# Patient Record
Sex: Female | Born: 1935
Health system: Southern US, Community
[De-identification: ages and names within clinical notes are randomized; demographics above are authoritative.]

## PROBLEM LIST (undated history)

## (undated) DIAGNOSIS — E079 Disorder of thyroid, unspecified: Secondary | ICD-10-CM

## (undated) DIAGNOSIS — T7840XA Allergy, unspecified, initial encounter: Secondary | ICD-10-CM

## (undated) DIAGNOSIS — I10 Essential (primary) hypertension: Secondary | ICD-10-CM

## (undated) DIAGNOSIS — I160 Hypertensive urgency: Secondary | ICD-10-CM

## (undated) DIAGNOSIS — E119 Type 2 diabetes mellitus without complications: Secondary | ICD-10-CM

## (undated) DIAGNOSIS — E785 Hyperlipidemia, unspecified: Secondary | ICD-10-CM

## (undated) HISTORY — DX: Allergy, unspecified, initial encounter: T78.40XA

## (undated) HISTORY — DX: Disorder of thyroid, unspecified: E07.9

## (undated) HISTORY — DX: Hypertensive urgency: I16.0

## (undated) HISTORY — DX: Type 2 diabetes mellitus without complications: E11.9

## (undated) HISTORY — DX: Hyperlipidemia, unspecified: E78.5

## (undated) HISTORY — DX: Essential (primary) hypertension: I10

---

## 2003-03-31 HISTORY — PX: BUNIONECTOMY: SHX129

## 2016-03-10 LAB — LIPID PANEL
Cholesterol: 144 (ref 0–200)
HDL: 49 (ref 35–70)
LDL Cholesterol: 79
Triglycerides: 82 (ref 40–160)

## 2016-03-10 LAB — BASIC METABOLIC PANEL
Creatinine: 0.7 (ref 0.5–1.1)
Glucose: 116
Potassium: 4.6 (ref 3.4–5.3)
Sodium: 145 (ref 137–147)

## 2016-03-10 LAB — CBC AND DIFFERENTIAL
HCT: 42 (ref 36–46)
Hemoglobin: 12.9 (ref 12.0–16.0)
WBC: 4.7

## 2016-03-10 LAB — HEPATIC FUNCTION PANEL
ALT: 20 (ref 7–35)
AST: 28 (ref 13–35)
Alkaline Phosphatase: 115 (ref 25–125)
Bilirubin, Total: 0.7

## 2016-03-10 LAB — HEMOGLOBIN A1C: Hemoglobin A1C: 7

## 2018-05-02 DIAGNOSIS — E559 Vitamin D deficiency, unspecified: Secondary | ICD-10-CM | POA: Diagnosis not present

## 2018-05-02 DIAGNOSIS — I10 Essential (primary) hypertension: Secondary | ICD-10-CM | POA: Diagnosis not present

## 2018-05-02 DIAGNOSIS — E782 Mixed hyperlipidemia: Secondary | ICD-10-CM | POA: Diagnosis not present

## 2018-05-02 DIAGNOSIS — E119 Type 2 diabetes mellitus without complications: Secondary | ICD-10-CM | POA: Diagnosis not present

## 2018-05-06 DIAGNOSIS — E119 Type 2 diabetes mellitus without complications: Secondary | ICD-10-CM | POA: Diagnosis not present

## 2018-05-06 DIAGNOSIS — I10 Essential (primary) hypertension: Secondary | ICD-10-CM | POA: Diagnosis not present

## 2018-05-07 DIAGNOSIS — R69 Illness, unspecified: Secondary | ICD-10-CM | POA: Diagnosis not present

## 2018-08-02 DIAGNOSIS — R69 Illness, unspecified: Secondary | ICD-10-CM | POA: Diagnosis not present

## 2018-09-29 DIAGNOSIS — R69 Illness, unspecified: Secondary | ICD-10-CM | POA: Diagnosis not present

## 2018-12-10 DIAGNOSIS — R69 Illness, unspecified: Secondary | ICD-10-CM | POA: Diagnosis not present

## 2018-12-14 DIAGNOSIS — H35033 Hypertensive retinopathy, bilateral: Secondary | ICD-10-CM | POA: Diagnosis not present

## 2018-12-14 DIAGNOSIS — H2513 Age-related nuclear cataract, bilateral: Secondary | ICD-10-CM | POA: Diagnosis not present

## 2018-12-14 DIAGNOSIS — E113393 Type 2 diabetes mellitus with moderate nonproliferative diabetic retinopathy without macular edema, bilateral: Secondary | ICD-10-CM | POA: Diagnosis not present

## 2018-12-14 DIAGNOSIS — H3589 Other specified retinal disorders: Secondary | ICD-10-CM | POA: Diagnosis not present

## 2018-12-24 ENCOUNTER — Encounter: Payer: Self-pay | Admitting: Internal Medicine

## 2018-12-28 ENCOUNTER — Other Ambulatory Visit: Payer: Self-pay

## 2018-12-28 ENCOUNTER — Encounter: Payer: Self-pay | Admitting: Internal Medicine

## 2018-12-28 ENCOUNTER — Ambulatory Visit (INDEPENDENT_AMBULATORY_CARE_PROVIDER_SITE_OTHER): Payer: Medicare HMO | Admitting: Internal Medicine

## 2018-12-28 VITALS — BP 132/88 | HR 58 | Ht 64.0 in | Wt 161.0 lb

## 2018-12-28 DIAGNOSIS — I10 Essential (primary) hypertension: Secondary | ICD-10-CM | POA: Insufficient documentation

## 2018-12-28 DIAGNOSIS — E559 Vitamin D deficiency, unspecified: Secondary | ICD-10-CM | POA: Diagnosis not present

## 2018-12-28 DIAGNOSIS — E782 Mixed hyperlipidemia: Secondary | ICD-10-CM | POA: Diagnosis not present

## 2018-12-28 DIAGNOSIS — R7303 Prediabetes: Secondary | ICD-10-CM

## 2018-12-28 DIAGNOSIS — E119 Type 2 diabetes mellitus without complications: Secondary | ICD-10-CM | POA: Insufficient documentation

## 2018-12-28 DIAGNOSIS — E1169 Type 2 diabetes mellitus with other specified complication: Secondary | ICD-10-CM | POA: Insufficient documentation

## 2018-12-28 DIAGNOSIS — Z1231 Encounter for screening mammogram for malignant neoplasm of breast: Secondary | ICD-10-CM

## 2018-12-28 DIAGNOSIS — E785 Hyperlipidemia, unspecified: Secondary | ICD-10-CM | POA: Insufficient documentation

## 2018-12-28 DIAGNOSIS — E118 Type 2 diabetes mellitus with unspecified complications: Secondary | ICD-10-CM | POA: Insufficient documentation

## 2018-12-28 DIAGNOSIS — Z23 Encounter for immunization: Secondary | ICD-10-CM

## 2018-12-28 NOTE — Progress Notes (Signed)
Date:  12/28/2018   Name:  Nicci Vaughan   DOB:  1936/01/20   MRN:  [id number]   Chief Complaint: Establish Care (HIGH DOSE FLU SHOT.) and Alopecia (Wants to know if any of her meds can cause hair loss and facial hair growth. )  Hypertension This is a chronic problem. Episode onset: 15 years. The problem is controlled. Pertinent negatives include no chest pain, headaches, orthopnea, palpitations, peripheral edema or shortness of breath. Risk factors for coronary artery disease include dyslipidemia. Past treatments include central alpha agonists, alpha 1 blockers, angiotensin blockers and beta blockers. The current treatment provides significant improvement.  Hyperlipidemia The problem is controlled. Pertinent negatives include no chest pain or shortness of breath. Current antihyperlipidemic treatment includes statins.  Diabetes She presents for her follow-up diabetic visit. Diabetes type: pre-diabetes. Her disease course has been stable. Pertinent negatives for hypoglycemia include no dizziness, headaches or nervousness/anxiousness. Pertinent negatives for diabetes include no chest pain, no fatigue and no weakness. Current diabetic treatment includes oral agent (monotherapy) (metformin). She is following a generally healthy diet. She monitors blood glucose at home 1-2 x per day. Her breakfast blood glucose is taken between 6-7 am. Her breakfast blood glucose range is generally 90-110 mg/dl. Her dinner blood glucose is taken between 7-8 pm. Her dinner blood glucose range is generally 140-180 mg/dl. An ACE inhibitor/angiotensin II receptor blocker is being taken.  Knee Pain  The pain is present in the left knee and right knee. The quality of the pain is described as aching. The pain is moderate. She has tried acetaminophen (and synovisc injections last year) for the symptoms. The treatment provided moderate relief.  Hair growth - she has unwanted hair growth on her chin that has been present for  years.  She wonders if any of her medication is contributing.  She also has thinning on the top of her head but no patchy hair loss.  Review of Systems  Constitutional: Negative for chills, fatigue, fever and unexpected weight change.  HENT: Negative for trouble swallowing.   Eyes: Negative for visual disturbance.  Respiratory: Negative for chest tightness and shortness of breath.   Cardiovascular: Negative for chest pain, palpitations and orthopnea.  Gastrointestinal: Negative for abdominal pain, constipation and diarrhea.  Musculoskeletal: Positive for arthralgias (right knee at times).  Skin: Negative for color change and rash.  Neurological: Negative for dizziness, weakness, light-headedness and headaches.  Psychiatric/Behavioral: Negative for dysphoric mood and sleep disturbance. The patient is not nervous/anxious.     Patient Active Problem List   Diagnosis Date Noted  . Prediabetes 12/28/2018  . Essential hypertension 12/28/2018  . Hyperlipidemia 12/28/2018    No Known Allergies  Past Surgical History:  Procedure Laterality Date  . BUNIONECTOMY Bilateral 2005    Social History   Tobacco Use  . Smoking status: Never Smoker  . Smokeless tobacco: Never Used  Substance Use Topics  . Alcohol use: Never    Frequency: Never  . Drug use: Never     Medication list has been reviewed and updated.  Current Meds  Medication Sig  . aspirin EC 81 MG tablet Take 81 mg by mouth daily.  Marland Kitchen atorvastatin (LIPITOR) 10 MG tablet Take 10 mg by mouth daily.  . calcium-vitamin D (OSCAL WITH D) 500-200 MG-UNIT tablet Take 1 tablet by mouth.  . cloNIDine (CATAPRES) 0.2 MG tablet Take 0.2 mg by mouth 2 (two) times daily.   Marland Kitchen doxazosin (CARDURA) 4 MG tablet Take 4 mg by mouth daily.  Marland Kitchen  Lancets (ONETOUCH DELICA PLUS [id number]) MISC   . Magnesium 500 MG CAPS Take by mouth.  . metFORMIN (GLUCOPHAGE) 500 MG tablet Take 500 mg by mouth 2 (two) times daily with a meal.   . metoprolol  succinate (TOPROL-XL) 100 MG 24 hr tablet Take 100 mg by mouth daily.  Letta Pate VERIO test strip   . telmisartan (MICARDIS) 80 MG tablet Take 80 mg by mouth daily.    PHQ 2/9 Scores 12/28/2018  PHQ - 2 Score 0    BP Readings from Last 3 Encounters:  12/28/18 132/88    Physical Exam Vitals signs and nursing note reviewed.  Constitutional:      General: She is not in acute distress.    Appearance: Normal appearance. She is well-developed.  HENT:     Head: Normocephalic and atraumatic.  Eyes:     Pupils: Pupils are equal, round, and reactive to light.  Neck:     Musculoskeletal: Normal range of motion.     Vascular: No carotid bruit.  Cardiovascular:     Rate and Rhythm: Normal rate and regular rhythm.     Pulses: Normal pulses.     Heart sounds: No murmur.  Pulmonary:     Effort: Pulmonary effort is normal. No respiratory distress.     Breath sounds: Normal breath sounds. No wheezing.  Abdominal:     General: Abdomen is flat. There is no distension.     Palpations: Abdomen is soft. There is no mass.     Tenderness: There is no abdominal tenderness.  Musculoskeletal:     Right knee: She exhibits normal range of motion, no swelling and no effusion.     Left knee: She exhibits normal range of motion, no swelling and no effusion.     Right lower leg: No edema.     Left lower leg: No edema.  Lymphadenopathy:     Cervical: No cervical adenopathy.  Skin:    General: Skin is warm and dry.     Capillary Refill: Capillary refill takes less than 2 seconds.     Findings: No rash.     Comments: Moderate coarse hair growth on chin  Neurological:     General: No focal deficit present.     Mental Status: She is alert and oriented to person, place, and time.     Sensory: Sensation is intact.     Motor: Motor function is intact.     Gait: Gait is intact. Gait normal.  Psychiatric:        Attention and Perception: Attention normal.        Mood and Affect: Mood normal.         Behavior: Behavior normal.        Thought Content: Thought content normal.        Cognition and Memory: Cognition normal.     Wt Readings from Last 3 Encounters:  12/28/18 161 lb (73 kg)    BP 132/88   Pulse (!) 58   Ht 5\' 4"  (1.626 m)   Wt 161 lb (73 kg)   SpO2 98%   BMI 27.64 kg/m   Assessment and Plan: 1. Prediabetes On metformin without side effects A1C never > 6 so pt hold metformin if glucose is < 100 - Hemoglobin A1c  2. Essential hypertension Clinically stable exam with well controlled BP.   Tolerating medications, metoprolol, telmisartan, cardura and clonidine, without side effects at this time. Pt to continue current regimen and low sodium diet; benefits  of regular exercise as able discussed. - CBC with Differential/Platelet - Comprehensive metabolic panel - TSH  3. Mixed hyperlipidemia On moderate intensity statin for primary prevention of CAD Tolerating medication without myalgia, etc - Lipid panel  4. Encounter for screening mammogram for breast cancer [facility] her - MM 3D SCREEN BREAST BILATERAL; Future  5. Vitamin D deficiency Continue supplementation [place]  6. Need for immunization against influenza - Flu Vaccine QUAD High Dose(Fluad)   Partially dictated using Animal nutritionistDragon software. Any errors are unintentional.  Bari EdwardLaura Nadja Lina, MD [facility]  12/28/2018

## 2018-12-29 ENCOUNTER — Encounter: Payer: Self-pay | Admitting: Internal Medicine

## 2018-12-29 LAB — CBC WITH DIFFERENTIAL/PLATELET
Basophils Absolute: 0.1 10*3/uL (ref 0.0–0.2)
Basos: 1 %
EOS (ABSOLUTE): 0.3 10*3/uL (ref 0.0–0.4)
Eos: 5 %
Hematocrit: 37.3 % (ref 34.0–46.6)
Hemoglobin: 12.5 g/dL (ref 11.1–15.9)
Immature Grans (Abs): 0 10*3/uL (ref 0.0–0.1)
Immature Granulocytes: 0 %
Lymphocytes Absolute: 2 10*3/uL (ref 0.7–3.1)
Lymphs: 33 %
MCH: 29.3 pg (ref 26.6–33.0)
MCHC: 33.5 g/dL (ref 31.5–35.7)
MCV: 87 fL (ref 79–97)
Monocytes Absolute: 0.5 10*3/uL (ref 0.1–0.9)
Monocytes: 8 %
Neutrophils Absolute: 3.2 10*3/uL (ref 1.4–7.0)
Neutrophils: 53 %
Platelets: 169 10*3/uL (ref 150–450)
RBC: 4.27 x10E6/uL (ref 3.77–5.28)
RDW: 13.1 % (ref 11.7–15.4)
WBC: 6.1 10*3/uL (ref 3.4–10.8)

## 2018-12-29 LAB — LIPID PANEL
Chol/HDL Ratio: 2.8 ratio (ref 0.0–4.4)
Cholesterol, Total: 152 mg/dL (ref 100–199)
HDL: 54 mg/dL (ref >39)
LDL Chol Calc (NIH): 84 mg/dL (ref 0–99)
Triglycerides: 74 mg/dL (ref 0–149)
VLDL Cholesterol Cal: 14 mg/dL (ref 5–40)

## 2018-12-29 LAB — COMPREHENSIVE METABOLIC PANEL
ALT: 5 IU/L (ref 0–32)
AST: 18 IU/L (ref 0–40)
Albumin/Globulin Ratio: 1.2 (ref 1.2–2.2)
Albumin: 4.5 g/dL (ref 3.6–4.6)
Alkaline Phosphatase: 87 IU/L (ref 39–117)
BUN/Creatinine Ratio: 14 (ref 12–28)
BUN: 14 mg/dL (ref 8–27)
Bilirubin Total: 0.6 mg/dL (ref 0.0–1.2)
CO2: 27 mmol/L (ref 20–29)
Calcium: 9.8 mg/dL (ref 8.7–10.3)
Chloride: 99 mmol/L (ref 96–106)
Creatinine, Ser: 1.01 mg/dL — ABNORMAL HIGH (ref 0.57–1.00)
GFR calc Af Amer: 60 mL/min/{1.73_m2} (ref >59)
GFR calc non Af Amer: 52 mL/min/{1.73_m2} — ABNORMAL LOW (ref >59)
Globulin, Total: 3.7 g/dL (ref 1.5–4.5)
Glucose: 110 mg/dL — ABNORMAL HIGH (ref 65–99)
Potassium: 4.4 mmol/L (ref 3.5–5.2)
Sodium: 139 mmol/L (ref 134–144)
Total Protein: 8.2 g/dL (ref 6.0–8.5)

## 2018-12-29 LAB — HEMOGLOBIN A1C
Est. average glucose Bld gHb Est-mCnc: 151 mg/dL
Hgb A1c MFr Bld: 6.9 % — ABNORMAL HIGH (ref 4.8–5.6)

## 2018-12-29 LAB — TSH: TSH: 1.9 u[IU]/mL (ref 0.450–4.500)

## 2019-02-01 ENCOUNTER — Inpatient Hospital Stay: Admission: RE | Admit: 2019-02-01 | Payer: Medicare HMO | Source: Ambulatory Visit

## 2019-06-27 ENCOUNTER — Ambulatory Visit (INDEPENDENT_AMBULATORY_CARE_PROVIDER_SITE_OTHER): Payer: Medicare HMO | Admitting: Internal Medicine

## 2019-06-27 ENCOUNTER — Encounter: Payer: Self-pay | Admitting: Internal Medicine

## 2019-06-27 ENCOUNTER — Other Ambulatory Visit: Payer: Self-pay

## 2019-06-27 VITALS — BP 142/98 | HR 51 | Temp 97.1°F | Ht 64.0 in | Wt 156.0 lb

## 2019-06-27 DIAGNOSIS — E782 Mixed hyperlipidemia: Secondary | ICD-10-CM

## 2019-06-27 DIAGNOSIS — Z0000 Encounter for general adult medical examination without abnormal findings: Secondary | ICD-10-CM

## 2019-06-27 DIAGNOSIS — Z1231 Encounter for screening mammogram for malignant neoplasm of breast: Secondary | ICD-10-CM | POA: Diagnosis not present

## 2019-06-27 DIAGNOSIS — E118 Type 2 diabetes mellitus with unspecified complications: Secondary | ICD-10-CM | POA: Diagnosis not present

## 2019-06-27 DIAGNOSIS — Z13820 Encounter for screening for osteoporosis: Secondary | ICD-10-CM

## 2019-06-27 DIAGNOSIS — I10 Essential (primary) hypertension: Secondary | ICD-10-CM

## 2019-06-27 LAB — POCT URINALYSIS DIPSTICK
Bilirubin, UA: NEGATIVE
Blood, UA: NEGATIVE
Glucose, UA: NEGATIVE
Ketones, UA: NEGATIVE
Nitrite, UA: NEGATIVE
Protein, UA: NEGATIVE
Spec Grav, UA: 1.02 (ref 1.010–1.025)
Urobilinogen, UA: 0.2 E.U./dL
pH, UA: 6 (ref 5.0–8.0)

## 2019-06-27 MED ORDER — ATORVASTATIN CALCIUM 10 MG PO TABS: 10.0000 mg | ORAL_TABLET | Freq: Every day | ORAL | 1 refills | Status: DC

## 2019-06-27 MED ORDER — CLONIDINE HCL 0.2 MG PO TABS: 0.2000 mg | ORAL_TABLET | Freq: Three times a day (TID) | ORAL | 1 refills | Status: DC

## 2019-06-27 MED ORDER — TELMISARTAN 80 MG PO TABS: 80.0000 mg | ORAL_TABLET | Freq: Every day | ORAL | 1 refills | Status: DC

## 2019-06-27 MED ORDER — METOPROLOL SUCCINATE ER 100 MG PO TB24: 100.0000 mg | ORAL_TABLET | Freq: Every day | ORAL | 1 refills | Status: DC

## 2019-06-27 NOTE — Progress Notes (Signed)
Date:  06/27/2019   Name:  Amber Padilla   DOB:  [birth year]   MRN:  [id number]   Chief Complaint: Annual Exam (declined breast exam- no pap- foot exam- med refill), Hypertension (last reading 144/81 Needs meds refilled. Says Doxazosine causes dry mouth/tongue, unable to speak clearly. Wants to know if this can be changed?), Diabetes (follow up ), and Hyperlipidemia (Med refill.) Amber Padilla is a 84 y.o. female who presents today for her Complete Annual Exam. She feels fairly well. She reports exercising walking some. She reports she is sleeping fairly well. She denies breast issues and declines breast exam.  Mammogram 2017 - report in chart Does not want any further. DEXA - pt thinks its almost due Colonoscopy - aged out Immunization History  Administered Date(s) Administered  . Fluad Quad(high Dose 65+) 12/28/2018  . PFIZER SARS-COV-2 Vaccination 05/25/2019, 06/15/2019    Hypertension This is a chronic problem. The problem is resistant (at home 144/81.). Pertinent negatives include no chest pain, headaches, palpitations or shortness of breath. Past treatments include angiotensin blockers, beta blockers, central alpha agonists and direct vasodilators. The current treatment provides significant improvement. There are no compliance problems (she thinks that Cardura causes dry mouth).   Diabetes She presents for her follow-up diabetic visit. She has type 2 diabetes mellitus. Her disease course has been stable. Pertinent negatives for hypoglycemia include no dizziness, headaches, nervousness/anxiousness or tremors. Pertinent negatives for diabetes include no chest pain, no fatigue, no polydipsia and no polyuria. Symptoms are stable. Current diabetic treatment includes oral agent (monotherapy). She is compliant with treatment all of the time. Her breakfast blood glucose is taken between 6-7 am. Her breakfast blood glucose range is generally 90-110 mg/dl. An ACE inhibitor/angiotensin II receptor  blocker is being taken.  Hyperlipidemia This is a chronic problem. The problem is controlled. Pertinent negatives include no chest pain or shortness of breath. Current antihyperlipidemic treatment includes statins. The current treatment provides significant improvement of lipids. Risk factors for coronary artery disease include diabetes mellitus and dyslipidemia.    Lab Results  Component Value Date   CREATININE 1.01 (H) 12/28/2018   BUN 14 12/28/2018   NA 139 12/28/2018   K 4.4 12/28/2018   CL 99 12/28/2018   CO2 27 12/28/2018   Lab Results  Component Value Date   CHOL 152 12/28/2018   HDL 54 12/28/2018   LDLCALC 84 12/28/2018   TRIG 74 12/28/2018   CHOLHDL 2.8 12/28/2018   Lab Results  Component Value Date   TSH 1.900 12/28/2018   Lab Results  Component Value Date   HGBA1C 6.9 (H) 12/28/2018   Lab Results  Component Value Date   WBC 6.1 12/28/2018   HGB 12.5 12/28/2018   HCT 37.3 12/28/2018   MCV 87 12/28/2018   PLT 169 12/28/2018   Lab Results  Component Value Date   ALT 5 12/28/2018   AST 18 12/28/2018   ALKPHOS 87 12/28/2018   BILITOT 0.6 12/28/2018     Review of Systems  Constitutional: Negative for chills, fatigue and fever.  HENT: Negative for congestion, hearing loss, tinnitus, trouble swallowing and voice change.   Eyes: Negative for visual disturbance.  Respiratory: Negative for cough, chest tightness, shortness of breath and wheezing.   Cardiovascular: Negative for chest pain, palpitations and leg swelling.  Gastrointestinal: Positive for vomiting. Negative for abdominal pain, constipation and diarrhea.  Endocrine: Negative for polydipsia and polyuria.  Genitourinary: Positive for frequency (at night). Negative for dysuria and genital sores.  Musculoskeletal: Negative for arthralgias, gait problem and joint swelling.  Skin: Negative for color change and rash.  Neurological: Negative for dizziness, tremors, light-headedness and headaches.    Hematological: Negative for adenopathy. Does not bruise/bleed easily.  Psychiatric/Behavioral: Negative for dysphoric mood and sleep disturbance. The patient is not nervous/anxious.     Patient Active Problem List   Diagnosis Date Noted  . Type II diabetes mellitus with complication ([place]) 97/98/9211  . Essential hypertension 12/28/2018  . Hyperlipidemia 12/28/2018    Allergies  Allergen Reactions  . Cardura [Doxazosin] Other (See Comments)    Dry mouth  . Norvasc [Amlodipine] Swelling    Past Surgical History:  Procedure Laterality Date  . BUNIONECTOMY Bilateral 2005    Social History   Tobacco Use  . Smoking status: Never Smoker  . Smokeless tobacco: Never Used  Substance Use Topics  . Alcohol use: Never  . Drug use: Never     Medication list has been reviewed and updated.  Current Meds  Medication Sig  . aspirin EC 81 MG tablet Take 81 mg by mouth daily.  Marland Kitchen atorvastatin (LIPITOR) 10 MG tablet Take 1 tablet (10 mg total) by mouth daily.  . calcium-vitamin D (OSCAL WITH D) 500-200 MG-UNIT tablet Take 1 tablet by mouth.  . cloNIDine (CATAPRES) 0.2 MG tablet Take 1 tablet (0.2 mg total) by mouth 3 (three) times daily.  . Lancets (ONETOUCH DELICA PLUS [id number]) [place]   . Magnesium 500 MG CAPS Take by mouth.  . metFORMIN (GLUCOPHAGE) 500 MG tablet Take 500 mg by mouth 2 (two) times daily with a meal.   . metoprolol succinate (TOPROL-XL) 100 MG 24 hr tablet Take 1 tablet (100 mg total) by mouth daily.  Glory Rosebush VERIO test strip   . telmisartan (MICARDIS) 80 MG tablet Take 1 tablet (80 mg total) by mouth daily.  . [DISCONTINUED] atorvastatin (LIPITOR) 10 MG tablet Take 10 mg by mouth daily.  . [DISCONTINUED] cloNIDine (CATAPRES) 0.2 MG tablet Take 0.2 mg by mouth 2 (two) times daily.   . [DISCONTINUED] doxazosin (CARDURA) 4 MG tablet Take 4 mg by mouth daily.  . [DISCONTINUED] metoprolol succinate (TOPROL-XL) 100 MG 24 hr tablet Take 100 mg by mouth daily.  .  [DISCONTINUED] telmisartan (MICARDIS) 80 MG tablet Take 80 mg by mouth daily.    PHQ 2/9 Scores 06/27/2019 12/28/2018  PHQ - 2 Score 0 0  PHQ- 9 Score 0 -    BP Readings from Last 3 Encounters:  06/27/19 (!) 142/98  12/28/18 132/88    Physical Exam Vitals and nursing note reviewed.  Constitutional:      General: She is not in acute distress.    Appearance: She is well-developed.  HENT:     Head: Normocephalic and atraumatic.     Right Ear: Tympanic membrane and ear canal normal.     Left Ear: Tympanic membrane and ear canal normal.     Nose:     Right Sinus: No maxillary sinus tenderness.     Left Sinus: No maxillary sinus tenderness.  Eyes:     General: No scleral icterus.       Right eye: No discharge.        Left eye: No discharge.     Conjunctiva/sclera: Conjunctivae normal.  Neck:     Thyroid: No thyromegaly.     Vascular: No carotid bruit.  Cardiovascular:     Rate and Rhythm: Normal rate and regular rhythm.     Pulses: Normal pulses.  Heart sounds: Normal heart sounds.  Pulmonary:     Effort: Pulmonary effort is normal. No respiratory distress.     Breath sounds: No wheezing.  Abdominal:     General: Bowel sounds are normal.     Palpations: Abdomen is soft.     Tenderness: There is no abdominal tenderness.  Musculoskeletal:        General: Normal range of motion.     Cervical back: Normal range of motion. No erythema.  Lymphadenopathy:     Cervical: No cervical adenopathy.  Skin:    General: Skin is warm and dry.     Findings: No rash.  Neurological:     Mental Status: She is alert and oriented to person, place, and time.     Cranial Nerves: No cranial nerve deficit.     Sensory: No sensory deficit.     Deep Tendon Reflexes: Reflexes are normal and symmetric.  Psychiatric:        Speech: Speech normal.        Behavior: Behavior normal.        Thought Content: Thought content normal.     Wt Readings from Last 3 Encounters:  06/27/19 156 lb (70.8  kg)  12/28/18 161 lb (73 kg)    BP (!) 142/98   Pulse (!) 51   Temp (!) 97.1 F (36.2 C) (Temporal)   Ht 5\' 4"  (1.626 m)   Wt 156 lb (70.8 kg)   SpO2 98%   BMI 26.78 kg/m   Assessment and Plan: 1. Annual physical exam Normal exam Continue healthy diet, exercise Pt will also look for immunization records so we can update - POCT urinalysis dipstick  2. Encounter for screening mammogram for breast cancer Pt declines breast exam and mammogram She continues to perform self exams and feels comfortable doing that  3. Essential hypertension BP is not controlled,even at home systolic is >140 Discontinue Cardura due to side effect of dry mouth Increase clonidine to tid - cloNIDine (CATAPRES) 0.2 MG tablet; Take 1 tablet (0.2 mg total) by mouth 3 (three) times daily.  Dispense: 270 tablet; Refill: 1 - telmisartan (MICARDIS) 80 MG tablet; Take 1 tablet (80 mg total) by mouth daily.  Dispense: 90 tablet; Refill: 1 - metoprolol succinate (TOPROL-XL) 100 MG 24 hr tablet; Take 1 tablet (100 mg total) by mouth daily.  Dispense: 90 tablet; Refill: 1 - CBC with Differential/Platelet - TSH  4. Type II diabetes mellitus with complication (HCC) Clinically stable by exam and report without s/s of hypoglycemia. DM complicated by HTN. Tolerating medications metformin bid well without side effects or other concerns. - Comprehensive metabolic panel - Hemoglobin A1c  5. Mixed hyperlipidemia Tolerating statin medication without side effects at this time LDL is almost at goal of < 70 on current dose Continue same therapy without change at this time. - atorvastatin (LIPITOR) 10 MG tablet; Take 1 tablet (10 mg total) by mouth daily.  Dispense: 90 tablet; Refill: 1  6. Encounter for screening for osteoporosis Pt states last DEXA about 18 mo ago and showed improvement She will call her last PCP and ask for records and check at home for copies she may have   Partially dictated using . Any errors are unintentional.  Barista, MD [facility]  06/27/2019

## 2019-06-27 NOTE — Patient Instructions (Addendum)
Increase the Clonidine to three times a day - try to space it as evenly as possible. Stop Cardura  Call your old office and ask for:  Last Bone density Last mammogram Vaccine information     Shingles     Flu     Prevnar-13     Pneumococcal 23

## 2019-06-28 LAB — CBC WITH DIFFERENTIAL/PLATELET
Basophils Absolute: 0 10*3/uL (ref 0.0–0.2)
Basos: 1 %
EOS (ABSOLUTE): 0.4 10*3/uL (ref 0.0–0.4)
Eos: 8 %
Hematocrit: 37.8 % (ref 34.0–46.6)
Hemoglobin: 12.3 g/dL (ref 11.1–15.9)
Immature Grans (Abs): 0 10*3/uL (ref 0.0–0.1)
Immature Granulocytes: 0 %
Lymphocytes Absolute: 1.7 10*3/uL (ref 0.7–3.1)
Lymphs: 31 %
MCH: 29.8 pg (ref 26.6–33.0)
MCHC: 32.5 g/dL (ref 31.5–35.7)
MCV: 92 fL (ref 79–97)
Monocytes Absolute: 0.5 10*3/uL (ref 0.1–0.9)
Monocytes: 9 %
Neutrophils Absolute: 2.7 10*3/uL (ref 1.4–7.0)
Neutrophils: 51 %
Platelets: 175 10*3/uL (ref 150–450)
RBC: 4.13 x10E6/uL (ref 3.77–5.28)
RDW: 13.4 % (ref 11.7–15.4)
WBC: 5.3 10*3/uL (ref 3.4–10.8)

## 2019-06-28 LAB — TSH: TSH: 2.33 u[IU]/mL (ref 0.450–4.500)

## 2019-06-28 LAB — COMPREHENSIVE METABOLIC PANEL
ALT: 8 IU/L (ref 0–32)
AST: 18 IU/L (ref 0–40)
Albumin/Globulin Ratio: 1.1 — ABNORMAL LOW (ref 1.2–2.2)
Albumin: 4.1 g/dL (ref 3.6–4.6)
Alkaline Phosphatase: 81 IU/L (ref 39–117)
BUN/Creatinine Ratio: 16 (ref 12–28)
BUN: 14 mg/dL (ref 8–27)
Bilirubin Total: 0.6 mg/dL (ref 0.0–1.2)
CO2: 27 mmol/L (ref 20–29)
Calcium: 9.6 mg/dL (ref 8.7–10.3)
Chloride: 100 mmol/L (ref 96–106)
Creatinine, Ser: 0.85 mg/dL (ref 0.57–1.00)
GFR calc Af Amer: 74 mL/min/{1.73_m2} (ref >59)
GFR calc non Af Amer: 64 mL/min/{1.73_m2} (ref >59)
Globulin, Total: 3.8 g/dL (ref 1.5–4.5)
Glucose: 118 mg/dL — ABNORMAL HIGH (ref 65–99)
Potassium: 4.4 mmol/L (ref 3.5–5.2)
Sodium: 136 mmol/L (ref 134–144)
Total Protein: 7.9 g/dL (ref 6.0–8.5)

## 2019-06-28 LAB — HEMOGLOBIN A1C
Est. average glucose Bld gHb Est-mCnc: 140 mg/dL
Hgb A1c MFr Bld: 6.5 % — ABNORMAL HIGH (ref 4.8–5.6)

## 2019-07-07 ENCOUNTER — Other Ambulatory Visit: Payer: Self-pay | Admitting: Internal Medicine

## 2019-07-25 LAB — HM DIABETES EYE EXAM

## 2019-08-06 ENCOUNTER — Encounter: Payer: Self-pay | Admitting: Emergency Medicine

## 2019-08-06 ENCOUNTER — Ambulatory Visit
Admission: EM | Admit: 2019-08-06 | Discharge: 2019-08-06 | Disposition: A | Discharge status: Home / Self Care | Payer: Medicare HMO | Attending: Family Medicine | Admitting: Family Medicine

## 2019-08-06 ENCOUNTER — Other Ambulatory Visit: Payer: Self-pay

## 2019-08-06 DIAGNOSIS — H65192 Other acute nonsuppurative otitis media, left ear: Secondary | ICD-10-CM

## 2019-08-06 DIAGNOSIS — J029 Acute pharyngitis, unspecified: Secondary | ICD-10-CM

## 2019-08-06 MED ORDER — AMOXICILLIN-POT CLAVULANATE 875-125 MG PO TABS: 1.0000 | ORAL_TABLET | Freq: Two times a day (BID) | ORAL | 0 refills | Status: DC

## 2019-08-06 NOTE — ED Provider Notes (Signed)
[facility]    CSN: [id number] Arrival date & time: 08/06/19  0917      History   Chief Complaint Chief Complaint  Patient presents with  . Otalgia   HPI  84 year old female presents with ear pain, runny nose, and sore throat.  Started on Monday.  She reports bilateral ear pain but left greater than right.  Also reports runny nose and severe sore throat.  Interfering with sleep.  Rates her pain as 10/10 in severity.  Blood pressure markedly elevated.  She states that this is due to pain.  No relieving factors.  No reported sick contacts.  No other complaints at this time.  Past Medical History:  Diagnosis Date  . Allergy   . Diabetes mellitus without complication ([place])   . Hyperlipidemia   . Hypertension   . Thyroid disease     Patient Active Problem List   Diagnosis Date Noted  . Type II diabetes mellitus with complication ([place]) 78/29/5621  . Essential hypertension 12/28/2018  . Hyperlipidemia 12/28/2018    Past Surgical History:  Procedure Laterality Date  . BUNIONECTOMY Bilateral 2005    OB History   No obstetric history on file.      Home Medications    Prior to Admission medications   Medication Sig Start Date End Date Taking? Authorizing Provider  aspirin EC 81 MG tablet Take 81 mg by mouth daily.   Yes [provider]  atorvastatin (LIPITOR) 10 MG tablet Take 1 tablet (10 mg total) by mouth daily. 06/27/19  Yes Glean Hess, MD  calcium-vitamin D (OSCAL WITH D) 500-200 MG-UNIT tablet Take 1 tablet by mouth.   Yes [provider]  cloNIDine (CATAPRES) 0.2 MG tablet Take 1 tablet (0.2 mg total) by mouth 3 (three) times daily. 06/27/19  Yes Glean Hess, MD  Magnesium 500 MG CAPS Take by mouth.   Yes [provider]  metFORMIN (GLUCOPHAGE) 500 MG tablet Take 500 mg by mouth 2 (two) times daily with a meal.  09/29/18  Yes [provider]  metoprolol succinate (TOPROL-XL) 100 MG 24 hr tablet Take 1 tablet  (100 mg total) by mouth daily. 06/27/19  Yes Glean Hess, MD  montelukast (SINGULAIR) 10 MG tablet  02/20/19  Yes [provider]  telmisartan (MICARDIS) 80 MG tablet Take 1 tablet (80 mg total) by mouth daily. 06/27/19  Yes Glean Hess, MD  amoxicillin-clavulanate (AUGMENTIN) 875-125 MG tablet Take 1 tablet by mouth 2 (two) times daily. 08/06/19   Coral Spikes, DO  Lancets (ONETOUCH DELICA PLUS [id number]) [place]  12/10/18   [provider]  Roma Schanz test strip  12/10/18   [provider]    Family History Family History  Problem Relation Age of Onset  . Diabetes Mother     Social History Social History   Tobacco Use  . Smoking status: Never Smoker  . Smokeless tobacco: Never Used  Substance Use Topics  . Alcohol use: Never  . Drug use: Never     Allergies   Cardura [doxazosin] and Norvasc [amlodipine]   Review of Systems Review of Systems  Constitutional: Negative for fever.  HENT: Positive for ear pain, rhinorrhea and sore throat.    Physical Exam Triage Vital Signs ED Triage Vitals  Enc Vitals Group     BP 08/06/19 0927 (S) (!) 219/98     Pulse Rate 08/06/19 0927 60     Resp 08/06/19 0927 14     Temp  08/06/19 0927 98.8 F (37.1 C)     Temp Source 08/06/19 0927 Oral     SpO2 08/06/19 0927 96 %     Weight 08/06/19 0924 156 lb (70.8 kg)     Height 08/06/19 0924 5\' 4"  (1.626 m)     Head Circumference --      Peak Flow --      Pain Score 08/06/19 0923 10     Pain Loc --      Pain Edu? --      Excl. in GC? --    Updated Vital Signs BP (S) (!) 219/98 (BP Location: Left Arm)   Pulse 60   Temp 98.8 F (37.1 C) (Oral)   Resp 14   Ht 5\' 4"  (1.626 m)   Wt 70.8 kg   SpO2 96%   BMI 26.78 kg/m   Visual Acuity Right Eye Distance:   Left Eye Distance:   Bilateral Distance:    Right Eye Near:   Left Eye Near:    Bilateral Near:     Physical Exam Vitals and nursing note reviewed.  Constitutional:      General: She is  not in acute distress.    Appearance: Normal appearance. She is not ill-appearing.  HENT:     Ears:     Comments: Left TM with effusion.    Mouth/Throat:     Pharynx: Posterior oropharyngeal erythema present. No oropharyngeal exudate.  Eyes:     General:        Right eye: No discharge.        Left eye: No discharge.     Conjunctiva/sclera: Conjunctivae normal.  Cardiovascular:     Rate and Rhythm: Normal rate and regular rhythm.  Pulmonary:     Effort: Pulmonary effort is normal.     Breath sounds: Normal breath sounds.  Neurological:     Mental Status: She is alert.  Psychiatric:        Mood and Affect: Mood normal.        Behavior: Behavior normal.    UC Treatments / Results  Labs (all labs ordered are listed, but only abnormal results are displayed) Labs Reviewed - No data to display  EKG   Radiology No results found.  Procedures Procedures (including critical care time)  Medications Ordered in UC Medications - No data to display  Initial Impression / Assessment and Plan / UC Course  I have reviewed the triage vital signs and the nursing notes.  Pertinent labs & imaging results that were available during my care of the patient were reviewed by me and considered in my medical decision making (see chart for details).    84 year old female presents with left ear effusion and pharyngitis.  Treating with Augmentin.  Final Clinical Impressions(s) / UC Diagnoses   Final diagnoses:  Acute effusion of left ear  Acute pharyngitis, unspecified etiology     Discharge Instructions     Antibiotic as prescribed.  Follow up with your PCP.  Take care  Dr.    ED Prescriptions    Medication Sig Dispense Auth. Provider   amoxicillin-clavulanate (AUGMENTIN) 875-125 MG tablet Take 1 tablet by mouth 2 (two) times daily. 20 tablet 94, DO     PDMP not reviewed this encounter.   Adriana Simas, DO 08/06/19 1124

## 2019-08-06 NOTE — ED Triage Notes (Signed)
Patient c/o bilateral ear pain since Monday. Patient also reports runny nose.

## 2019-08-06 NOTE — Discharge Instructions (Addendum)
Antibiotic as prescribed.  Follow up with your PCP.  Take care  Dr. Alisha Burgo  

## 2019-11-13 ENCOUNTER — Ambulatory Visit: Payer: Medicare HMO | Admitting: Internal Medicine

## 2019-12-06 ENCOUNTER — Ambulatory Visit (INDEPENDENT_AMBULATORY_CARE_PROVIDER_SITE_OTHER): Payer: Medicare HMO

## 2019-12-06 DIAGNOSIS — Z0000 Encounter for general adult medical examination without abnormal findings: Secondary | ICD-10-CM

## 2019-12-06 NOTE — Progress Notes (Signed)
Subjective:   Amber Padilla is a 84 y.o. female who presents for Medicare Annual (Subsequent) preventive examination.  Virtual Visit via Telephone Note  I connected with  Amber Padilla on 12/06/19 at 10:40 AM EDT by telephone and verified that I am speaking with the correct person using two identifiers.  Medicare Annual Wellness visit completed telephonically due to Covid-19 pandemic.   Location: Patient: home Provider: [facility]   I discussed the limitations, risks, security and privacy concerns of performing an evaluation and management service by telephone and the availability of in person appointments. The patient expressed understanding and agreed to proceed.  Unable to perform video visit due to video visit attempted and failed and/or patient does not have video capability.   Some vital signs may be absent or patient reported.   Amber Littler, LPN    Review of Systems     Cardiac Risk Factors include: diabetes mellitus;dyslipidemia;hypertension     Objective:    Today's Vitals   12/06/19 1046  PainSc: 4    There is no height or weight on file to calculate BMI.  Advanced Directives 08/06/2019  Does Patient Have a Medical Advance Directive? Yes  Type of Estate agent of [place];Living will    Current Medications (verified) Outpatient Encounter Medications as of 12/06/2019  Medication Sig  . aspirin EC 81 MG tablet Take 81 mg by mouth daily.  . calcium-vitamin D (OSCAL WITH D) 500-200 MG-UNIT tablet Take 1 tablet by mouth.  . cloNIDine (CATAPRES) 0.2 MG tablet Take 1 tablet (0.2 mg total) by mouth 3 (three) times daily.  . Lancets (ONETOUCH DELICA PLUS LANCET30G) MISC   . Magnesium 500 MG CAPS Take by mouth.  . metFORMIN (GLUCOPHAGE) 500 MG tablet Take 500 mg by mouth 2 (two) times daily with a meal.   . metoprolol succinate (TOPROL-XL) 100 MG 24 hr tablet Take 1 tablet (100 mg total) by mouth daily.  Letta Pate VERIO test strip   . telmisartan  (MICARDIS) 80 MG tablet Take 1 tablet (80 mg total) by mouth daily.  . [DISCONTINUED] amoxicillin-clavulanate (AUGMENTIN) 875-125 MG tablet Take 1 tablet by mouth 2 (two) times daily.  . [DISCONTINUED] atorvastatin (LIPITOR) 10 MG tablet Take 1 tablet (10 mg total) by mouth daily.  . [DISCONTINUED] montelukast (SINGULAIR) 10 MG tablet    No facility-administered encounter medications on file as of 12/06/2019.    Allergies (verified) Cardura [doxazosin] and Norvasc [amlodipine]   History: Past Medical History:  Diagnosis Date  . Allergy   . Diabetes mellitus without complication (HCC)   . Hyperlipidemia   . Hypertension   . Thyroid disease    Past Surgical History:  Procedure Laterality Date  . BUNIONECTOMY Bilateral 2005   Family History  Problem Relation Age of Onset  . Diabetes Mother    Social History   Socioeconomic History  . Marital status: Single    Spouse name: Not on file  . Number of children: 1  . Years of education: Not on file  . Highest education level: Not on file  Occupational History  . Not on file  Tobacco Use  . Smoking status: Never Smoker  . Smokeless tobacco: Never Used  Vaping Use  . Vaping Use: Never used  Substance and Sexual Activity  . Alcohol use: Never  . Drug use: Never  . Sexual activity: Not Currently  Other Topics Concern  . Not on file  Social History Narrative  . Not on file   Social Determinants  of Health   Financial Resource Strain: Low Risk   . Difficulty of Paying Living Expenses: Not hard at all  Food Insecurity: No Food Insecurity  . Worried About Programme researcher, broadcasting/film/video in the Last Year: Never true  . Ran Out of Food in the Last Year: Never true  Transportation Needs: No Transportation Needs  . Lack of Transportation (Medical): No  . Lack of Transportation (Non-Medical): No  Physical Activity: Inactive  . Days of Exercise per Week: 0 days  . Minutes of Exercise per Session: 0 min  Stress: No Stress Concern Present    . Feeling of Stress : Not at all  Social Connections: Moderately Isolated  . Frequency of Communication with Friends and Family: More than three times a week  . Frequency of Social Gatherings with Friends and Family: Three times a week  . Attends Religious Services: More than 4 times per year  . Active Member of Clubs or Organizations: No  . Attends Banker Meetings: Never  . Marital Status: Never married    Tobacco Counseling Counseling given: Not Answered   Clinical Intake:  Pre-visit preparation completed: Yes  Pain : 0-10 Pain Score: 4  Pain Type: Acute pain Pain Location: Teeth Pain Descriptors / Indicators: Aching Pain Onset: In the past 7 days Pain Frequency: Intermittent     Nutritional Risks: None Diabetes: Yes CBG done?: No Did pt. bring in CBG monitor from home?: No  How often do you need to have someone help you when you read instructions, pamphlets, or other written materials from your doctor or pharmacy?: 1 - Never  Nutrition Risk Assessment:  Has the patient had any N/V/D within the last 2 months?  No  Does the patient have any non-healing wounds?  No  Has the patient had any unintentional weight loss or weight gain?  No   Diabetes:  Is the patient diabetic?  Yes  If diabetic, was a CBG obtained today?  No  Did the patient bring in their glucometer from home?  No  How often do you monitor your CBG's? Every so often per patient.   Financial Strains and Diabetes Management:  Are you having any financial strains with the device, your supplies or your medication? No .  Does the patient want to be seen by Chronic Care Management for management of their diabetes?  No  Would the patient like to be referred to a Nutritionist or for Diabetic Management?  No   Diabetic Exams:  Diabetic Eye Exam: Completed 07/25/19 negative retinopathy.    Diabetic Foot Exam: Completed 06/27/19.    Interpreter Needed?: No  Information entered by :: Amber Littler LPN   Activities of Daily Living In your present state of health, do you have any difficulty performing the following activities: 12/06/2019 12/28/2018  Hearing? N N  Comment declines hearing aids -  Vision? N N  Difficulty concentrating or making decisions? N N  Walking or climbing stairs? N N  Dressing or bathing? N N  Doing errands, shopping? N N  Preparing Food and eating ? N -  Using the Toilet? N -  In the past six months, have you accidently leaked urine? N -  Do you have problems with loss of bowel control? N -  Managing your Medications? N -  Managing your Finances? N -  Housekeeping or managing your Housekeeping? N -  Some recent data might be hidden    Patient Care Team: Reubin Milan, MD as  PCP - General (Internal Medicine)  Indicate any recent Medical Services you may have received from other than [facility] providers in the past year (date may be approximate).     Assessment:   This is a routine wellness examination for Amber Padilla.  Hearing/Vision screen  Hearing Screening   125Hz  250Hz  500Hz  1000Hz  2000Hz  3000Hz  4000Hz  6000Hz  8000Hz   Right ear:           Left ear:           Comments: Pt denies hearing difficulty  Vision Screening Comments: [facility]  Dietary issues and exercise activities discussed: Current Exercise Habits: The patient does not participate in regular exercise at present, Exercise limited by: None identified  Goals    . Patient Stated     Patient states she would like to maintain A1c and continue to keep weight off that she has lost.       Depression Screen PHQ 2/9 Scores 12/06/2019 06/27/2019 12/28/2018  PHQ - 2 Score 0 0 0  PHQ- 9 Score - 0 -    Fall Risk Fall Risk  12/06/2019 06/27/2019 12/28/2018  Falls in the past year? 0 0 0  Number falls in past yr: 0 - 0  Injury with Fall? 0 0 0  Risk for fall due to : No Fall Risks No Fall Risks -  Follow up Falls prevention discussed Falls evaluation  completed Falls evaluation completed    Any stairs in or around the home? No  If so, are there any without handrails? No  Home free of loose throw rugs in walkways, pet beds, electrical cords, etc? Yes  Adequate lighting in your home to reduce risk of falls? Yes   ASSISTIVE DEVICES UTILIZED TO PREVENT FALLS:  Life alert? No  Use of a cane, walker or w/c? No  Grab bars in the bathroom? No  Shower chair or bench in shower? No  Elevated toilet seat or a handicapped toilet? Yes   TIMED UP AND GO:  Was the test performed? No . Telephonic visit.   Cognitive Function:     6CIT Screen 12/06/2019  What Year? 0 points  What month? 0 points  What time? 0 points  Count back from 20 0 points  Months in reverse 0 points  Repeat phrase 0 points  Total Score 0    Immunizations Immunization History  Administered Date(s) Administered  . Fluad Quad(high Dose 65+) 12/28/2018  . PFIZER SARS-COV-2 Vaccination 05/25/2019, 06/15/2019  . Pneumococcal Conjugate-13 01/23/2014  . Pneumococcal Polysaccharide-23 12/27/2012    TDAP status: Up to date    Flu vaccine status: patient plans to get at next office visit  Pneumococcal vaccine status: Up to date   Covid-19 vaccine status: Completed vaccines  Qualifies for Shingles Vaccine? Yes   Zostavax completed Yes   Shingrix Completed?: Yes pt advised to bring vaccine record to appt  Screening Tests Health Maintenance  Topic Date Due  . INFLUENZA VACCINE  10/29/2019  . HEMOGLOBIN A1C  12/28/2019  . FOOT EXAM  06/26/2020  . TETANUS/TDAP  08/02/2023  . DEXA SCAN  Completed  . COVID-19 Vaccine  Completed  . OPHTHALMOLOGY EXAM  Discontinued  . PNA vac Low Risk Adult  Discontinued    Health Maintenance  Health Maintenance Due  Topic Date Due  . INFLUENZA VACCINE  10/29/2019    Colorectal cancer screening: No longer required.    Mammogram status: Completed 04/06/17. Repeat every year   Bone Density status:  Completed 04/11/15. Results  reflect: Bone density results: OSTEOPENIA. Repeat every 2 years.  Lung Cancer Screening: (Low Dose CT Chest recommended if Age 70-80 years, 30 pack-year currently smoking OR have quit w/in 15years.) does not qualify.   Additional Screening:  Hepatitis C Screening: no longer required  Vision Screening: Recommended annual ophthalmology exams for early detection of glaucoma and other disorders of the eye. Is the patient up to date with their annual eye exam?  Yes  Who is the provider or what is the name of the office in which the patient attends annual eye exams? Dr. Clydene Pugh   Dental Screening: Recommended annual dental exams for proper oral hygiene  Community Resource Referral / Chronic Care Management: CRR required this visit?  No   CCM required this visit?  No      Plan:     I have personally reviewed and noted the following in the patient's chart:   . Medical and social history . Use of alcohol, tobacco or illicit drugs  . Current medications and supplements . Functional ability and status . Nutritional status . Physical activity . Advanced directives . List of other physicians . Hospitalizations, surgeries, and ER visits in previous 12 months . Vitals . Screenings to include cognitive, depression, and falls . Referrals and appointments  In addition, I have reviewed and discussed with patient certain preventive protocols, quality metrics, and best practice recommendations. A written personalized care plan for preventive services as well as general preventive health recommendations were provided to patient.    Due to this being a telephonic visit, the after visit summary with patients personalized plan was offered to patient via mail or my-chart. Patient declined at this time.  Amber Littler, LPN   04/04/1094   Nurse Notes: none

## 2019-12-06 NOTE — Patient Instructions (Signed)
Amber Padilla , Thank you for taking time to come for your Medicare Wellness Visit. I appreciate your ongoing commitment to your health goals. Please review the following plan we discussed and let me know if I can assist you in the future.   Screening recommendations/referrals: Colonoscopy: no longer required Mammogram: done 04/06/17 Bone Density: done 04/11/15 Recommended yearly ophthalmology/optometry visit for glaucoma screening and checkup Recommended yearly dental visit for hygiene and checkup  Vaccinations: Influenza vaccine: due Pneumococcal vaccine: done 01/23/14 Tdap vaccine: please provide completed documentation of this vaccine if available Shingles vaccine: please provide completed documentation of this vaccine if available Covid-19:done 05/25/19 & 06/15/19  Conditions/risks identified: Keep up the great work!  Next appointment: Follow up in one year for your annual wellness visit    Preventive Care 65 Years and Older, Female Preventive care refers to lifestyle choices and visits with your health care provider that can promote health and wellness. What does preventive care include?  A yearly physical exam. This is also called an annual well check.  Dental exams once or twice a year.  Routine eye exams. Ask your health care provider how often you should have your eyes checked.  Personal lifestyle choices, including:  Daily care of your teeth and gums.  Regular physical activity.  Eating a healthy diet.  Avoiding tobacco and drug use.  Limiting alcohol use.  Practicing safe sex.  Taking low-dose aspirin every day.  Taking vitamin and mineral supplements as recommended by your health care provider. What happens during an annual well check? The services and screenings done by your health care provider during your annual well check will depend on your age, overall health, lifestyle risk factors, and family history of disease. Counseling  Your health care provider  may ask you questions about your:  Alcohol use.  Tobacco use.  Drug use.  Emotional well-being.  Home and relationship well-being.  Sexual activity.  Eating habits.  History of falls.  Memory and ability to understand (cognition).  Work and work Astronomer.  Reproductive health. Screening  You may have the following tests or measurements:  Height, weight, and BMI.  Blood pressure.  Lipid and cholesterol levels. These may be checked every 5 years, or more frequently if you are over 35 years old.  Skin check.  Lung cancer screening. You may have this screening every year starting at age 88 if you have a 30-pack-year history of smoking and currently smoke or have quit within the past 15 years.  Fecal occult blood test (FOBT) of the stool. You may have this test every year starting at age 40.  Flexible sigmoidoscopy or colonoscopy. You may have a sigmoidoscopy every 5 years or a colonoscopy every 10 years starting at age 33.  Hepatitis C blood test.  Hepatitis B blood test.  Sexually transmitted disease (STD) testing.  Diabetes screening. This is done by checking your blood sugar (glucose) after you have not eaten for a while (fasting). You may have this done every 1-3 years.  Bone density scan. This is done to screen for osteoporosis. You may have this done starting at age 30.  Mammogram. This may be done every 1-2 years. Talk to your health care provider about how often you should have regular mammograms. Talk with your health care provider about your test results, treatment options, and if necessary, the need for more tests. Vaccines  Your health care provider may recommend certain vaccines, such as:  Influenza vaccine. This is recommended every year.  Tetanus,  diphtheria, and acellular pertussis (Tdap, Td) vaccine. You may need a Td booster every 10 years.  Zoster vaccine. You may need this after age 28.  Pneumococcal 13-valent conjugate (PCV13) vaccine.  One dose is recommended after age 43.  Pneumococcal polysaccharide (PPSV23) vaccine. One dose is recommended after age 21. Talk to your health care provider about which screenings and vaccines you need and how often you need them. This information is not intended to replace advice given to you by your health care provider. Make sure you discuss any questions you have with your health care provider. Document Released: 04/12/2015 Document Revised: 12/04/2015 Document Reviewed: 01/15/2015 Elsevier Interactive Patient Education  2017 [employer] Prevention in the Home Falls can cause injuries. They can happen to people of all ages. There are many things you can do to make your home safe and to help prevent falls. What can I do on the outside of my home?  Regularly fix the edges of walkways and driveways and fix any cracks.  Remove anything that might make you trip as you walk through a door, such as a raised step or threshold.  Trim any bushes or trees on the path to your home.  Use bright outdoor lighting.  Clear any walking paths of anything that might make someone trip, such as rocks or tools.  Regularly check to see if handrails are loose or broken. Make sure that both sides of any steps have handrails.  Any raised decks and porches should have guardrails on the edges.  Have any leaves, snow, or ice cleared regularly.  Use sand or salt on walking paths during winter.  Clean up any spills in your garage right away. This includes oil or grease spills. What can I do in the bathroom?  Use night lights.  Install grab bars by the toilet and in the tub and shower. Do not use towel bars as grab bars.  Use non-skid mats or decals in the tub or shower.  If you need to sit down in the shower, use a plastic, non-slip stool.  Keep the floor dry. Clean up any water that spills on the floor as soon as it happens.  Remove soap buildup in the tub or shower regularly.  Attach bath  mats securely with double-sided non-slip rug tape.  Do not have throw rugs and other things on the floor that can make you trip. What can I do in the bedroom?  Use night lights.  Make sure that you have a light by your bed that is easy to reach.  Do not use any sheets or blankets that are too big for your bed. They should not hang down onto the floor.  Have a firm chair that has side arms. You can use this for support while you get dressed.  Do not have throw rugs and other things on the floor that can make you trip. What can I do in the kitchen?  Clean up any spills right away.  Avoid walking on wet floors.  Keep items that you use a lot in easy-to-reach places.  If you need to reach something above you, use a strong step stool that has a grab bar.  Keep electrical cords out of the way.  Do not use floor polish or wax that makes floors slippery. If you must use wax, use non-skid floor wax.  Do not have throw rugs and other things on the floor that can make you trip. What can I do with  my stairs?  Do not leave any items on the stairs.  Make sure that there are handrails on both sides of the stairs and use them. Fix handrails that are broken or loose. Make sure that handrails are as long as the stairways.  Check any carpeting to make sure that it is firmly attached to the stairs. Fix any carpet that is loose or worn.  Avoid having throw rugs at the top or bottom of the stairs. If you do have throw rugs, attach them to the floor with carpet tape.  Make sure that you have a light switch at the top of the stairs and the bottom of the stairs. If you do not have them, ask someone to add them for you. What else can I do to help prevent falls?  Wear shoes that:  Do not have high heels.  Have rubber bottoms.  Are comfortable and fit you well.  Are closed at the toe. Do not wear sandals.  If you use a stepladder:  Make sure that it is fully opened. Do not climb a closed  stepladder.  Make sure that both sides of the stepladder are locked into place.  Ask someone to hold it for you, if possible.  Clearly mark and make sure that you can see:  Any grab bars or handrails.  First and last steps.  Where the edge of each step is.  Use tools that help you move around (mobility aids) if they are needed. These include:  Canes.  Walkers.  Scooters.  Crutches.  Turn on the lights when you go into a dark area. Replace any light bulbs as soon as they burn out.  Set up your furniture so you have a clear path. Avoid moving your furniture around.  If any of your floors are uneven, fix them.  If there are any pets around you, be aware of where they are.  Review your medicines with your doctor. Some medicines can make you feel dizzy. This can increase your chance of falling. Ask your doctor what other things that you can do to help prevent falls. This information is not intended to replace advice given to you by your health care provider. Make sure you discuss any questions you have with your health care provider. Document Released: 01/10/2009 Document Revised: 08/22/2015 Document Reviewed: 04/20/2014 Elsevier Interactive Patient Education  2017 [employer].

## 2019-12-19 ENCOUNTER — Ambulatory Visit: Payer: Medicare HMO | Admitting: Internal Medicine

## 2019-12-19 NOTE — Progress Notes (Deleted)
Date:  12/19/2019   Name:  Amber Padilla   DOB:  Jan 31, 1936   MRN:  [id number]   Chief Complaint: No chief complaint on file.  Diabetes She presents for her follow-up diabetic visit. She has type 2 diabetes mellitus. Her disease course has been stable. Pertinent negatives for hypoglycemia include no headaches or tremors. Pertinent negatives for diabetes include no chest pain, no fatigue, no polydipsia and no polyuria. Current diabetic treatment includes oral agent (monotherapy). She is compliant with treatment all of the time. An ACE inhibitor/angiotensin II receptor blocker is being taken.  Hypertension This is a chronic problem. The problem is resistant. Pertinent negatives include no chest pain, headaches, palpitations or shortness of breath. Past treatments include angiotensin blockers, beta blockers and central alpha agonists (clonidine dose increased last visit and cardura discontinued).    Lab Results  Component Value Date   CREATININE 0.85 06/27/2019   BUN 14 06/27/2019   NA 136 06/27/2019   K 4.4 06/27/2019   CL 100 06/27/2019   CO2 27 06/27/2019   Lab Results  Component Value Date   CHOL 152 12/28/2018   HDL 54 12/28/2018   LDLCALC 84 12/28/2018   TRIG 74 12/28/2018   CHOLHDL 2.8 12/28/2018   Lab Results  Component Value Date   TSH 2.330 06/27/2019   Lab Results  Component Value Date   HGBA1C 6.5 (H) 06/27/2019   Lab Results  Component Value Date   WBC 5.3 06/27/2019   HGB 12.3 06/27/2019   HCT 37.8 06/27/2019   MCV 92 06/27/2019   PLT 175 06/27/2019   Lab Results  Component Value Date   ALT 8 06/27/2019   AST 18 06/27/2019   ALKPHOS 81 06/27/2019   BILITOT 0.6 06/27/2019     Review of Systems  Constitutional: Negative for appetite change, fatigue, fever and unexpected weight change.  HENT: Negative for tinnitus and trouble swallowing.   Eyes: Negative for visual disturbance.  Respiratory: Negative for cough, chest tightness and shortness of  breath.   Cardiovascular: Negative for chest pain, palpitations and leg swelling.  Gastrointestinal: Negative for abdominal pain.  Endocrine: Negative for polydipsia and polyuria.  Genitourinary: Negative for dysuria and hematuria.  Musculoskeletal: Negative for arthralgias.  Neurological: Negative for tremors, numbness and headaches.  Psychiatric/Behavioral: Negative for dysphoric mood.    Patient Active Problem List   Diagnosis Date Noted  . Type II diabetes mellitus with complication (HCC) 12/28/2018  . Essential hypertension 12/28/2018  . Hyperlipidemia 12/28/2018    Allergies  Allergen Reactions  . Cardura [Doxazosin] Other (See Comments)    Dry mouth  . Norvasc [Amlodipine] Swelling    Past Surgical History:  Procedure Laterality Date  . BUNIONECTOMY Bilateral 2005    Social History   Tobacco Use  . Smoking status: Never Smoker  . Smokeless tobacco: Never Used  Vaping Use  . Vaping Use: Never used  Substance Use Topics  . Alcohol use: Never  . Drug use: Never     Medication list has been reviewed and updated.  No outpatient medications have been marked as taking for the 12/19/19 encounter (Appointment) with Reubin Milan, MD.    [facility] 2/9 Scores 12/06/2019 06/27/2019 12/28/2018  PHQ - 2 Score 0 0 0  PHQ- 9 Score - 0 -    GAD 7 : Generalized Anxiety Score 06/27/2019  Nervous, Anxious, on Edge 0  Control/stop worrying 0  Worry too much - different things 0  Trouble relaxing 0  Restless  0  Easily annoyed or irritable 0  Afraid - awful might happen 0  Total GAD 7 Score 0  Anxiety Difficulty Not difficult at all    BP Readings from Last 3 Encounters:  08/06/19 (S) (!) 219/98  06/27/19 (!) 142/98  12/28/18 132/88    Physical Exam Vitals and nursing note reviewed.  Constitutional:      General: She is not in acute distress.    Appearance: She is well-developed.  HENT:     Head: Normocephalic and atraumatic.  Cardiovascular:     Rate and Rhythm:  Normal rate and regular rhythm.     Pulses: Normal pulses.  Pulmonary:     Effort: Pulmonary effort is normal. No respiratory distress.  Musculoskeletal:        General: Normal range of motion.     Cervical back: Normal range of motion.  Lymphadenopathy:     Cervical: No cervical adenopathy.  Skin:    General: Skin is warm and dry.     Findings: No rash.  Neurological:     Mental Status: She is alert and oriented to person, place, and time.  Psychiatric:        Behavior: Behavior normal.        Thought Content: Thought content normal.     Wt Readings from Last 3 Encounters:  08/06/19 156 lb (70.8 kg)  06/27/19 156 lb (70.8 kg)  12/28/18 161 lb (73 kg)    There were no vitals taken for this visit.  Assessment and Plan:

## 2019-12-25 ENCOUNTER — Other Ambulatory Visit: Payer: Self-pay | Admitting: Internal Medicine

## 2019-12-25 DIAGNOSIS — I10 Essential (primary) hypertension: Secondary | ICD-10-CM

## 2019-12-27 ENCOUNTER — Encounter: Payer: Self-pay | Admitting: Internal Medicine

## 2019-12-27 ENCOUNTER — Other Ambulatory Visit: Payer: Self-pay

## 2019-12-27 ENCOUNTER — Ambulatory Visit (INDEPENDENT_AMBULATORY_CARE_PROVIDER_SITE_OTHER): Payer: Medicare HMO | Admitting: Internal Medicine

## 2019-12-27 VITALS — BP 166/100 | HR 54 | Ht 64.0 in | Wt 156.0 lb

## 2019-12-27 DIAGNOSIS — Z23 Encounter for immunization: Secondary | ICD-10-CM | POA: Diagnosis not present

## 2019-12-27 DIAGNOSIS — E118 Type 2 diabetes mellitus with unspecified complications: Secondary | ICD-10-CM

## 2019-12-27 DIAGNOSIS — I10 Essential (primary) hypertension: Secondary | ICD-10-CM

## 2019-12-27 MED ORDER — METFORMIN HCL 500 MG PO TABS: 500.0000 mg | ORAL_TABLET | Freq: Two times a day (BID) | ORAL | 1 refills | Status: DC

## 2019-12-27 MED ORDER — TELMISARTAN 80 MG PO TABS: 80.0000 mg | ORAL_TABLET | Freq: Every day | ORAL | 1 refills | Status: DC

## 2019-12-27 MED ORDER — CLONIDINE HCL 0.2 MG PO TABS: 0.2000 mg | ORAL_TABLET | Freq: Three times a day (TID) | ORAL | 1 refills | Status: DC

## 2019-12-27 NOTE — Progress Notes (Signed)
Date:  12/27/2019   Name:  Amber Padilla   DOB:  [birth year]   MRN:  [id number]   Chief Complaint: Hypertension (Follow up.), Diabetes (Follow up.), and Flu Vaccine (high dose flu shot.)  Hypertension This is a chronic problem. The problem has been gradually improving (much better at home - but higher now due to recent extensive dental work and ongoing pain) since onset. Pertinent negatives include no chest pain, headaches, palpitations or shortness of breath. Past treatments include beta blockers and angiotensin blockers (cardura d/c'd last visit; clonidine increased to tid).  Diabetes She presents for her follow-up diabetic visit. She has type 2 diabetes mellitus. Her disease course has been stable. Pertinent negatives for hypoglycemia include no headaches or nervousness/anxiousness. Pertinent negatives for diabetes include no chest pain and no fatigue. Current diabetic treatment includes oral agent (monotherapy). She is compliant with treatment most of the time. Her breakfast blood glucose is taken between 6-7 am. Her breakfast blood glucose range is generally 90-110 mg/dl. An ACE inhibitor/angiotensin II receptor blocker is being taken.    Lab Results  Component Value Date   CREATININE 0.85 06/27/2019   BUN 14 06/27/2019   NA 136 06/27/2019   K 4.4 06/27/2019   CL 100 06/27/2019   CO2 27 06/27/2019   Lab Results  Component Value Date   CHOL 152 12/28/2018   HDL 54 12/28/2018   LDLCALC 84 12/28/2018   TRIG 74 12/28/2018   CHOLHDL 2.8 12/28/2018   Lab Results  Component Value Date   TSH 2.330 06/27/2019   Lab Results  Component Value Date   HGBA1C 6.5 (H) 06/27/2019   Lab Results  Component Value Date   WBC 5.3 06/27/2019   HGB 12.3 06/27/2019   HCT 37.8 06/27/2019   MCV 92 06/27/2019   PLT 175 06/27/2019   Lab Results  Component Value Date   ALT 8 06/27/2019   AST 18 06/27/2019   ALKPHOS 81 06/27/2019   BILITOT 0.6 06/27/2019     Review of Systems    Constitutional: Negative for appetite change, fatigue, fever and unexpected weight change.  HENT: Negative for trouble swallowing.   Eyes: Negative for visual disturbance.  Respiratory: Negative for cough, chest tightness and shortness of breath.   Cardiovascular: Negative for chest pain, palpitations and leg swelling.  Gastrointestinal: Negative for abdominal pain, constipation and diarrhea.  Genitourinary: Negative for dysuria and hematuria.  Skin: Negative for color change and wound.  Neurological: Negative for numbness and headaches.  Psychiatric/Behavioral: Negative for dysphoric mood and sleep disturbance. The patient is not nervous/anxious.     Patient Active Problem List   Diagnosis Date Noted  . Type II diabetes mellitus with complication (HCC) 12/28/2018  . Essential hypertension 12/28/2018  . Hyperlipidemia 12/28/2018    Allergies  Allergen Reactions  . Cardura [Doxazosin] Other (See Comments)    Dry mouth  . Norvasc [Amlodipine] Swelling    Past Surgical History:  Procedure Laterality Date  . BUNIONECTOMY Bilateral 2005    Social History   Tobacco Use  . Smoking status: Never Smoker  . Smokeless tobacco: Never Used  Vaping Use  . Vaping Use: Never used  Substance Use Topics  . Alcohol use: Never  . Drug use: Never     Medication list has been reviewed and updated.  Current Meds  Medication Sig  . aspirin EC 81 MG tablet Take 81 mg by mouth daily.  . calcium-vitamin D (OSCAL WITH D) 500-200 MG-UNIT tablet Take 1  tablet by mouth.  . cloNIDine (CATAPRES) 0.2 MG tablet Take 1 tablet (0.2 mg total) by mouth 3 (three) times daily.  . Lancets (ONETOUCH DELICA PLUS LANCET30G) MISC   . Magnesium 500 MG CAPS Take by mouth.  . metFORMIN (GLUCOPHAGE) 500 MG tablet Take 500 mg by mouth 2 (two) times daily with a meal.   . metoprolol succinate (TOPROL-XL) 100 MG 24 hr tablet TAKE 1 TABLET DAILY  . ONETOUCH VERIO test strip   . telmisartan (MICARDIS) 80 MG  tablet Take 1 tablet (80 mg total) by mouth daily.    PHQ 2/9 Scores 12/06/2019 06/27/2019 12/28/2018  PHQ - 2 Score 0 0 0  PHQ- 9 Score - 0 -    GAD 7 : Generalized Anxiety Score 06/27/2019  Nervous, Anxious, on Edge 0  Control/stop worrying 0  Worry too much - different things 0  Trouble relaxing 0  Restless 0  Easily annoyed or irritable 0  Afraid - awful might happen 0  Total GAD 7 Score 0  Anxiety Difficulty Not difficult at all    BP Readings from Last 3 Encounters:  12/27/19 (!) 172/110  08/06/19 (S) (!) 219/98  06/27/19 (!) 142/98    Physical Exam Vitals and nursing note reviewed.  Constitutional:      General: She is not in acute distress.    Appearance: She is well-developed.  HENT:     Head: Normocephalic and atraumatic.  Cardiovascular:     Rate and Rhythm: Regular rhythm. Bradycardia present.  No extrasystoles are present.    Pulses: Normal pulses.     Heart sounds: No murmur heard.   Pulmonary:     Effort: Pulmonary effort is normal. No respiratory distress.     Breath sounds: No wheezing or rhonchi.  Musculoskeletal:     Cervical back: Normal range of motion.     Right lower leg: No edema.     Left lower leg: No edema.  Lymphadenopathy:     Cervical: No cervical adenopathy.  Skin:    General: Skin is warm and dry.     Capillary Refill: Capillary refill takes less than 2 seconds.     Findings: No rash.  Neurological:     General: No focal deficit present.     Mental Status: She is alert and oriented to person, place, and time.  Psychiatric:        Mood and Affect: Mood normal.        Behavior: Behavior normal.     Wt Readings from Last 3 Encounters:  12/27/19 156 lb (70.8 kg)  08/06/19 156 lb (70.8 kg)  06/27/19 156 lb (70.8 kg)    BP (!) 172/110 (BP Location: Right Arm, Patient Position: Sitting, Cuff Size: Normal)   Pulse (!) 54   Ht 5\' 4"  (1.626 m)   Wt 156 lb (70.8 kg)   SpO2 95%   BMI 26.78 kg/m   Assessment and Plan: 1.  Essential hypertension Improved control per patient but higher now due to dental pain Continue current regimen and recheck in 2 months - Basic metabolic panel - telmisartan (MICARDIS) 80 MG tablet; Take 1 tablet (80 mg total) by mouth daily.  Dispense: 90 tablet; Refill: 1 - cloNIDine (CATAPRES) 0.2 MG tablet; Take 1 tablet (0.2 mg total) by mouth 3 (three) times daily.  Dispense: 270 tablet; Refill: 1  2. Type II diabetes mellitus with complication (HCC) Clinically stable by exam and report without s/s of hypoglycemia. DM complicated by HTN. Tolerating medications  well without side effects or other concerns. Eye exam scheduled - Hemoglobin A1c - metFORMIN (GLUCOPHAGE) 500 MG tablet; Take 1 tablet (500 mg total) by mouth 2 (two) times daily with a meal.  Dispense: 180 tablet; Refill: 1  3. Need for immunization against influenza - Flu Vaccine QUAD High Dose(Fluad)   Partially dictated using Animal nutritionist. Any errors are unintentional.  Bari Edward, MD [facility]  12/27/2019

## 2019-12-28 LAB — BASIC METABOLIC PANEL
BUN/Creatinine Ratio: 19 (ref 12–28)
BUN: 19 mg/dL (ref 8–27)
CO2: 27 mmol/L (ref 20–29)
Calcium: 9.9 mg/dL (ref 8.7–10.3)
Chloride: 98 mmol/L (ref 96–106)
Creatinine, Ser: 0.98 mg/dL (ref 0.57–1.00)
GFR calc Af Amer: 62 mL/min/{1.73_m2} (ref >59)
GFR calc non Af Amer: 54 mL/min/{1.73_m2} — ABNORMAL LOW (ref >59)
Glucose: 113 mg/dL — ABNORMAL HIGH (ref 65–99)
Potassium: 4.6 mmol/L (ref 3.5–5.2)
Sodium: 139 mmol/L (ref 134–144)

## 2019-12-28 LAB — HEMOGLOBIN A1C
Est. average glucose Bld gHb Est-mCnc: 140 mg/dL
Hgb A1c MFr Bld: 6.5 % — ABNORMAL HIGH (ref 4.8–5.6)

## 2019-12-29 ENCOUNTER — Telehealth: Payer: Self-pay

## 2019-12-29 NOTE — Telephone Encounter (Unsigned)
Copied from CRM [id number]. Topic: General - Other >> Dec 29, 2019  9:43 AM Dalphine Handing A wrote: Patient is requesting that a new order for the doxazosin 2mg  that was discontinued to be placed to the [facility] mail order [facility]. Patient stated that she forgot to mention this during appt Wednesday

## 2019-12-31 ENCOUNTER — Other Ambulatory Visit: Payer: Self-pay | Admitting: Internal Medicine

## 2019-12-31 DIAGNOSIS — I10 Essential (primary) hypertension: Secondary | ICD-10-CM

## 2019-12-31 MED ORDER — DOXAZOSIN MESYLATE 4 MG PO TABS: 4.0000 mg | ORAL_TABLET | Freq: Every day | ORAL | 0 refills | Status: DC

## 2019-12-31 NOTE — Telephone Encounter (Signed)
If she wants to go back on it but she told me at her visit that the dry mouth was better.  If she does resume the Cardura, she still needs to take the clonidine three times a day and have a recheck of BP as planned.

## 2020-01-01 NOTE — Telephone Encounter (Signed)
Patient informed. She will start back on Cardura and take clonidine TID and follow up as planned after she finishes her dental procedures.   CM

## 2020-01-17 LAB — HM DIABETES EYE EXAM

## 2020-01-18 ENCOUNTER — Encounter: Payer: Self-pay | Admitting: Internal Medicine

## 2020-03-19 ENCOUNTER — Other Ambulatory Visit: Payer: Self-pay | Admitting: Internal Medicine

## 2020-03-19 DIAGNOSIS — I10 Essential (primary) hypertension: Secondary | ICD-10-CM

## 2020-07-01 ENCOUNTER — Encounter: Payer: Self-pay | Admitting: Internal Medicine

## 2020-07-01 ENCOUNTER — Other Ambulatory Visit: Payer: Self-pay

## 2020-07-01 ENCOUNTER — Ambulatory Visit (INDEPENDENT_AMBULATORY_CARE_PROVIDER_SITE_OTHER): Payer: Medicare HMO | Admitting: Internal Medicine

## 2020-07-01 VITALS — BP 146/88 | HR 54 | Temp 97.5°F | Ht 64.0 in | Wt 149.0 lb

## 2020-07-01 DIAGNOSIS — H9312 Tinnitus, left ear: Secondary | ICD-10-CM | POA: Diagnosis not present

## 2020-07-01 DIAGNOSIS — Z0000 Encounter for general adult medical examination without abnormal findings: Secondary | ICD-10-CM

## 2020-07-01 DIAGNOSIS — E118 Type 2 diabetes mellitus with unspecified complications: Secondary | ICD-10-CM

## 2020-07-01 DIAGNOSIS — I10 Essential (primary) hypertension: Secondary | ICD-10-CM | POA: Diagnosis not present

## 2020-07-01 DIAGNOSIS — E782 Mixed hyperlipidemia: Secondary | ICD-10-CM

## 2020-07-01 LAB — POCT URINALYSIS DIPSTICK
Bilirubin, UA: NEGATIVE
Blood, UA: NEGATIVE
Glucose, UA: NEGATIVE
Ketones, UA: NEGATIVE
Leukocytes, UA: NEGATIVE
Nitrite, UA: NEGATIVE
Protein, UA: NEGATIVE
Spec Grav, UA: 1.015 (ref 1.010–1.025)
Urobilinogen, UA: 0.2 E.U./dL
pH, UA: 8 (ref 5.0–8.0)

## 2020-07-01 MED ORDER — METFORMIN HCL 500 MG PO TABS: 500.0000 mg | ORAL_TABLET | Freq: Two times a day (BID) | ORAL | 1 refills | Status: DC

## 2020-07-01 MED ORDER — DOXAZOSIN MESYLATE 4 MG PO TABS: 4.0000 mg | ORAL_TABLET | Freq: Every day | ORAL | 1 refills | Status: DC

## 2020-07-01 MED ORDER — TELMISARTAN 80 MG PO TABS: 80.0000 mg | ORAL_TABLET | Freq: Every day | ORAL | 1 refills | Status: DC

## 2020-07-01 MED ORDER — CLONIDINE HCL 0.2 MG PO TABS: 0.2000 mg | ORAL_TABLET | Freq: Three times a day (TID) | ORAL | 1 refills | Status: DC

## 2020-07-01 MED ORDER — METOPROLOL SUCCINATE ER 100 MG PO TB24: 100.0000 mg | ORAL_TABLET | Freq: Every day | ORAL | 1 refills | Status: DC

## 2020-07-01 NOTE — Progress Notes (Signed)
Date:  07/01/2020   Name:  Amber Padilla   DOB:  [birth year]   MRN:  [id number]   Chief Complaint: Annual Exam (Breast exam no pap, foot exam )  Amber Padilla is a 85 y.o. female who presents today for her Complete Annual Exam. She feels well. She reports exercising walking X3 days a week. She reports she is sleeping well. Breast complaints none.  Mammogram: 03/2017 DEXA: 2017 minimal osteopenia of hip Colonoscopy: aged out  Immunization History  Administered Date(s) Administered  . Fluad Quad(high Dose 65+) 12/28/2018, 12/27/2019  . PFIZER(Purple Top)SARS-COV-2 Vaccination 05/25/2019, 06/15/2019, 01/03/2020  . Pneumococcal Conjugate-13 01/23/2014  . Pneumococcal Polysaccharide-23 12/27/2012    Hypertension This is a chronic problem. The problem is controlled. Pertinent negatives include no chest pain, headaches, palpitations or shortness of breath. Past treatments include angiotensin blockers, beta blockers and direct vasodilators. The current treatment provides significant improvement.  Hyperlipidemia This is a chronic problem. The problem is uncontrolled. Pertinent negatives include no chest pain or shortness of breath.  Diabetes She presents for her follow-up diabetic visit. She has type 2 diabetes mellitus. Her disease course has been stable. Pertinent negatives for hypoglycemia include no dizziness, headaches, nervousness/anxiousness or tremors. Pertinent negatives for diabetes include no chest pain, no fatigue, no polydipsia and no polyuria. Current diabetic treatment includes oral agent (monotherapy). She is compliant with treatment all of the time. She is following a generally healthy diet. An ACE inhibitor/angiotensin II receptor blocker is being taken. Eye exam is current.    Lab Results  Component Value Date   CREATININE 0.98 12/27/2019   BUN 19 12/27/2019   NA 139 12/27/2019   K 4.6 12/27/2019   CL 98 12/27/2019   CO2 27 12/27/2019   Lab Results  Component Value  Date   CHOL 152 12/28/2018   HDL 54 12/28/2018   LDLCALC 84 12/28/2018   TRIG 74 12/28/2018   CHOLHDL 2.8 12/28/2018   Lab Results  Component Value Date   TSH 2.330 06/27/2019   Lab Results  Component Value Date   HGBA1C 6.5 (H) 12/27/2019   Lab Results  Component Value Date   WBC 5.3 06/27/2019   HGB 12.3 06/27/2019   HCT 37.8 06/27/2019   MCV 92 06/27/2019   PLT 175 06/27/2019   Lab Results  Component Value Date   ALT 8 06/27/2019   AST 18 06/27/2019   ALKPHOS 81 06/27/2019   BILITOT 0.6 06/27/2019     Review of Systems  Constitutional: Negative for chills, fatigue and fever.  HENT: Positive for tinnitus (left ear). Negative for congestion, hearing loss, trouble swallowing and voice change.   Eyes: Negative for visual disturbance.  Respiratory: Negative for cough, chest tightness, shortness of breath and wheezing.   Cardiovascular: Negative for chest pain, palpitations and leg swelling.  Gastrointestinal: Negative for abdominal pain, constipation, diarrhea and vomiting.  Endocrine: Negative for polydipsia and polyuria.  Genitourinary: Negative for dysuria, frequency, genital sores, vaginal bleeding and vaginal discharge.  Musculoskeletal: Negative for arthralgias, gait problem and joint swelling.  Skin: Negative for color change and rash.  Neurological: Negative for dizziness, tremors, light-headedness and headaches.  Hematological: Negative for adenopathy. Does not bruise/bleed easily.  Psychiatric/Behavioral: Negative for dysphoric mood and sleep disturbance. The patient is not nervous/anxious.     Patient Active Problem List   Diagnosis Date Noted  . Type II diabetes mellitus with complication (HCC) 12/28/2018  . Essential hypertension 12/28/2018  . Hyperlipidemia 12/28/2018    Allergies  Allergen Reactions  . Cardura [Doxazosin] Other (See Comments)    Dry mouth  . Norvasc [Amlodipine] Swelling    Past Surgical History:  Procedure Laterality Date   . BUNIONECTOMY Bilateral 2005    Social History   Tobacco Use  . Smoking status: Never Smoker  . Smokeless tobacco: Never Used  Vaping Use  . Vaping Use: Never used  Substance Use Topics  . Alcohol use: Never  . Drug use: Never     Medication list has been reviewed and updated.  Current Meds  Medication Sig  . aspirin EC 81 MG tablet Take 81 mg by mouth daily.  . calcium-vitamin D (OSCAL WITH D) 500-200 MG-UNIT tablet Take 1 tablet by mouth.  . Lancets (ONETOUCH DELICA PLUS LANCET30G) MISC   . Magnesium 500 MG CAPS Take by mouth.  Letta Pate VERIO test strip   . [DISCONTINUED] cloNIDine (CATAPRES) 0.2 MG tablet Take 1 tablet (0.2 mg total) by mouth 3 (three) times daily.  . [DISCONTINUED] doxazosin (CARDURA) 4 MG tablet TAKE 1 TABLET DAILY  . [DISCONTINUED] metFORMIN (GLUCOPHAGE) 500 MG tablet Take 1 tablet (500 mg total) by mouth 2 (two) times daily with a meal.  . [DISCONTINUED] metoprolol succinate (TOPROL-XL) 100 MG 24 hr tablet TAKE 1 TABLET DAILY  . [DISCONTINUED] telmisartan (MICARDIS) 80 MG tablet Take 1 tablet (80 mg total) by mouth daily.    PHQ 2/9 Scores 07/01/2020 12/06/2019 06/27/2019 12/28/2018  PHQ - 2 Score 0 0 0 0  PHQ- 9 Score 0 - 0 -    GAD 7 : Generalized Anxiety Score 07/01/2020 06/27/2019  Nervous, Anxious, on Edge 0 0  Control/stop worrying 0 0  Worry too much - different things 0 0  Trouble relaxing 0 0  Restless 0 0  Easily annoyed or irritable 0 0  Afraid - awful might happen 0 0  Total GAD 7 Score 0 0  Anxiety Difficulty - Not difficult at all    BP Readings from Last 3 Encounters:  07/01/20 (!) 146/88  12/27/19 (!) 166/100  08/06/19 (S) (!) 219/98    Physical Exam Vitals and nursing note reviewed.  Constitutional:      General: She is not in acute distress.    Appearance: She is well-developed.  HENT:     Head: Normocephalic and atraumatic.     Right Ear: Tympanic membrane and ear canal normal. No swelling. No middle ear effusion.  There is no impacted cerumen. Tympanic membrane is not retracted.     Left Ear: Tympanic membrane and ear canal normal. No swelling.  No middle ear effusion. There is no impacted cerumen. Tympanic membrane is not retracted.     Nose:     Right Sinus: No maxillary sinus tenderness.     Left Sinus: No maxillary sinus tenderness.  Eyes:     General: No scleral icterus.       Right eye: No discharge.        Left eye: No discharge.     Conjunctiva/sclera: Conjunctivae normal.  Neck:     Thyroid: No thyromegaly.     Vascular: No carotid bruit.  Cardiovascular:     Rate and Rhythm: Normal rate and regular rhythm.     Pulses: Normal pulses.     Heart sounds: Normal heart sounds.  Pulmonary:     Effort: Pulmonary effort is normal. No respiratory distress.     Breath sounds: No wheezing.  Chest:  Breasts:     Right: No mass, nipple discharge,  skin change or tenderness.     Left: No mass, nipple discharge, skin change or tenderness.    Abdominal:     General: Bowel sounds are normal.     Palpations: Abdomen is soft.     Tenderness: There is no abdominal tenderness.  Musculoskeletal:     Cervical back: Normal range of motion. No erythema.     Right lower leg: No edema.     Left lower leg: No edema.  Lymphadenopathy:     Cervical: No cervical adenopathy.  Skin:    General: Skin is warm and dry.     Capillary Refill: Capillary refill takes less than 2 seconds.     Findings: No rash.  Neurological:     General: No focal deficit present.     Mental Status: She is alert and oriented to person, place, and time.     Cranial Nerves: No cranial nerve deficit.     Sensory: No sensory deficit.     Deep Tendon Reflexes: Reflexes are normal and symmetric.  Psychiatric:        Attention and Perception: Attention normal.        Mood and Affect: Mood normal.     Wt Readings from Last 3 Encounters:  07/01/20 149 lb (67.6 kg)  12/27/19 156 lb (70.8 kg)  08/06/19 156 lb (70.8 kg)    BP (!)  146/88   Pulse (!) 54   Temp (!) 97.5 F (36.4 C) (Oral)   Ht 5\' 4"  (1.626 m)   Wt 149 lb (67.6 kg)   SpO2 98%   BMI 25.58 kg/m   Assessment and Plan: 1. Annual physical exam Normal exam for age Continue regular exercise, healthy diet  2. Essential hypertension HTN not optimally controlled on 4 agents - CBC with Differential/Platelet - POCT urinalysis dipstick - cloNIDine (CATAPRES) 0.2 MG tablet; Take 1 tablet (0.2 mg total) by mouth 3 (three) times daily.  Dispense: 270 tablet; Refill: 1 - doxazosin (CARDURA) 4 MG tablet; Take 1 tablet (4 mg total) by mouth daily.  Dispense: 90 tablet; Refill: 1 - metoprolol succinate (TOPROL-XL) 100 MG 24 hr tablet; Take 1 tablet (100 mg total) by mouth daily. Take with or immediately following a meal.  Dispense: 90 tablet; Refill: 1 - telmisartan (MICARDIS) 80 MG tablet; Take 1 tablet (80 mg total) by mouth daily.  Dispense: 90 tablet; Refill: 1  3. Type II diabetes mellitus with complication (HCC) Clinically stable by exam and report without s/s of hypoglycemia. DM complicated by HTN. Tolerating medications - metformin-  well without side effects or other concerns. - Comprehensive metabolic panel - Hemoglobin A1c - TSH - metFORMIN (GLUCOPHAGE) 500 MG tablet; Take 1 tablet (500 mg total) by mouth 2 (two) times daily with a meal.  Dispense: 180 tablet; Refill: 1  4. Mixed hyperlipidemia Not on statin due to age and minimal LDL elevation - Lipid panel  5. Tinnitus of left ear - Ambulatory referral to ENT   Partially dictated using Dragon software. Any errors are unintentional.  , MD [facility]  07/01/2020

## 2020-07-02 LAB — CBC WITH DIFFERENTIAL/PLATELET
Basophils Absolute: 0.1 10*3/uL (ref 0.0–0.2)
Basos: 1 %
EOS (ABSOLUTE): 0.4 10*3/uL (ref 0.0–0.4)
Eos: 8 %
Hematocrit: 32.7 % — ABNORMAL LOW (ref 34.0–46.6)
Hemoglobin: 10.8 g/dL — ABNORMAL LOW (ref 11.1–15.9)
Immature Grans (Abs): 0 10*3/uL (ref 0.0–0.1)
Immature Granulocytes: 0 %
Lymphocytes Absolute: 2 10*3/uL (ref 0.7–3.1)
Lymphs: 37 %
MCH: 29.2 pg (ref 26.6–33.0)
MCHC: 33 g/dL (ref 31.5–35.7)
MCV: 88 fL (ref 79–97)
Monocytes Absolute: 0.5 10*3/uL (ref 0.1–0.9)
Monocytes: 8 %
Neutrophils Absolute: 2.6 10*3/uL (ref 1.4–7.0)
Neutrophils: 46 %
Platelets: 201 10*3/uL (ref 150–450)
RBC: 3.7 x10E6/uL — ABNORMAL LOW (ref 3.77–5.28)
RDW: 14.1 % (ref 11.7–15.4)
WBC: 5.5 10*3/uL (ref 3.4–10.8)

## 2020-07-02 LAB — COMPREHENSIVE METABOLIC PANEL
ALT: 6 IU/L (ref 0–32)
AST: 17 IU/L (ref 0–40)
Albumin/Globulin Ratio: 1.2 (ref 1.2–2.2)
Albumin: 4.2 g/dL (ref 3.6–4.6)
Alkaline Phosphatase: 76 IU/L (ref 44–121)
BUN/Creatinine Ratio: 20 (ref 12–28)
BUN: 18 mg/dL (ref 8–27)
Bilirubin Total: 0.6 mg/dL (ref 0.0–1.2)
CO2: 24 mmol/L (ref 20–29)
Calcium: 9.9 mg/dL (ref 8.7–10.3)
Chloride: 101 mmol/L (ref 96–106)
Creatinine, Ser: 0.9 mg/dL (ref 0.57–1.00)
Globulin, Total: 3.5 g/dL (ref 1.5–4.5)
Glucose: 114 mg/dL — ABNORMAL HIGH (ref 65–99)
Potassium: 4 mmol/L (ref 3.5–5.2)
Sodium: 140 mmol/L (ref 134–144)
Total Protein: 7.7 g/dL (ref 6.0–8.5)
eGFR: 64 mL/min/{1.73_m2} (ref >59)

## 2020-07-02 LAB — LIPID PANEL
Chol/HDL Ratio: 3.6 ratio (ref 0.0–4.4)
Cholesterol, Total: 222 mg/dL — ABNORMAL HIGH (ref 100–199)
HDL: 61 mg/dL (ref >39)
LDL Chol Calc (NIH): 144 mg/dL — ABNORMAL HIGH (ref 0–99)
Triglycerides: 95 mg/dL (ref 0–149)
VLDL Cholesterol Cal: 17 mg/dL (ref 5–40)

## 2020-07-02 LAB — TSH: TSH: 2.03 u[IU]/mL (ref 0.450–4.500)

## 2020-07-02 LAB — HEMOGLOBIN A1C
Est. average glucose Bld gHb Est-mCnc: 143 mg/dL
Hgb A1c MFr Bld: 6.6 % — ABNORMAL HIGH (ref 4.8–5.6)

## 2020-07-04 ENCOUNTER — Telehealth: Payer: Self-pay | Admitting: Internal Medicine

## 2020-07-04 NOTE — Progress Notes (Signed)
Called pt could not leave VM. Phone busy.  Will route this note to [facility] Nurse just in case the patient returns call to clinic. [facility] Nurse may give patient results if pt returns the call.  CRM has also been created for this msg for call center purposes. Please attach any notes regarding this lab to this result message and do not create a new CRM.   KP

## 2020-07-04 NOTE — Telephone Encounter (Signed)
Pharmacy called to notify doctor that they were notified that the patient has an allergy to the medication doxazosin (CARDURA) 4 MG tablet, which was sent in to be filled.  Please advise and call pharmacy to get an alternative medication.  CB# [phone], Ref# [id number]

## 2020-07-05 ENCOUNTER — Other Ambulatory Visit: Payer: Self-pay

## 2020-07-05 DIAGNOSIS — E782 Mixed hyperlipidemia: Secondary | ICD-10-CM

## 2020-07-05 MED ORDER — ATORVASTATIN CALCIUM 10 MG PO TABS: 10.0000 mg | ORAL_TABLET | Freq: Every day | ORAL | 0 refills | Status: DC

## 2020-07-05 NOTE — Telephone Encounter (Signed)
She has been taking this medication and does not have an allergy to it.

## 2020-07-05 NOTE — Telephone Encounter (Signed)
Called and confirmed patient can take this and they need to mail it out.

## 2020-07-05 NOTE — Progress Notes (Unsigned)
Sent in lipitor to mail order pharm

## 2020-07-05 NOTE — Progress Notes (Signed)
Review.

## 2020-07-13 ENCOUNTER — Encounter: Payer: Self-pay | Admitting: Emergency Medicine

## 2020-07-13 ENCOUNTER — Inpatient Hospital Stay
Admission: EM | Admit: 2020-07-13 | Discharge: 2020-07-17 | DRG: 305 | Disposition: A | Discharge status: Home Health | Payer: Medicare HMO | Attending: Internal Medicine | Admitting: Internal Medicine

## 2020-07-13 ENCOUNTER — Other Ambulatory Visit: Payer: Self-pay

## 2020-07-13 DIAGNOSIS — I10 Essential (primary) hypertension: Secondary | ICD-10-CM

## 2020-07-13 DIAGNOSIS — E785 Hyperlipidemia, unspecified: Secondary | ICD-10-CM | POA: Diagnosis present

## 2020-07-13 DIAGNOSIS — D649 Anemia, unspecified: Secondary | ICD-10-CM | POA: Diagnosis present

## 2020-07-13 DIAGNOSIS — Z20822 Contact with and (suspected) exposure to covid-19: Secondary | ICD-10-CM | POA: Diagnosis present

## 2020-07-13 DIAGNOSIS — H9202 Otalgia, left ear: Secondary | ICD-10-CM | POA: Diagnosis present

## 2020-07-13 DIAGNOSIS — I161 Hypertensive emergency: Principal | ICD-10-CM | POA: Diagnosis present

## 2020-07-13 DIAGNOSIS — E079 Disorder of thyroid, unspecified: Secondary | ICD-10-CM | POA: Diagnosis present

## 2020-07-13 DIAGNOSIS — R9431 Abnormal electrocardiogram [ECG] [EKG]: Secondary | ICD-10-CM | POA: Diagnosis present

## 2020-07-13 DIAGNOSIS — E119 Type 2 diabetes mellitus without complications: Secondary | ICD-10-CM | POA: Diagnosis present

## 2020-07-13 DIAGNOSIS — I160 Hypertensive urgency: Secondary | ICD-10-CM

## 2020-07-13 DIAGNOSIS — E876 Hypokalemia: Secondary | ICD-10-CM

## 2020-07-13 DIAGNOSIS — Z833 Family history of diabetes mellitus: Secondary | ICD-10-CM

## 2020-07-13 DIAGNOSIS — Z79899 Other long term (current) drug therapy: Secondary | ICD-10-CM

## 2020-07-13 DIAGNOSIS — Z7984 Long term (current) use of oral hypoglycemic drugs: Secondary | ICD-10-CM

## 2020-07-13 DIAGNOSIS — R778 Other specified abnormalities of plasma proteins: Secondary | ICD-10-CM | POA: Diagnosis present

## 2020-07-13 DIAGNOSIS — Z7982 Long term (current) use of aspirin: Secondary | ICD-10-CM

## 2020-07-13 DIAGNOSIS — R112 Nausea with vomiting, unspecified: Secondary | ICD-10-CM

## 2020-07-13 DIAGNOSIS — E118 Type 2 diabetes mellitus with unspecified complications: Secondary | ICD-10-CM

## 2020-07-13 DIAGNOSIS — N2889 Other specified disorders of kidney and ureter: Secondary | ICD-10-CM | POA: Diagnosis present

## 2020-07-13 DIAGNOSIS — Z888 Allergy status to other drugs, medicaments and biological substances status: Secondary | ICD-10-CM

## 2020-07-13 LAB — COMPREHENSIVE METABOLIC PANEL
ALT: 13 U/L (ref 0–44)
AST: 25 U/L (ref 15–41)
Albumin: 4.2 g/dL (ref 3.5–5.0)
Alkaline Phosphatase: 68 U/L (ref 38–126)
Anion gap: 12 (ref 5–15)
BUN: 23 mg/dL (ref 8–23)
CO2: 27 mmol/L (ref 22–32)
Calcium: 9.4 mg/dL (ref 8.9–10.3)
Chloride: 100 mmol/L (ref 98–111)
Creatinine, Ser: 0.94 mg/dL (ref 0.44–1.00)
GFR, Estimated: >60 mL/min (ref >60)
Glucose, Bld: 203 mg/dL — ABNORMAL HIGH (ref 70–99)
Potassium: 3 mmol/L — ABNORMAL LOW (ref 3.5–5.1)
Sodium: 139 mmol/L (ref 135–145)
Total Bilirubin: 0.9 mg/dL (ref 0.3–1.2)
Total Protein: 8.8 g/dL — ABNORMAL HIGH (ref 6.5–8.1)

## 2020-07-13 LAB — LIPASE, BLOOD: Lipase: 33 U/L (ref 11–51)

## 2020-07-13 LAB — CBC
HCT: 35.2 % — ABNORMAL LOW (ref 36.0–46.0)
Hemoglobin: 11.3 g/dL — ABNORMAL LOW (ref 12.0–15.0)
MCH: 29.5 pg (ref 26.0–34.0)
MCHC: 32.1 g/dL (ref 30.0–36.0)
MCV: 91.9 fL (ref 80.0–100.0)
Platelets: 207 10*3/uL (ref 150–400)
RBC: 3.83 MIL/uL — ABNORMAL LOW (ref 3.87–5.11)
RDW: 15.7 % — ABNORMAL HIGH (ref 11.5–15.5)
WBC: 9.9 10*3/uL (ref 4.0–10.5)
nRBC: 0 % (ref 0.0–0.2)

## 2020-07-13 MED ORDER — ONDANSETRON 4 MG PO TBDP
4.0000 mg | ORAL_TABLET | Freq: Once | ORAL | Status: AC | PRN
  Administered 2020-07-13: 4 mg via ORAL
  Filled 2020-07-13: qty 1

## 2020-07-13 MED ORDER — POTASSIUM CHLORIDE CRYS ER 20 MEQ PO TBCR
40.0000 meq | EXTENDED_RELEASE_TABLET | Freq: Once | ORAL | Status: DC
  Filled 2020-07-13: qty 2

## 2020-07-13 MED ORDER — POTASSIUM CHLORIDE 10 MEQ/100ML IV SOLN
10.0000 meq | INTRAVENOUS | Status: DC
  Filled 2020-07-13: qty 100

## 2020-07-13 MED ORDER — METOPROLOL TARTRATE 5 MG/5ML IV SOLN: 10.0000 mg | Freq: Once | INTRAVENOUS | Status: DC

## 2020-07-13 MED ORDER — SODIUM CHLORIDE 0.9 % IV BOLUS
500.0000 mL | Freq: Once | INTRAVENOUS | Status: AC
  Administered 2020-07-13: 500 mL via INTRAVENOUS

## 2020-07-13 MED ORDER — METOPROLOL TARTRATE 5 MG/5ML IV SOLN
5.0000 mg | Freq: Once | INTRAVENOUS | Status: AC
  Administered 2020-07-13: 5 mg via INTRAVENOUS
  Filled 2020-07-13: qty 5

## 2020-07-13 NOTE — ED Notes (Signed)
Several warm blankets provided at patient request. Daughter at bedside.

## 2020-07-13 NOTE — ED Notes (Signed)
Pt moved to subwait more emesis att

## 2020-07-13 NOTE — ED Provider Notes (Signed)
[facility] Emergency Department Provider Note   ____________________________________________   I have reviewed the triage vital signs and the nursing notes.   HISTORY  Chief Complaint Chills (vomit), Emesis, and Nausea   History limited by: Not Limited   HPI Amber Padilla is a 85 y.o. female who presents to the emergency department today because of concern for fevers, nausea, vomiting. Symptoms started today. Family does state that over the past few days she has been having some issues with congestion and left ear pain. The patient has not been having any shortness of breath. Additionally the patient states she has been out of her blood pressure medications for the past few days.    Records reviewed. Per medical record review patient has a history of DM, HTN, HLD.   Past Medical History:  Diagnosis Date  . Allergy   . Diabetes mellitus without complication (HCC)   . Hyperlipidemia   . Hypertension   . Thyroid disease     Patient Active Problem List   Diagnosis Date Noted  . Type II diabetes mellitus with complication (HCC) 12/28/2018  . Essential hypertension 12/28/2018  . Hyperlipidemia 12/28/2018    Past Surgical History:  Procedure Laterality Date  . BUNIONECTOMY Bilateral 2005    Prior to Admission medications   Medication Sig Start Date End Date Taking? Authorizing Provider  aspirin EC 81 MG tablet Take 81 mg by mouth daily.    [provider]  atorvastatin (LIPITOR) 10 MG tablet Take 1 tablet (10 mg total) by mouth daily. 07/05/20   Duanne Limerick, MD  calcium-vitamin D (OSCAL WITH D) 500-200 MG-UNIT tablet Take 1 tablet by mouth.    [provider]  cloNIDine (CATAPRES) 0.2 MG tablet Take 1 tablet (0.2 mg total) by mouth 3 (three) times daily. 07/01/20   Reubin Milan, MD  doxazosin (CARDURA) 4 MG tablet Take 1 tablet (4 mg total) by mouth daily. 07/01/20   Reubin Milan, MD  Lancets [facility] [place] PLUS  [place]) MISC  12/10/18   [provider]  Magnesium 500 MG CAPS Take by mouth.    [provider]  metFORMIN (GLUCOPHAGE) 500 MG tablet Take 1 tablet (500 mg total) by mouth 2 (two) times daily with a meal. 07/01/20   Reubin Milan, MD  metoprolol succinate (TOPROL-XL) 100 MG 24 hr tablet Take 1 tablet (100 mg total) by mouth daily. Take with or immediately following a meal. 07/01/20   Judithann Graves Nyoka Cowden, MD  [facility] VERIO test strip  12/10/18   [provider]  telmisartan (MICARDIS) 80 MG tablet Take 1 tablet (80 mg total) by mouth daily. 07/01/20   Reubin Milan, MD    Allergies Norvasc [amlodipine]  Family History  Problem Relation Age of Onset  . Diabetes Mother     Social History Social History   Tobacco Use  . Smoking status: Never Smoker  . Smokeless tobacco: Never Used  Vaping Use  . Vaping Use: Never used  Substance Use Topics  . Alcohol use: Never  . Drug use: Never    Review of Systems Constitutional: Positive for fevers and chills.  Eyes: No visual changes. ENT: Positive for congestion and left ear pain.  Cardiovascular: Denies chest pain. Respiratory: Denies shortness of breath. Gastrointestinal: Positive for nausea and vomiting.  Genitourinary: Negative for dysuria. Musculoskeletal: Negative for back pain. Skin: Negative for rash. Neurological: Negative for headaches, focal weakness or numbness.  ____________________________________________   PHYSICAL EXAM:  VITAL SIGNS:  ED Triage Vitals  Enc Vitals Group     BP 07/13/20 2036 (!) 261/123     Pulse Rate 07/13/20 2036 (!) 57     Resp 07/13/20 2036 18     Temp 07/13/20 2036 99.6 F (37.6 C)     Temp Source 07/13/20 2036 Oral     SpO2 07/13/20 2036 97 %     Weight 07/13/20 2037 149 lb (67.6 kg)     Height 07/13/20 2037 5\' 4"  (1.626 m)     Head Circumference --      Peak Flow --      Pain Score 07/13/20 2037 0   Constitutional: Alert and oriented.  Eyes:  Conjunctivae are normal.  ENT      Head: Normocephalic and atraumatic.      Nose: No congestion/rhinnorhea.      Mouth/Throat: Mucous membranes are moist.      Neck: No stridor. Hematological/Lymphatic/Immunilogical: No cervical lymphadenopathy. Cardiovascular: Normal rate, regular rhythm.  No murmurs, rubs, or gallops.  Respiratory: Normal respiratory effort without tachypnea nor retractions. Breath sounds are clear and equal bilaterally. No wheezes/rales/rhonchi. Gastrointestinal: Soft and non tender. No rebound. No guarding.  Genitourinary: Deferred Musculoskeletal: Normal range of motion in all extremities. No lower extremity edema. Neurologic:  Normal speech and language. No gross focal neurologic deficits are appreciated.  Skin:  Skin is warm, dry and intact. No rash noted. Psychiatric: Mood and affect are normal. Speech and behavior are normal. Patient exhibits appropriate insight and judgment.  ____________________________________________    LABS (pertinent positives/negatives)  Lipase 33 CBC wbc 9.9, hgb 11.3, plt 207 CMP na 139, k 3.0, glu 203, cr 0.94  ____________________________________________   EKG  I, 2038, attending physician, personally viewed and interpreted this EKG  EKG Time: 2252 Rate: 64 Rhythm: sinus rhythm with arrythmia Axis: left axis deviation Intervals: qtc 454 QRS: narrow, LVH ST changes: no st elevation Impression: abnormal ekg   ____________________________________________    RADIOLOGY  CXR No acute disease  ____________________________________________   PROCEDURES  Procedures  ____________________________________________   INITIAL IMPRESSION / ASSESSMENT AND PLAN / ED COURSE  Pertinent labs & imaging results that were available during my care of the patient were reviewed by me and considered in my medical decision making (see chart for details).   Patient presented to the emergency department today because of  concerns for nausea vomiting chills.  Patient was also found to be significantly hypertensive here in the emergency department.  She states she has not been on her meds for a couple of days.  Do have concerns for some underlying infection.  COVID and flu were negative.  Urine without concerning signs for infection.  Will get CT of abdomen given nausea and vomiting. Regardless of results do think patient will require admission given hypertension.   ____________________________________________   FINAL CLINICAL IMPRESSION(S) / ED DIAGNOSES  Hypertension Nausea and vomiting  Note: This dictation was prepared with Dragon dictation. Any transcriptional errors that result from this process are unintentional     2253, MD 07/14/20 1520

## 2020-07-13 NOTE — ED Notes (Signed)
Patient continues to have chills and HTN. Dr. Derrill Kay at bedside, aware of both, awaiting more orders. Temp rechecked at provider request.

## 2020-07-13 NOTE — ED Notes (Signed)
EKG shown to Dr. Derrill Kay.

## 2020-07-13 NOTE — ED Notes (Signed)
Patient reports she usually gets her medications via mail pharmacy, and has run out of her blood pressure medication 3 days ago. Patient reports hypertension at home. Patient also reports some chills, body aches, nausea, and vomiting x 5 days. Patient denies known exposure to COVID.

## 2020-07-13 NOTE — ED Notes (Signed)
Daughter expressing concern for patient HTN. This RN verbalized understanding of concerns. MD notified of HTN. This RN awaiting MD eval of patient. Patient and daughter aware.

## 2020-07-13 NOTE — ED Notes (Signed)
Dr. Derrill Kay made aware of patient continued HTN by Danelle Earthly, RN.

## 2020-07-13 NOTE — ED Notes (Signed)
ED Provider at bedside. 

## 2020-07-13 NOTE — ED Notes (Signed)
Patient expressing concern about taking potassium pills orally. Elijah Birk, RN notified.

## 2020-07-13 NOTE — ED Notes (Signed)
Patient provided warm blankets. Patient updated on POC. Patient placed on cardiac monitor on arrival to room from triage.

## 2020-07-13 NOTE — ED Triage Notes (Signed)
Pt reports N/V x 5 and chills that started today, pt denies pain just nausea, pt denies taking any meds today  Pt denies any abdominal surgery or hx  Hx of HTN (reports last took BP meds 2 days ago) and daily ASA

## 2020-07-13 NOTE — ED Notes (Signed)
Report given to Tom, RN.

## 2020-07-14 ENCOUNTER — Emergency Department: Payer: Medicare HMO

## 2020-07-14 ENCOUNTER — Encounter: Payer: Self-pay | Admitting: Internal Medicine

## 2020-07-14 DIAGNOSIS — N2889 Other specified disorders of kidney and ureter: Secondary | ICD-10-CM | POA: Diagnosis present

## 2020-07-14 DIAGNOSIS — R9431 Abnormal electrocardiogram [ECG] [EKG]: Secondary | ICD-10-CM | POA: Diagnosis present

## 2020-07-14 DIAGNOSIS — Z79899 Other long term (current) drug therapy: Secondary | ICD-10-CM | POA: Diagnosis not present

## 2020-07-14 DIAGNOSIS — R778 Other specified abnormalities of plasma proteins: Secondary | ICD-10-CM | POA: Diagnosis present

## 2020-07-14 DIAGNOSIS — D649 Anemia, unspecified: Secondary | ICD-10-CM | POA: Diagnosis present

## 2020-07-14 DIAGNOSIS — I10 Essential (primary) hypertension: Secondary | ICD-10-CM | POA: Diagnosis present

## 2020-07-14 DIAGNOSIS — E876 Hypokalemia: Secondary | ICD-10-CM

## 2020-07-14 DIAGNOSIS — I161 Hypertensive emergency: Secondary | ICD-10-CM | POA: Diagnosis present

## 2020-07-14 DIAGNOSIS — E079 Disorder of thyroid, unspecified: Secondary | ICD-10-CM | POA: Diagnosis present

## 2020-07-14 DIAGNOSIS — R112 Nausea with vomiting, unspecified: Secondary | ICD-10-CM | POA: Diagnosis not present

## 2020-07-14 DIAGNOSIS — I160 Hypertensive urgency: Secondary | ICD-10-CM | POA: Diagnosis present

## 2020-07-14 DIAGNOSIS — E785 Hyperlipidemia, unspecified: Secondary | ICD-10-CM | POA: Diagnosis present

## 2020-07-14 DIAGNOSIS — H9202 Otalgia, left ear: Secondary | ICD-10-CM | POA: Diagnosis present

## 2020-07-14 DIAGNOSIS — E119 Type 2 diabetes mellitus without complications: Secondary | ICD-10-CM | POA: Diagnosis present

## 2020-07-14 DIAGNOSIS — Z833 Family history of diabetes mellitus: Secondary | ICD-10-CM | POA: Diagnosis not present

## 2020-07-14 DIAGNOSIS — I248 Other forms of acute ischemic heart disease: Secondary | ICD-10-CM | POA: Diagnosis not present

## 2020-07-14 DIAGNOSIS — Z7984 Long term (current) use of oral hypoglycemic drugs: Secondary | ICD-10-CM | POA: Diagnosis not present

## 2020-07-14 DIAGNOSIS — Z888 Allergy status to other drugs, medicaments and biological substances status: Secondary | ICD-10-CM | POA: Diagnosis not present

## 2020-07-14 DIAGNOSIS — Z20822 Contact with and (suspected) exposure to covid-19: Secondary | ICD-10-CM | POA: Diagnosis present

## 2020-07-14 DIAGNOSIS — Z7982 Long term (current) use of aspirin: Secondary | ICD-10-CM | POA: Diagnosis not present

## 2020-07-14 HISTORY — DX: Hypertensive emergency: I16.1

## 2020-07-14 LAB — GLUCOSE, CAPILLARY
Glucose-Capillary: 193 mg/dL — ABNORMAL HIGH (ref 70–99)
Glucose-Capillary: 199 mg/dL — ABNORMAL HIGH (ref 70–99)
Glucose-Capillary: 110 mg/dL — ABNORMAL HIGH (ref 70–99)
Glucose-Capillary: 144 mg/dL — ABNORMAL HIGH (ref 70–99)

## 2020-07-14 LAB — BASIC METABOLIC PANEL
Anion gap: 12 (ref 5–15)
Anion gap: 11 (ref 5–15)
BUN: 20 mg/dL (ref 8–23)
BUN: 28 mg/dL — ABNORMAL HIGH (ref 8–23)
CO2: 25 mmol/L (ref 22–32)
CO2: 24 mmol/L (ref 22–32)
Calcium: 9.5 mg/dL (ref 8.9–10.3)
Calcium: 9.2 mg/dL (ref 8.9–10.3)
Chloride: 101 mmol/L (ref 98–111)
Chloride: 103 mmol/L (ref 98–111)
Creatinine, Ser: 0.86 mg/dL (ref 0.44–1.00)
Creatinine, Ser: 0.88 mg/dL (ref 0.44–1.00)
GFR, Estimated: >60 mL/min (ref >60)
GFR, Estimated: >60 mL/min (ref >60)
Glucose, Bld: 276 mg/dL — ABNORMAL HIGH (ref 70–99)
Glucose, Bld: 144 mg/dL — ABNORMAL HIGH (ref 70–99)
Potassium: 2.5 mmol/L — CL (ref 3.5–5.1)
Potassium: 2.8 mmol/L — ABNORMAL LOW (ref 3.5–5.1)
Sodium: 138 mmol/L (ref 135–145)
Sodium: 138 mmol/L (ref 135–145)

## 2020-07-14 LAB — URINALYSIS, COMPLETE (UACMP) WITH MICROSCOPIC
Bacteria, UA: NONE SEEN
Bilirubin Urine: NEGATIVE
Glucose, UA: >=500 mg/dL — AB
Ketones, ur: 80 mg/dL — AB
Leukocytes,Ua: NEGATIVE
Nitrite: NEGATIVE
Protein, ur: 100 mg/dL — AB
Specific Gravity, Urine: 1.013 (ref 1.005–1.030)
pH: 7 (ref 5.0–8.0)

## 2020-07-14 LAB — CBG MONITORING, ED
Glucose-Capillary: 308 mg/dL — ABNORMAL HIGH (ref 70–99)
Glucose-Capillary: 231 mg/dL — ABNORMAL HIGH (ref 70–99)
Glucose-Capillary: 174 mg/dL — ABNORMAL HIGH (ref 70–99)

## 2020-07-14 LAB — CBC
HCT: 35.9 % — ABNORMAL LOW (ref 36.0–46.0)
Hemoglobin: 11.6 g/dL — ABNORMAL LOW (ref 12.0–15.0)
MCH: 29.2 pg (ref 26.0–34.0)
MCHC: 32.3 g/dL (ref 30.0–36.0)
MCV: 90.4 fL (ref 80.0–100.0)
Platelets: 217 10*3/uL (ref 150–400)
RBC: 3.97 MIL/uL (ref 3.87–5.11)
RDW: 15.6 % — ABNORMAL HIGH (ref 11.5–15.5)
WBC: 12.2 10*3/uL — ABNORMAL HIGH (ref 4.0–10.5)
nRBC: 0 % (ref 0.0–0.2)

## 2020-07-14 LAB — BRAIN NATRIURETIC PEPTIDE: B Natriuretic Peptide: 1668.8 pg/mL — ABNORMAL HIGH (ref 0.0–100.0)

## 2020-07-14 LAB — RESP PANEL BY RT-PCR (FLU A&B, COVID) ARPGX2
Influenza A by PCR: NEGATIVE
Influenza B by PCR: NEGATIVE
SARS Coronavirus 2 by RT PCR: NEGATIVE

## 2020-07-14 LAB — MRSA PCR SCREENING: MRSA by PCR: NEGATIVE

## 2020-07-14 LAB — POTASSIUM: Potassium: 2.8 mmol/L — ABNORMAL LOW (ref 3.5–5.1)

## 2020-07-14 LAB — TROPONIN I (HIGH SENSITIVITY)
Troponin I (High Sensitivity): 15 ng/L (ref <18)
Troponin I (High Sensitivity): 147 ng/L (ref <18)
Troponin I (High Sensitivity): 305 ng/L (ref <18)
Troponin I (High Sensitivity): 301 ng/L (ref <18)
Troponin I (High Sensitivity): 299 ng/L (ref <18)

## 2020-07-14 LAB — MAGNESIUM
Magnesium: 1.8 mg/dL (ref 1.7–2.4)
Magnesium: 2.3 mg/dL (ref 1.7–2.4)

## 2020-07-14 LAB — PHOSPHORUS: Phosphorus: 1.9 mg/dL — ABNORMAL LOW (ref 2.5–4.6)

## 2020-07-14 MED ORDER — NITROGLYCERIN 0.4 MG SL SUBL
0.4000 mg | SUBLINGUAL_TABLET | SUBLINGUAL | Status: DC | PRN
  Administered 2020-07-14: 0.4 mg via SUBLINGUAL
  Filled 2020-07-14: qty 1

## 2020-07-14 MED ORDER — METOPROLOL SUCCINATE ER 50 MG PO TB24: 100.0000 mg | ORAL_TABLET | Freq: Every day | ORAL | Status: DC

## 2020-07-14 MED ORDER — LABETALOL HCL 200 MG PO TABS
200.0000 mg | ORAL_TABLET | Freq: Three times a day (TID) | ORAL | Status: DC
  Administered 2020-07-14 – 2020-07-15 (×5): 200 mg via ORAL
  Filled 2020-07-14 (×7): qty 1

## 2020-07-14 MED ORDER — POLYETHYLENE GLYCOL 3350 17 G PO PACK: 17.0000 g | PACK | Freq: Every day | ORAL | Status: DC | PRN

## 2020-07-14 MED ORDER — ACETAMINOPHEN 500 MG PO TABS: 1000.0000 mg | ORAL_TABLET | Freq: Once | ORAL | Status: DC

## 2020-07-14 MED ORDER — NICARDIPINE HCL IN NACL 20-0.86 MG/200ML-% IV SOLN
3.0000 mg/h | INTRAVENOUS | Status: DC
  Administered 2020-07-14: 7.5 mg/h via INTRAVENOUS
  Administered 2020-07-14: 5 mg/h via INTRAVENOUS
  Administered 2020-07-14 – 2020-07-15 (×5): 7.5 mg/h via INTRAVENOUS
  Filled 2020-07-14 (×8): qty 200

## 2020-07-14 MED ORDER — IRBESARTAN 150 MG PO TABS
300.0000 mg | ORAL_TABLET | Freq: Every day | ORAL | Status: DC
  Administered 2020-07-15 – 2020-07-16 (×2): 300 mg via ORAL
  Filled 2020-07-14 (×2): qty 2

## 2020-07-14 MED ORDER — CLONIDINE HCL 0.1 MG PO TABS: 0.2000 mg | ORAL_TABLET | Freq: Three times a day (TID) | ORAL | Status: DC

## 2020-07-14 MED ORDER — ONDANSETRON HCL 4 MG/2ML IJ SOLN
4.0000 mg | Freq: Four times a day (QID) | INTRAMUSCULAR | Status: DC | PRN
  Administered 2020-07-14: 4 mg via INTRAVENOUS
  Filled 2020-07-14: qty 2

## 2020-07-14 MED ORDER — POTASSIUM CHLORIDE 10 MEQ/100ML IV SOLN
10.0000 meq | INTRAVENOUS | Status: AC
  Administered 2020-07-14 – 2020-07-15 (×4): 10 meq via INTRAVENOUS
  Filled 2020-07-14 (×4): qty 100

## 2020-07-14 MED ORDER — MAGNESIUM SULFATE 2 GM/50ML IV SOLN
2.0000 g | Freq: Once | INTRAVENOUS | Status: AC
  Administered 2020-07-14: 2 g via INTRAVENOUS
  Filled 2020-07-14: qty 50

## 2020-07-14 MED ORDER — CHLORHEXIDINE GLUCONATE CLOTH 2 % EX PADS
6.0000 | MEDICATED_PAD | Freq: Every day | CUTANEOUS | Status: DC
  Administered 2020-07-14: 6 via TOPICAL

## 2020-07-14 MED ORDER — ATORVASTATIN CALCIUM 10 MG PO TABS
10.0000 mg | ORAL_TABLET | Freq: Every day | ORAL | Status: DC
  Administered 2020-07-15 – 2020-07-17 (×3): 10 mg via ORAL
  Filled 2020-07-14 (×3): qty 1

## 2020-07-14 MED ORDER — HYDRALAZINE HCL 20 MG/ML IJ SOLN
5.0000 mg | Freq: Once | INTRAMUSCULAR | Status: AC
  Administered 2020-07-14: 5 mg via INTRAVENOUS
  Filled 2020-07-14: qty 1

## 2020-07-14 MED ORDER — IOHEXOL 300 MG/ML  SOLN
100.0000 mL | Freq: Once | INTRAMUSCULAR | Status: AC | PRN
  Administered 2020-07-14: 100 mL via INTRAVENOUS

## 2020-07-14 MED ORDER — DOXAZOSIN MESYLATE 4 MG PO TABS
4.0000 mg | ORAL_TABLET | Freq: Every day | ORAL | Status: DC
  Filled 2020-07-14: qty 1

## 2020-07-14 MED ORDER — ENOXAPARIN SODIUM 40 MG/0.4ML ~~LOC~~ SOLN
40.0000 mg | SUBCUTANEOUS | Status: DC
  Administered 2020-07-14 – 2020-07-17 (×4): 40 mg via SUBCUTANEOUS
  Filled 2020-07-14 (×4): qty 0.4

## 2020-07-14 MED ORDER — HYDRALAZINE HCL 20 MG/ML IJ SOLN: 10.0000 mg | Freq: Once | INTRAMUSCULAR | Status: DC

## 2020-07-14 MED ORDER — POTASSIUM PHOSPHATES 15 MMOLE/5ML IV SOLN
30.0000 mmol | Freq: Once | INTRAVENOUS | Status: AC
  Administered 2020-07-14: 30 mmol via INTRAVENOUS
  Filled 2020-07-14: qty 10

## 2020-07-14 MED ORDER — METOPROLOL TARTRATE 50 MG PO TABS
50.0000 mg | ORAL_TABLET | Freq: Two times a day (BID) | ORAL | Status: DC
  Administered 2020-07-14: 50 mg via ORAL
  Filled 2020-07-14: qty 1

## 2020-07-14 MED ORDER — DOCUSATE SODIUM 100 MG PO CAPS
100.0000 mg | ORAL_CAPSULE | Freq: Two times a day (BID) | ORAL | Status: DC | PRN
  Filled 2020-07-14 (×2): qty 1

## 2020-07-14 MED ORDER — SODIUM CHLORIDE 0.9 % IV SOLN
INTRAVENOUS | Status: DC | PRN
  Administered 2020-07-14: 250 mL via INTRAVENOUS

## 2020-07-14 MED ORDER — PANTOPRAZOLE SODIUM 40 MG IV SOLR
40.0000 mg | Freq: Every day | INTRAVENOUS | Status: DC
  Administered 2020-07-14: 40 mg via INTRAVENOUS
  Filled 2020-07-14: qty 40

## 2020-07-14 MED ORDER — IRBESARTAN 150 MG PO TABS
300.0000 mg | ORAL_TABLET | Freq: Every day | ORAL | Status: DC
  Filled 2020-07-14: qty 2

## 2020-07-14 MED ORDER — IRBESARTAN 150 MG PO TABS: 300.0000 mg | ORAL_TABLET | Freq: Every day | ORAL | Status: DC

## 2020-07-14 MED ORDER — INSULIN ASPART 100 UNIT/ML ~~LOC~~ SOLN
0.0000 [IU] | SUBCUTANEOUS | Status: DC
  Administered 2020-07-14: 5 [IU] via SUBCUTANEOUS
  Administered 2020-07-14: 11 [IU] via SUBCUTANEOUS
  Administered 2020-07-14 (×2): 3 [IU] via SUBCUTANEOUS
  Administered 2020-07-15: 2 [IU] via SUBCUTANEOUS
  Filled 2020-07-14 (×5): qty 1

## 2020-07-14 MED ORDER — POTASSIUM CHLORIDE 10 MEQ/100ML IV SOLN: 10.0000 meq | INTRAVENOUS | Status: DC

## 2020-07-14 MED ORDER — POTASSIUM CHLORIDE CRYS ER 20 MEQ PO TBCR
40.0000 meq | EXTENDED_RELEASE_TABLET | Freq: Once | ORAL | Status: AC
  Administered 2020-07-14: 40 meq via ORAL
  Filled 2020-07-14: qty 2

## 2020-07-14 MED ORDER — POTASSIUM CHLORIDE 20 MEQ PO PACK
40.0000 meq | PACK | Freq: Once | ORAL | Status: AC
  Administered 2020-07-14: 40 meq via ORAL
  Filled 2020-07-14: qty 2

## 2020-07-14 MED ORDER — DOXAZOSIN MESYLATE 4 MG PO TABS: 4.0000 mg | ORAL_TABLET | Freq: Every day | ORAL | Status: DC

## 2020-07-14 NOTE — ED Notes (Signed)
Lab at bedside

## 2020-07-14 NOTE — Consult Note (Signed)
PHARMACY CONSULT NOTE - FOLLOW UP  Pharmacy Consult for Electrolyte Monitoring and Replacement   Recent Labs: Potassium (mmol/L)  Date Value  07/14/2020 2.5 (LL)   Magnesium (mg/dL)  Date Value  91/79/1505 1.8   Calcium (mg/dL)  Date Value  69/79/4801 9.5   Albumin (g/dL)  Date Value  65/53/7482 4.2  07/01/2020 4.2   Phosphorus (mg/dL)  Date Value  70/78/6754 1.9 (L)   Sodium (mmol/L)  Date Value  07/14/2020 138  07/01/2020 140     Assessment: Patient here with complaints of nausea, vomiting. Elevated BP, on nicardipine infusion.   Goal of Therapy:  WNL  Plan:  4/17  0419  K 2.5  Mag 1.8  Phos 1.9  Scr 0.86  Na 138  Medical team ordered KCl 40 mEq PO x 1 and Mag.sulfate 2 gm IV x 1.  Potassium Phosphate 30 mmol IV x 1 (44 meq K+) ordered by Medical team but not hung yet (IV issues?). KCl 10 mEq IV x 4 was ordered by RPh but appears not given d/t IV issues? F/u K at 1800 F/u with AM labs.   Angelique Blonder ,PharmD Clinical Pharmacist 07/14/2020 9:23 AM

## 2020-07-14 NOTE — Plan of Care (Signed)
A&O patient from home admitted to ICU bed 6 with hypertensive crisis, titrate Cardene to keep patients SBP within MD parameters.

## 2020-07-14 NOTE — H&P (Signed)
NAME:  Amber Padilla, MRN:  [id number], DOB:  1936/02/24, LOS: 0 ADMISSION DATE:  07/13/2020, CONSULTATION DATE: 07/14/2020 REFERRING MD: Dr. Elesa Massed, CHIEF COMPLAINT:   Nausea and vomiting  History of Present Illness:  85 year old female arriving to the [facility] ED from home with complaints of chills with nausea and vomiting that started on 07/13/2020.  Patient also complains of ongoing sinus congestion with associated headache at 10 out of 10 and intermittent left ear pain. ED course: Initial vitals revealed the patient to also have hypertensive urgency with a BP of 261/123, HR 57, temp 99.6, RR 18 with SPO2 97% on room air. Patient admitted to running out of blood pressure medications for the last 2 days.  She received metoprolol IVP & hydralazine IVP without effect. Nicardipine drip started, with good effect. Significant labs: Hypokalemic at 3.0, mildly anemic at 11.3, CT abdomen showing mild bilateral pelviectasis.  PCCM consulted for admission due to hypertensive urgency requiring nicardipine drip. Pertinent  Medical History  Thyroid disease Hypertension Hyperlipidemia Diabetes mellitus without complication  Significant Hospital Events: Including procedures, antibiotic start and stop dates in addition to other pertinent events   . 07/14/2020-admit to ICU with hypertensive urgency requiring nicardipine drip  Interim History / Subjective:  Patient alert on ED stretcher with daughter bedside, stating she is uncomfortable on the ED stretcher.  Denies nausea currently, last vomiting episode happened pre-11 PM on 07/13/2020.  No current complaints of pain, states she normally has a sinus headache in the mornings 10 out of 10. She also complained of intermittent left ear pain, upon assessment no redness or fluid noted in left ear.  However a good amount of earwax seen. All questions and concerns answered at this time  Objective   Blood pressure (!) 259/97, pulse (!) 58, temperature 98 F (36.7 C),  temperature source Oral, resp. rate 18, height 5\' 4"  (1.626 m), weight 67.6 kg, SpO2 96 %.        Intake/Output Summary (Last 24 hours) at 07/14/2020 0236 Last data filed at 07/14/2020 0231 Gross per 24 hour  Intake 500 ml  Output --  Net 500 ml   Filed Weights   07/13/20 2037  Weight: 67.6 kg    Examination: General: Adult female, critically ill, lying in bed, NAD HEENT: MM pink/moist, anicteric, atraumatic, neck supple Neuro: A&O x 4, able to follow commands, PERRL +3 , MAE CV: s1s2 RRR, sinus tach on monitor, no r/m/g Pulm: Regular, non labored on room air, breath sounds clear-BUL & diminished-BLL GI: soft, rounded, non tender, bs x 4 GU: Pure wick in place with clear yellow urine Skin: Limited exam- no rashes/lesions noted Extremities: warm/dry, pulses + 2 R/P, trace edema noted BLE  Labs/imaging that I have personally reviewed  (right click and "Reselect all SmartList Selections" daily)  EKG Interpretation Date:  07/13/2020 EKG Time:  2252 Rate: 64 Rhythm: Sinus rhythm QRS Axis:  Possible LAD Intervals: Borderline QTc prolongation ST/T Wave abnormalities: None Narrative Interpretation: Sinus rhythm  Na+/ K+: 139/3.0 BUN/Cr.:  23/0.94 Serum CO2/ AG: 27/12  Hgb: 11.3 Troponin: 15  WBC/ TMAX: 9.9/99.6 CXR 07/14/2020: No active disease CT abdomen pelvis 07/14/2020: Mild bilateral pelviectasis with transition at ureteropelvic junction, could reflect mild UPJ obstructions.  No frank hydronephrosis or obstructive uropathy.  Nonobstructing calculi in both left and right renal moiety.  Resolved Hospital Problem list     Assessment & Plan:  Hypertensive Urgency PMHx: Hypertension, thyroid disease Suspect nausea and vomiting is due to severely elevated BP -  continue nicardipine drip: Reduce blood pressure by 25% in first hour, then goal BP less than 160/100 -Continuous cardiac monitoring - restart home BP medications once tolerating PO medications: clonidine, metoprolol  & telmisartan - f/u thyroid panel -Daily BMP, replace electrolytes as needed  Type 2 diabetes mellitus -Every 4 CBG monitoring while n.p.o. -SSI moderate scale -Follow ICU hypo-/hyper glycemia protocol  Hyperlipidemia -Restart Lipitor once tolerating p.o. medications  Best practice (right click and "Reselect all SmartList Selections" daily)  Diet:  NPO Pain/Anxiety/Delirium protocol (if indicated): No VAP protocol (if indicated): Not indicated DVT prophylaxis: LMWH GI prophylaxis: PPI Glucose control:  SSI Yes Central venous access:  N/A Arterial line:  N/A Foley:  N/A Mobility:  bed rest  PT consulted: N/A Last date of multidisciplinary goals of care discussion 07/14/2020 Code Status:  full code Disposition: ICU  Labs   CBC: Recent Labs  Lab 07/13/20 0852  WBC 9.9  HGB 11.3*  HCT 35.2*  MCV 91.9  PLT 207    Basic Metabolic Panel: Recent Labs  Lab 07/13/20 0852  NA 139  K 3.0*  CL 100  CO2 27  GLUCOSE 203*  BUN 23  CREATININE 0.94  CALCIUM 9.4   GFR: Estimated Creatinine Clearance: 43.6 mL/min (by C-G formula based on SCr of 0.94 mg/dL). Recent Labs  Lab 07/13/20 0852  WBC 9.9    Liver Function Tests: Recent Labs  Lab 07/13/20 0852  AST 25  ALT 13  ALKPHOS 68  BILITOT 0.9  PROT 8.8*  ALBUMIN 4.2   Recent Labs  Lab 07/13/20 0852  LIPASE 33   No results for input(s): AMMONIA in the last 168 hours.  ABG No results found for: PHART, PCO2ART, PO2ART, HCO3, TCO2, ACIDBASEDEF, O2SAT   Coagulation Profile: No results for input(s): INR, PROTIME in the last 168 hours.  Cardiac Enzymes: No results for input(s): CKTOTAL, CKMB, CKMBINDEX, TROPONINI in the last 168 hours.  HbA1C: Hemoglobin A1C  Date/Time Value Ref Range Status  03/10/2016 12:00 AM 7.0  Final   Hgb A1c MFr Bld  Date/Time Value Ref Range Status  07/01/2020 11:37 AM 6.6 (H) 4.8 - 5.6 % Final    Comment:             Prediabetes: 5.7 - 6.4          Diabetes: >6.4           Glycemic control for adults with diabetes: <7.0   12/27/2019 10:57 AM 6.5 (H) 4.8 - 5.6 % Final    Comment:             Prediabetes: 5.7 - 6.4          Diabetes: >6.4          Glycemic control for adults with diabetes: <7.0     CBG: No results for input(s): GLUCAP in the last 168 hours.  Review of Systems: Positives in BOLD   Gen: Denies fever, chills, weight change, fatigue, night sweats HEENT: Denies blurred vision, double vision, hearing loss, tinnitus, sinus congestion, rhinorrhea, sore throat, neck stiffness, dysphagia PULM: Denies shortness of breath, cough, sputum production, hemoptysis, wheezing CV: Denies chest pain, edema, orthopnea, paroxysmal nocturnal dyspnea, palpitations GI: Denies abdominal pain, nausea, vomiting, diarrhea, hematochezia, melena, constipation, change in bowel habits GU: Denies dysuria, hematuria, polyuria, oliguria, urethral discharge Endocrine: Denies hot or cold intolerance, polyuria, polyphagia or appetite change Derm: Denies rash, dry skin, scaling or peeling skin change Heme: Denies easy bruising, bleeding, bleeding gums Neuro: Denies headache, numbness,  weakness, slurred speech, loss of memory or consciousness  Past Medical History:  She,  has a past medical history of Allergy, Diabetes mellitus without complication (HCC), Hyperlipidemia, Hypertension, and Thyroid disease.   Surgical History:   Past Surgical History:  Procedure Laterality Date  . BUNIONECTOMY Bilateral 2005     Social History:   reports that she has never smoked. She has never used smokeless tobacco. She reports that she does not drink alcohol and does not use drugs.   Family History:  Her family history includes Diabetes in her mother.   Allergies Allergies  Allergen Reactions  . Norvasc [Amlodipine] Swelling     Home Medications  Prior to Admission medications   Medication Sig Start Date End Date Taking? Authorizing Provider  aspirin EC 81 MG tablet Take 81 mg  by mouth daily.    [provider]  atorvastatin (LIPITOR) 10 MG tablet Take 1 tablet (10 mg total) by mouth daily. 07/05/20   Duanne Limerick, MD  calcium-vitamin D (OSCAL WITH D) 500-200 MG-UNIT tablet Take 1 tablet by mouth.    [provider]  cloNIDine (CATAPRES) 0.2 MG tablet Take 1 tablet (0.2 mg total) by mouth 3 (three) times daily. 07/01/20   Reubin Milan, MD  doxazosin (CARDURA) 4 MG tablet Take 1 tablet (4 mg total) by mouth daily. 07/01/20   Reubin Milan, MD  Magnesium 500 MG CAPS Take by mouth.    [provider]  metFORMIN (GLUCOPHAGE) 500 MG tablet Take 1 tablet (500 mg total) by mouth 2 (two) times daily with a meal. 07/01/20   Reubin Milan, MD  metoprolol succinate (TOPROL-XL) 100 MG 24 hr tablet Take 1 tablet (100 mg total) by mouth daily. Take with or immediately following a meal. 07/01/20   Reubin Milan, MD  telmisartan (MICARDIS) 80 MG tablet Take 1 tablet (80 mg total) by mouth daily. 07/01/20   Reubin Milan, MD     Critical care time: 40 minutes       Betsey Holiday, AGACNP-BC Acute Care Nurse Practitioner [facility]   [phone] / [phone] Please see Amion for pager details.

## 2020-07-14 NOTE — Consult Note (Signed)
PHARMACY CONSULT NOTE - FOLLOW UP  Pharmacy Consult for Electrolyte Monitoring and Replacement   Recent Labs: Potassium (mmol/L)  Date Value  07/13/2020 3.0 (L)   Calcium (mg/dL)  Date Value  99/35/7017 9.4   Albumin (g/dL)  Date Value  79/39/0300 4.2  07/01/2020 4.2   Sodium (mmol/L)  Date Value  07/13/2020 139  07/01/2020 140     Assessment: Patient here with complaints of nausea, vomiting. Elevated BP, on nicardipine infusion.   Goal of Therapy:  WNL  Plan:  Medical team ordered KCl 40 mEq PO x 1. Will order Kcl 10 mEq IV x 4.  F/u with AM labs.   Ronnald Ramp ,PharmD Clinical Pharmacist 07/14/2020 4:46 AM

## 2020-07-14 NOTE — Progress Notes (Signed)
Admitted from ED. Patient is confused but follows commands. Stable. Treated for nausea x 1. Sisters in to visit.

## 2020-07-14 NOTE — ED Notes (Signed)
Called lab to obtain blood work at this time.  

## 2020-07-14 NOTE — ED Notes (Signed)
This RN at bedside to administer IV Magnesium. IV is infiltrated and unable to flush. IV removed at this time.

## 2020-07-14 NOTE — ED Provider Notes (Signed)
  Physical Exam  BP (!) 259/97   Pulse (!) 58   Temp 98 F (36.7 C) (Oral)   Resp 18   Ht 5\' 4"  (1.626 m)   Wt 67.6 kg   SpO2 96%   BMI 25.58 kg/m   Physical Exam  ED Course/Procedures     Procedures  MDM  12:00 AM  Assumed care.  Patient here with complaints of nausea, vomiting.  CT of the abdomen pelvis pending.  Also has hypertensive urgency.  Will require admission.  Receiving metoprolol and hydralazine.  Reports has been out of her blood pressure medications for several days.   2:00 AM  Pt's CT scan shows mild bilateral pelviectasis with transition at the UPJ that could reflect mild UPJ obstructions but there is no hydronephrosis or obstructive uropathy.  Her urine shows no sign of infection today.  She denies having any pain.  Reports she is having chills and feels warm to touch.  Will redose with Tylenol.  No further nausea or vomiting. No signs of bowel obstruction.  Continues to be significantly hypertensive despite metoprolol and hydralazine.  Given heart rate is now in the 50s, I do not feel further beta-blockers are appropriate.  Will start Cardene infusion and discuss with medicine for admission.  2:27 AM Discussed patient's case with ICU NP, .  I have recommended admission and patient (and family if present) agree with this plan. Admitting physician will place admission orders.   I reviewed all nursing notes, vitals, pertinent previous records and reviewed/interpreted all EKGs, lab and urine results, imaging (as available).    CRITICAL CARE Performed by: San Jetty   Total critical care time: 35 minutes  Critical care time was exclusive of separately billable procedures and treating other patients.  Critical care was necessary to treat or prevent imminent or life-threatening deterioration.  Critical care was time spent personally by me on the following activities: development of treatment plan with patient and/or surrogate as well as  nursing, discussions with consultants, evaluation of patient's response to treatment, examination of patient, obtaining history from patient or surrogate, ordering and performing treatments and interventions, ordering and review of laboratory studies, ordering and review of radiographic studies, pulse oximetry and re-evaluation of patient's condition.        Ashlye Oviedo, Rochele Raring, DO 07/14/20 [phone]

## 2020-07-14 NOTE — ED Notes (Signed)
Updated Dr. Arlean Hopping and Anna Genre, NP regarding pt's HR at this time and issues with pt swallowing pills. Per Dr. Arlean Hopping we should keep the Cardene going and make the pt NPO. No others orders received at this moment.

## 2020-07-14 NOTE — ED Notes (Signed)
IV team at bedside at this time. 

## 2020-07-15 ENCOUNTER — Inpatient Hospital Stay (HOSPITAL_COMMUNITY)
Admit: 2020-07-15 | Discharge: 2020-07-15 | Disposition: A | Discharge status: Home / Self Care | Payer: Medicare HMO | Attending: Nurse Practitioner | Admitting: Nurse Practitioner

## 2020-07-15 DIAGNOSIS — I248 Other forms of acute ischemic heart disease: Secondary | ICD-10-CM

## 2020-07-15 LAB — BASIC METABOLIC PANEL
Anion gap: 8 (ref 5–15)
BUN: 31 mg/dL — ABNORMAL HIGH (ref 8–23)
CO2: 24 mmol/L (ref 22–32)
Calcium: 8.9 mg/dL (ref 8.9–10.3)
Chloride: 105 mmol/L (ref 98–111)
Creatinine, Ser: 0.95 mg/dL (ref 0.44–1.00)
GFR, Estimated: 60 mL/min — ABNORMAL LOW (ref >60)
Glucose, Bld: 112 mg/dL — ABNORMAL HIGH (ref 70–99)
Potassium: 4.2 mmol/L (ref 3.5–5.1)
Sodium: 137 mmol/L (ref 135–145)

## 2020-07-15 LAB — CBC
HCT: 32 % — ABNORMAL LOW (ref 36.0–46.0)
Hemoglobin: 10.4 g/dL — ABNORMAL LOW (ref 12.0–15.0)
MCH: 29.9 pg (ref 26.0–34.0)
MCHC: 32.5 g/dL (ref 30.0–36.0)
MCV: 92 fL (ref 80.0–100.0)
Platelets: 169 10*3/uL (ref 150–400)
RBC: 3.48 MIL/uL — ABNORMAL LOW (ref 3.87–5.11)
RDW: 16.5 % — ABNORMAL HIGH (ref 11.5–15.5)
WBC: 18 10*3/uL — ABNORMAL HIGH (ref 4.0–10.5)
nRBC: 0 % (ref 0.0–0.2)

## 2020-07-15 LAB — ECHOCARDIOGRAM COMPLETE
AR max vel: 2.13 cm2
AV Area VTI: 2.39 cm2
AV Area mean vel: 2.16 cm2
AV Mean grad: 8 mmHg
AV Peak grad: 14.7 mmHg
Ao pk vel: 1.92 m/s
Area-P 1/2: 2.57 cm2
Height: 64 in
S' Lateral: 1.96 cm
Weight: 2363.33 oz

## 2020-07-15 LAB — PHOSPHORUS: Phosphorus: 4.2 mg/dL (ref 2.5–4.6)

## 2020-07-15 LAB — GLUCOSE, CAPILLARY
Glucose-Capillary: 109 mg/dL — ABNORMAL HIGH (ref 70–99)
Glucose-Capillary: 115 mg/dL — ABNORMAL HIGH (ref 70–99)
Glucose-Capillary: 130 mg/dL — ABNORMAL HIGH (ref 70–99)
Glucose-Capillary: 161 mg/dL — ABNORMAL HIGH (ref 70–99)
Glucose-Capillary: 136 mg/dL — ABNORMAL HIGH (ref 70–99)

## 2020-07-15 LAB — MAGNESIUM: Magnesium: 2.6 mg/dL — ABNORMAL HIGH (ref 1.7–2.4)

## 2020-07-15 LAB — POTASSIUM: Potassium: 4.4 mmol/L (ref 3.5–5.1)

## 2020-07-15 MED ORDER — INSULIN ASPART 100 UNIT/ML ~~LOC~~ SOLN
0.0000 [IU] | Freq: Three times a day (TID) | SUBCUTANEOUS | Status: DC
  Administered 2020-07-15: 2 [IU] via SUBCUTANEOUS
  Administered 2020-07-15 – 2020-07-16 (×3): 3 [IU] via SUBCUTANEOUS
  Filled 2020-07-15 (×4): qty 1

## 2020-07-15 NOTE — Progress Notes (Addendum)
Better day. Off Cardene drip. More alert and oriented. Wants to go home. Sat up in chair x 2 hours today without issues.1355 Report called to [facility] on 1A. 1410 Transferred via bed to room 134 by NT.

## 2020-07-15 NOTE — Evaluation (Signed)
Physical Therapy Evaluation Patient Details Name: Amber Padilla MRN: [id number] DOB: 03-Mar-1936 Today's Date: 07/15/2020   History of Present Illness  85 year old female arriving to the [facility] ED from home with complaints of chills with nausea and vomiting that started on 07/13/2020.  Patient also complains of ongoing sinus congestion with associated headache at 10 out of 10 and intermittent left ear pain. Initial vitals revealed the patient to also have hypertensive urgency with a BP of 261/123; pt reports running out of BP meds for the last 2 days.  Clinical Impression  Pt seen for PT evaluation with pt able to complete bed mobility with supervision, transfers & gait without AD with CGA. Pt is able to ambulate increased distances but does require CGA 2/2 instability & impaired balance. Pt would benefit from acute PT services to address high level balance training & endurance training. Educated pt & granddaughter on recommendation of supervision for OOB mobility upon d/c with both voicing understanding.   Supine in bed: BP 155/69 mmHg in LUE (MAP 92), HR 76 bpm Sitting in chair at end of session: BP 153/78 mmHg in LUE (MAP 97), HR 72 bpm    Follow Up Recommendations Home health PT;Supervision for mobility/OOB    Equipment Recommendations  Rolling walker with 5" wheels    Recommendations for Other Services       Precautions / Restrictions Precautions Precautions: Fall Restrictions Weight Bearing Restrictions: No      Mobility  Bed Mobility Overal bed mobility: Needs Assistance Bed Mobility: Supine to Sit     Supine to sit: Supervision;HOB elevated     General bed mobility comments: use of bed rails    Transfers Overall transfer level: Needs assistance Equipment used: None Transfers: Sit to/from Stand Sit to Stand: Min guard            Ambulation/Gait Ambulation/Gait assistance: Min guard Gait Distance (Feet): 100 Feet Assistive device: None Gait  Pattern/deviations: Decreased step length - left;Decreased step length - right;Decreased stride length Gait velocity: decreased   General Gait Details: slight lateral instability with pt reporting she feels a little "wobbly"  Stairs            Wheelchair Mobility    Modified Rankin (Stroke Patients Only)       Balance Overall balance assessment: Needs assistance Sitting-balance support: Feet supported;Bilateral upper extremity supported Sitting balance-Leahy Scale: Good     Standing balance support: No upper extremity supported;During functional activity Standing balance-Leahy Scale: Fair                               Pertinent Vitals/Pain Pain Assessment: No/denies pain    Home Living Family/patient expects to be discharged to:: Private residence Living Arrangements: Alone Available Help at Discharge: Family;Available PRN/intermittently Type of Home: House Home Access: Level entry     Home Layout: One level Home Equipment: None      Prior Function Level of Independence: Independent         Comments: Independent mobility without AD. Daughter & granddaughters provide assistance for transportation to appointments & for groceries.     Hand Dominance        Extremity/Trunk Assessment   Upper Extremity Assessment Upper Extremity Assessment: Overall WFL for tasks assessed    Lower Extremity Assessment Lower Extremity Assessment: Generalized weakness       Communication   Communication: HOH  Cognition Arousal/Alertness: Awake/alert Behavior During Therapy: WFL for tasks assessed/performed Overall Cognitive  Status: Within Functional Limits for tasks assessed                                 General Comments: granddaughter present for session; pt & family both very appreciative of PT intervention      General Comments      Exercises     Assessment/Plan    PT Assessment Patient needs continued PT services  PT  Problem List Decreased strength;Decreased mobility;Decreased activity tolerance;Decreased balance       PT Treatment Interventions DME instruction;Therapeutic activities;Modalities;Gait training;Patient/family education;Therapeutic exercise;Balance training;Functional mobility training;Neuromuscular re-education    PT Goals (Current goals can be found in the Care Plan section)  Acute Rehab PT Goals Patient Stated Goal: get better PT Goal Formulation: With patient/family Time For Goal Achievement: 07/29/20 Potential to Achieve Goals: Good    Frequency Min 2X/week   Barriers to discharge Decreased caregiver support      Co-evaluation               AM-PAC PT "6 Clicks" Mobility  Outcome Measure Help needed turning from your back to your side while in a flat bed without using bedrails?: None Help needed moving from lying on your back to sitting on the side of a flat bed without using bedrails?: A Little Help needed moving to and from a bed to a chair (including a wheelchair)?: A Little Help needed standing up from a chair using your arms (e.g., wheelchair or bedside chair)?: A Little Help needed to walk in hospital room?: A Little Help needed climbing 3-5 steps with a railing? : A Little 6 Click Score: 19    End of Session Equipment Utilized During Treatment: Gait belt Activity Tolerance: Patient tolerated treatment well Patient left: in chair;with call bell/phone within reach;with chair alarm set;with family/visitor present Nurse Communication: Mobility status PT Visit Diagnosis: Unsteadiness on feet (R26.81);Difficulty in walking, not elsewhere classified (R26.2);Muscle weakness (generalized) (M62.81)    Time: 4580-9983 PT Time Calculation (min) (ACUTE ONLY): 18 min   Charges:   PT Evaluation $PT Eval Low Complexity: 1 Low          Aleda Grana, PT, DPT 07/15/20, 5:32 PM   Sandi Mariscal 07/15/2020, 5:31 PM

## 2020-07-15 NOTE — TOC Initial Note (Signed)
Transition of Care [facility]) - Initial/Assessment Note    Patient Details  Name: Amber Padilla MRN: [id number] Date of Birth: 19-May-1935  Transition of [facility]) CM/SW Contact:    [place] Cellar, RN Phone Number: 07/15/2020, 11:25 AM  Clinical Narrative:                 Spoke with daughter, Corrie Dandy confirmed patient lives alone and is very independent. Daughter lives very close and assists with transportation and any needs including grocery store or pharmacy. Daughter reports she would like to have [facility] nurse come after discharge for a couple of weeks if able to assess BP and medications after hospitalization. Patient has a BP cuff at her home and daughter uses it often. Daughter very appreciative of TOC involvement.   Expected Discharge Plan: Home w Home Health Services     Patient Goals and CMS Choice Patient states their goals for this hospitalization and ongoing recovery are:: Return home   Choice offered to / list presented to : Adult Children  Expected Discharge Plan and Services Expected Discharge Plan: Home w Home Health Services       Living arrangements for the past 2 months: Single Family Home                                      Prior Living Arrangements/Services Living arrangements for the past 2 months: Single Family Home Lives with:: Self Patient language and need for interpreter reviewed:: Yes Do you feel safe going back to the place where you live?: Yes      Need for Family Participation in Patient Care: Yes (Comment) Care giver support system in place?: Yes (comment)   Criminal Activity/Legal Involvement Pertinent to Current Situation/Hospitalization: No - Comment as needed  Activities of Daily Living Home Assistive Devices/Equipment: None ADL Screening (condition at time of admission) Patient's cognitive ability adequate to safely complete daily activities?: Yes Is the patient deaf or have difficulty hearing?: No Does the patient have difficulty  seeing, even when wearing glasses/contacts?: No Does the patient have difficulty concentrating, remembering, or making decisions?: No Patient able to express need for assistance with ADLs?: Yes Does the patient have difficulty dressing or bathing?: No Independently performs ADLs?: Yes (appropriate for developmental age) Does the patient have difficulty walking or climbing stairs?: No Weakness of Legs: None Weakness of Arms/Hands: None  Permission Sought/Granted                  Emotional Assessment Appearance:: Appears stated age Attitude/Demeanor/Rapport: Engaged Affect (typically observed): Accepting Orientation: : Oriented to Place,Oriented to Self,Oriented to  Time,Oriented to Situation Alcohol / Substance Use: Not Applicable Psych Involvement: No (comment)  Admission diagnosis:  Hypertensive urgency [I16.0] Nausea and vomiting in adult [R11.2] Hypertensive emergency [I16.1] Patient Active Problem List   Diagnosis Date Noted  . Hypertensive emergency 07/14/2020  . Type II diabetes mellitus with complication (HCC) 12/28/2018  . Essential hypertension 12/28/2018  . Hyperlipidemia 12/28/2018   PCP:  Reubin Milan, MD Pharmacy:   [facility] - [place], [place] - [address] [facility] to [facility] [address] [place] [address] Phone: [phone] Fax: [phone]  [id number] - [place], [place] - [address] [place] [address] Phone: [phone] Fax: [phone]     Social Determinants of Health (SDOH) Interventions    Readmission Risk  Interventions No flowsheet data found.

## 2020-07-15 NOTE — Progress Notes (Signed)
*  PRELIMINARY RESULTS* Echocardiogram 2D Echocardiogram has been performed.  Cristela Blue 07/15/2020, 12:24 PM

## 2020-07-15 NOTE — Progress Notes (Signed)
NAME:  Amber Padilla, MRN:  [id number], DOB:  [birth year], LOS: 1 ADMISSION DATE:  07/13/2020, CONSULTATION DATE: 07/14/2020 REFERRING MD: Dr. Elesa Massed, CHIEF COMPLAINT:   Nausea and vomiting  History of Present Illness:  85 year old female arriving to the [facility] ED from home with complaints of chills with nausea and vomiting that started on 07/13/2020.  Patient also complains of ongoing sinus congestion with associated headache at 10 out of 10 and intermittent left ear pain. ED course: Initial vitals revealed the patient to also have hypertensive urgency with a BP of 261/123, HR 57, temp 99.6, RR 18 with SPO2 97% on room air. Patient admitted to running out of blood pressure medications for the last 2 days.  She received metoprolol IVP & hydralazine IVP without effect. Nicardipine drip started, with good effect. Significant labs: Hypokalemic at 3.0, mildly anemic at 11.3, CT abdomen showing mild bilateral pelviectasis.  PCCM consulted for admission due to hypertensive urgency requiring nicardipine drip. Pertinent  Medical History  Thyroid disease Hypertension Hyperlipidemia Diabetes mellitus without complication  Significant Hospital Events: Including procedures, antibiotic start and stop dates in addition to other pertinent events   . 07/14/2020-admit to ICU with hypertensive urgency requiring nicardipine drip  Interim History / Subjective:  Patient alert on ED stretcher with daughter bedside, stating she is uncomfortable on the ED stretcher.  Denies nausea currently, last vomiting episode happened pre-11 PM on 07/13/2020.  No current complaints of pain, states she normally has a sinus headache in the mornings 10 out of 10. She also complained of intermittent left ear pain, upon assessment no redness or fluid noted in left ear.  However a good amount of earwax seen. All questions and concerns answered at this time  Objective   Blood pressure 134/67, pulse 70, temperature 98.6 F (37 C), resp.  rate 17, height 5\' 4"  (1.626 m), weight 67 kg, SpO2 98 %.        Intake/Output Summary (Last 24 hours) at 07/15/2020 0840 Last data filed at 07/15/2020 0500 Gross per 24 hour  Intake 1763.06 ml  Output 500 ml  Net 1263.06 ml   Filed Weights   07/13/20 2037 07/14/20 1305 07/15/20 0113  Weight: 67.6 kg 64.7 kg 67 kg    Examination: General: Adult female, critically ill, lying in bed, NAD HEENT: MM pink/moist, anicteric, atraumatic, neck supple Neuro: A&O x 4, able to follow commands, PERRL +3 , MAE CV: s1s2 RRR, sinus tach on monitor, no r/m/g Pulm: Regular, non labored on room air, breath sounds clear-BUL & diminished-BLL GI: soft, rounded, non tender, bs x 4 GU: Pure wick in place with clear yellow urine Skin: Limited exam- no rashes/lesions noted Extremities: warm/dry, pulses + 2 R/P, trace edema noted BLE  Labs/imaging that I have personally reviewed  (right click and "Reselect all SmartList Selections" daily)  EKG Interpretation Date:  07/13/2020 EKG Time:  2252 Rate: 64 Rhythm: Sinus rhythm QRS Axis:  Possible LAD Intervals: Borderline QTc prolongation ST/T Wave abnormalities: None Narrative Interpretation: Sinus rhythm  Na+/ K+: 139/3.0 BUN/Cr.:  23/0.94 Serum CO2/ AG: 27/12  Hgb: 11.3 Troponin: 15  WBC/ TMAX: 9.9/99.6 CXR 07/14/2020: No active disease CT abdomen pelvis 07/14/2020: Mild bilateral pelviectasis with transition at ureteropelvic junction, could reflect mild UPJ obstructions.  No frank hydronephrosis or obstructive uropathy.  Nonobstructing calculi in both left and right renal moiety.  Resolved Hospital Problem list     Assessment & Plan:  Hypertensive Emergency Elevated troponin PMHx: Hypertension, thyroid disease - Off Cardene this morning,  tolerating PO - Continue labetalol 200 mg TID, irbesartan 300 mg daily - Awaiting TTE - OK to transfer to floor  Hypokalemia, resolved - Continue to monitor lytes, replete as necessary  Type 2 diabetes  mellitus -SSI moderate scale; FSBS ACHDS -Follow ICU hypo-/hyper glycemia protocol  Hyperlipidemia -Continue home Lipitor  Best practice (right click and "Reselect all SmartList Selections" daily)  Diet:  Oral Pain/Anxiety/Delirium protocol (if indicated): No VAP protocol (if indicated): Not indicated DVT prophylaxis: LMWH GI prophylaxis: N/A Glucose control:  SSI Yes Central venous access:  N/A Arterial line:  N/A Foley:  N/A Mobility:  bed rest  PT consulted: N/A Last date of multidisciplinary goals of care discussion 07/14/2020 Code Status:  full code Disposition: Transfer to floor  Labs   CBC: Recent Labs  Lab 07/13/20 0852 07/14/20 0419 07/15/20 0205  WBC 9.9 12.2* 18.0*  HGB 11.3* 11.6* 10.4*  HCT 35.2* 35.9* 32.0*  MCV 91.9 90.4 92.0  PLT 207 217 169    Basic Metabolic Panel: Recent Labs  Lab 07/13/20 0852 07/14/20 0419 07/14/20 1818 07/15/20 0205  NA 139 138 138 137  K 3.0* 2.5* 2.8*  2.8* 4.2  CL 100 101 103 105  CO2 27 25 24 24   GLUCOSE 203* 276* 144* 112*  BUN 23 20 28* 31*  CREATININE 0.94 0.86 0.88 0.95  CALCIUM 9.4 9.5 9.2 8.9  MG  --  1.8 2.3 2.6*  PHOS  --  1.9*  --  4.2   GFR: Estimated Creatinine Clearance: 43 mL/min (by C-G formula based on SCr of 0.95 mg/dL). Recent Labs  Lab 07/13/20 0852 07/14/20 0419 07/15/20 0205  WBC 9.9 12.2* 18.0*    Liver Function Tests: Recent Labs  Lab 07/13/20 0852  AST 25  ALT 13  ALKPHOS 68  BILITOT 0.9  PROT 8.8*  ALBUMIN 4.2   Recent Labs  Lab 07/13/20 0852  LIPASE 33   No results for input(s): AMMONIA in the last 168 hours.  ABG No results found for: PHART, PCO2ART, PO2ART, HCO3, TCO2, ACIDBASEDEF, O2SAT   Coagulation Profile: No results for input(s): INR, PROTIME in the last 168 hours.  Cardiac Enzymes: No results for input(s): CKTOTAL, CKMB, CKMBINDEX, TROPONINI in the last 168 hours.  HbA1C: Hemoglobin A1C  Date/Time Value Ref Range Status  03/10/2016 12:00 AM 7.0   Final   Hgb A1c MFr Bld  Date/Time Value Ref Range Status  07/01/2020 11:37 AM 6.6 (H) 4.8 - 5.6 % Final    Comment:             Prediabetes: 5.7 - 6.4          Diabetes: >6.4          Glycemic control for adults with diabetes: <7.0   12/27/2019 10:57 AM 6.5 (H) 4.8 - 5.6 % Final    Comment:             Prediabetes: 5.7 - 6.4          Diabetes: >6.4          Glycemic control for adults with diabetes: <7.0     CBG: Recent Labs  Lab 07/14/20 1619 07/14/20 1955 07/14/20 2325 07/15/20 0407 07/15/20 0712  GLUCAP 199* 110* 144* 109* 115*    Review of Systems: Positives in BOLD   Complete review of systems performed. Pertinent findings noted above.  Past Medical History:  She,  has a past medical history of Allergy, Diabetes mellitus without complication (HCC), Hyperlipidemia, Hypertension, and Thyroid disease.  Surgical History:   Past Surgical History:  Procedure Laterality Date  . BUNIONECTOMY Bilateral 2005     Social History:   reports that she has never smoked. She has never used smokeless tobacco. She reports that she does not drink alcohol and does not use drugs.   Family History:  Her family history includes Diabetes in her mother.   Allergies Allergies  Allergen Reactions  . Norvasc [Amlodipine] Swelling     Home Medications  Prior to Admission medications   Medication Sig Start Date End Date Taking? Authorizing Provider  aspirin EC 81 MG tablet Take 81 mg by mouth daily.    [provider]  atorvastatin (LIPITOR) 10 MG tablet Take 1 tablet (10 mg total) by mouth daily. 07/05/20   Duanne Limerick, MD  calcium-vitamin D (OSCAL WITH D) 500-200 MG-UNIT tablet Take 1 tablet by mouth.    [provider]  cloNIDine (CATAPRES) 0.2 MG tablet Take 1 tablet (0.2 mg total) by mouth 3 (three) times daily. 07/01/20   Reubin Milan, MD  doxazosin (CARDURA) 4 MG tablet Take 1 tablet (4 mg total) by mouth daily. 07/01/20   Reubin Milan, MD   Magnesium 500 MG CAPS Take by mouth.    [provider]  metFORMIN (GLUCOPHAGE) 500 MG tablet Take 1 tablet (500 mg total) by mouth 2 (two) times daily with a meal. 07/01/20   Reubin Milan, MD  metoprolol succinate (TOPROL-XL) 100 MG 24 hr tablet Take 1 tablet (100 mg total) by mouth daily. Take with or immediately following a meal. 07/01/20   Reubin Milan, MD  telmisartan (MICARDIS) 80 MG tablet Take 1 tablet (80 mg total) by mouth daily. 07/01/20   Reubin Milan, MD      Marcelo Baldy, MD 07/15/20 8:41 AM   [facility]  [phone] / [phone] Please see Amion for pager details.

## 2020-07-15 NOTE — Progress Notes (Signed)
   07/15/20 1100  Clinical Encounter Type  Visited With Patient and family together  Visit Type Initial;Spiritual support;Social support  Referral From Nurse  Consult/Referral To Chaplain   Chaplain visited with PT and her granddaughter. Chaplain ministered with spiritual and emotional support. The Granddaughter stated she was grateful that someone was checking on her grandmother.

## 2020-07-16 DIAGNOSIS — E876 Hypokalemia: Secondary | ICD-10-CM

## 2020-07-16 DIAGNOSIS — R112 Nausea with vomiting, unspecified: Secondary | ICD-10-CM

## 2020-07-16 DIAGNOSIS — I160 Hypertensive urgency: Secondary | ICD-10-CM

## 2020-07-16 DIAGNOSIS — E119 Type 2 diabetes mellitus without complications: Secondary | ICD-10-CM

## 2020-07-16 LAB — IRON AND TIBC
Iron Saturation: 41 % (ref 15–55)
Iron: 91 ug/dL (ref 27–139)
Total Iron Binding Capacity: 221 ug/dL — ABNORMAL LOW (ref 250–450)
UIBC: 130 ug/dL (ref 118–369)

## 2020-07-16 LAB — GLUCOSE, CAPILLARY
Glucose-Capillary: 153 mg/dL — ABNORMAL HIGH (ref 70–99)
Glucose-Capillary: 125 mg/dL — ABNORMAL HIGH (ref 70–99)
Glucose-Capillary: 87 mg/dL (ref 70–99)
Glucose-Capillary: 152 mg/dL — ABNORMAL HIGH (ref 70–99)
Glucose-Capillary: 113 mg/dL — ABNORMAL HIGH (ref 70–99)

## 2020-07-16 LAB — FERRITIN: Ferritin: 126 ng/mL (ref 15–150)

## 2020-07-16 LAB — THYROID PANEL WITH TSH
Free Thyroxine Index: 2.7 (ref 1.2–4.9)
T3 Uptake Ratio: 29 % (ref 24–39)
T4, Total: 9.4 ug/dL (ref 4.5–12.0)
TSH: 0.986 u[IU]/mL (ref 0.450–4.500)

## 2020-07-16 LAB — SPECIMEN STATUS REPORT

## 2020-07-16 MED ORDER — CLONIDINE HCL 0.1 MG PO TABS
0.2000 mg | ORAL_TABLET | Freq: Two times a day (BID) | ORAL | Status: DC
  Administered 2020-07-16 – 2020-07-17 (×2): 0.2 mg via ORAL
  Filled 2020-07-16 (×2): qty 2

## 2020-07-16 MED ORDER — CLONIDINE HCL 0.2 MG PO TABS: 0.2000 mg | ORAL_TABLET | Freq: Two times a day (BID) | ORAL | 1 refills | Status: DC

## 2020-07-16 MED ORDER — METFORMIN HCL 500 MG PO TABS
500.0000 mg | ORAL_TABLET | Freq: Two times a day (BID) | ORAL | Status: DC
  Administered 2020-07-16 – 2020-07-17 (×2): 500 mg via ORAL
  Filled 2020-07-16 (×2): qty 1

## 2020-07-16 MED ORDER — DEXTROSE 50 % IV SOLN
INTRAVENOUS | Status: AC
  Filled 2020-07-16: qty 50

## 2020-07-16 MED ORDER — DOXAZOSIN MESYLATE 4 MG PO TABS
4.0000 mg | ORAL_TABLET | Freq: Every day | ORAL | Status: DC
  Administered 2020-07-16 – 2020-07-17 (×2): 4 mg via ORAL
  Filled 2020-07-16 (×2): qty 1

## 2020-07-16 MED ORDER — ASPIRIN EC 81 MG PO TBEC
81.0000 mg | DELAYED_RELEASE_TABLET | Freq: Every day | ORAL | Status: DC
  Administered 2020-07-16 – 2020-07-17 (×2): 81 mg via ORAL
  Filled 2020-07-16 (×2): qty 1

## 2020-07-16 MED ORDER — CALCIUM CARBONATE-VITAMIN D 500-200 MG-UNIT PO TABS
1.0000 | ORAL_TABLET | Freq: Every day | ORAL | Status: DC
  Administered 2020-07-17: 1 via ORAL
  Filled 2020-07-16: qty 1

## 2020-07-16 MED ORDER — DEXTROSE 50 % IV SOLN: 25.0000 mL | Freq: Once | INTRAVENOUS | Status: DC

## 2020-07-16 MED ORDER — METOPROLOL SUCCINATE ER 50 MG PO TB24
100.0000 mg | ORAL_TABLET | Freq: Every day | ORAL | Status: DC
  Administered 2020-07-16: 100 mg via ORAL
  Filled 2020-07-16 (×2): qty 2

## 2020-07-16 MED ORDER — CLONIDINE HCL 0.1 MG PO TABS
0.2000 mg | ORAL_TABLET | Freq: Three times a day (TID) | ORAL | Status: DC
  Administered 2020-07-16: 0.2 mg via ORAL
  Filled 2020-07-16: qty 2

## 2020-07-16 NOTE — Discharge Summary (Signed)
[facility] [place] [facility]   PATIENT NAME: Amber Padilla    MR#:  [id number]  DATE OF BIRTH:  02-27-1936  DATE OF ADMISSION:  07/13/2020 ADMITTING PHYSICIAN: Marcelo Baldy, MD  DATE OF DISCHARGE: 07/16/2020  PRIMARY CARE PHYSICIAN: Reubin Milan, MD    ADMISSION DIAGNOSIS:  Hypertensive urgency [I16.0] Nausea and vomiting in adult [R11.2] Hypertensive emergency [I16.1]  DISCHARGE DIAGNOSIS:  Hypertensive Urgency  SECONDARY DIAGNOSIS:   Past Medical History:  Diagnosis Date  . Allergy   . Diabetes mellitus without complication (HCC)   . Hyperlipidemia   . Hypertension   . Thyroid disease     HOSPITAL COURSE:  85 year old female arriving to the [facility] ED from home with complaints of chills with nausea and vomiting that started on 07/13/2020.  Patient also complains of ongoing sinus congestion with associated headache at 10 out of 10 and intermittent left ear pain.  Hypertensive Urgency Elevated troponin PMHx: Hypertension, thyroid disease - Off Cardene gtt  since 4/17 tolerating PO -patient apparently had received her mailing order for blood pressure meds couple days late likely cause rebound hypertension. Resumed back her clonidine, Telmisartan and metoprolol at discharge. -  TTE shows EF of 70 to 75% with LVH and grade one diastolic dysfunction -pressure much better. Largest blood pressure was 126/59 --BNP elevated however patient asymptomatic Sater hundred percent on room air. Chest x-ray no cardiovascular congestion noted. -- Patient advised keep log of blood pressure at home and review with PCP Dr. Judithann Graves  Hypokalemia, resolved -repeated  Type 2 diabetes mellitus --SSI moderate scale -- resumed metformin  Hyperlipidemia -Continue home Lipitor  Physical therapy recommends home health. Discussed with patient's daughter on the phone discharge plan and she is in agreement.  CONSULTS OBTAINED:    DRUG ALLERGIES:    Allergies  Allergen Reactions  . Norvasc [Amlodipine] Swelling    DISCHARGE MEDICATIONS:   Allergies as of 07/16/2020      Reactions   Norvasc [amlodipine] Swelling      Medication List    TAKE these medications   acetaminophen 500 MG tablet Commonly known as: TYLENOL Take 1,000 mg by mouth every 6 (six) hours as needed.   aspirin EC 81 MG tablet Take 81 mg by mouth daily.   atorvastatin 10 MG tablet Commonly known as: LIPITOR Take 1 tablet (10 mg total) by mouth daily.   calcium-vitamin D 500-200 MG-UNIT tablet Commonly known as: OSCAL WITH D Take 1 tablet by mouth.   cloNIDine 0.2 MG tablet Commonly known as: CATAPRES Take 1 tablet (0.2 mg total) by mouth 2 (two) times daily. What changed: when to take this   doxazosin 4 MG tablet Commonly known as: CARDURA Take 1 tablet (4 mg total) by mouth daily.   Magnesium 500 MG Caps Take by mouth.   metFORMIN 500 MG tablet Commonly known as: GLUCOPHAGE Take 1 tablet (500 mg total) by mouth 2 (two) times daily with a meal.   metoprolol succinate 100 MG 24 hr tablet Commonly known as: TOPROL-XL Take 1 tablet (100 mg total) by mouth daily. Take with or immediately following a meal.   telmisartan 80 MG tablet Commonly known as: MICARDIS Take 1 tablet (80 mg total) by mouth daily.            Durable Medical Equipment  (From admission, onward)         Start     Ordered   07/16/20 1106  For home use only DME Walker rolling  Once       Question Answer Comment  Walker: With 5 Inch Wheels   Patient needs a walker to treat with the following condition Weakness      07/16/20 1105          If you experience worsening of your admission symptoms, develop shortness of breath, life threatening emergency, suicidal or homicidal thoughts you must seek medical attention immediately by calling 911 or calling your MD immediately  if symptoms less severe.  You Must read complete instructions/literature along with all  the possible adverse reactions/side effects for all the Medicines you take and that have been prescribed to you. Take any new Medicines after you have completely understood and accept all the possible adverse reactions/side effects.   Please note  You were cared for by a hospitalist during your hospital stay. If you have any questions about your discharge medications or the care you received while you were in the hospital after you are discharged, you can call the unit and asked to speak with the hospitalist on call if the hospitalist that took care of you is not available. Once you are discharged, your primary care physician will handle any further medical issues. Please note that NO REFILLS for any discharge medications will be authorized once you are discharged, as it is imperative that you return to your primary care physician (or establish a relationship with a primary care physician if you do not have one) for your aftercare needs so that they can reassess your need for medications and monitor your lab values. Today   SUBJECTIVE   Patient ate breakfast however does not feel she is eating enough. No nausea vomiting. Denies any chest pain or abdominal pain. Denies shortness of breath.  VITAL SIGNS:  Blood pressure (!) 126/59, pulse (!) 54, temperature 98.9 F (37.2 C), resp. rate 16, height 5\' 4"  (1.626 m), weight 66.1 kg, SpO2 100 %.  I/O:    Intake/Output Summary (Last 24 hours) at 07/16/2020 1329 Last data filed at 07/16/2020 1014 Gross per 24 hour  Intake 480 ml  Output --  Net 480 ml    PHYSICAL EXAMINATION:  GENERAL:  85 y.o.-year-old patient lying in the bed with no acute distress.  LUNGS: Normal breath sounds bilaterally, no wheezing, rales,rhonchi or crepitation. No use of accessory muscles of respiration.  CARDIOVASCULAR: S1, S2 normal. No murmurs, rubs, or gallops.  ABDOMEN: Soft, non-tender, non-distended. Bowel sounds present. No organomegaly or mass.  EXTREMITIES: No  pedal edema, cyanosis, or clubbing.  NEUROLOGIC: Cranial nerves II through XII are intact. Muscle strength 5/5 in all extremities. Sensation intact. Gait not checked.  PSYCHIATRIC: The patient is alert and oriented x 3.  SKIN: No obvious rash, lesion, or ulcer.   DATA REVIEW:   CBC  Recent Labs  Lab 07/15/20 0205  WBC 18.0*  HGB 10.4*  HCT 32.0*  PLT 169    Chemistries  Recent Labs  Lab 07/13/20 0852 07/14/20 0419 07/15/20 0205 07/15/20 0832  NA 139   < > 137  --   K 3.0*   < > 4.2 4.4  CL 100   < > 105  --   CO2 27   < > 24  --   GLUCOSE 203*   < > 112*  --   BUN 23   < > 31*  --   CREATININE 0.94   < > 0.95  --   CALCIUM 9.4   < > 8.9  --   MG  --    < >  2.6*  --   AST 25  --   --   --   ALT 13  --   --   --   ALKPHOS 68  --   --   --   BILITOT 0.9  --   --   --    < > = values in this interval not displayed.    Microbiology Results   Recent Results (from the past 240 hour(s))  Resp Panel by RT-PCR (Flu A&B, Covid) Nasopharyngeal Swab     Status: None   Collection Time: 07/13/20 11:25 PM   Specimen: Nasopharyngeal Swab; Nasopharyngeal(NP) swabs in vial transport medium  Result Value Ref Range Status   SARS Coronavirus 2 by RT PCR NEGATIVE NEGATIVE Final    Comment: (NOTE) SARS-CoV-2 target nucleic acids are NOT DETECTED.  The SARS-CoV-2 RNA is generally detectable in upper respiratory specimens during the acute phase of infection. The lowest concentration of SARS-CoV-2 viral copies this assay can detect is 138 copies/mL. A negative result does not preclude SARS-Cov-2 infection and should not be used as the sole basis for treatment or other patient management decisions. A negative result may occur with  improper specimen collection/handling, submission of specimen other than nasopharyngeal swab, presence of viral mutation(s) within the areas targeted by this assay, and inadequate number of viral copies(<138 copies/mL). A negative result must be combined  with clinical observations, patient history, and epidemiological information. The expected result is Negative.  Fact Sheet for Patients:  BloggerCourse.com  Fact Sheet for Healthcare Providers:  SeriousBroker.it  This test is no t yet approved or cleared by the [place] FDA and  has been authorized for detection and/or diagnosis of SARS-CoV-2 by FDA under an Emergency Use Authorization (EUA). This EUA will remain  in effect (meaning this test can be used) for the duration of the COVID-19 declaration under Section 564(b)(1) of the Act, 21 U.S.C.section 360bbb-3(b)(1), unless the authorization is terminated  or revoked sooner.       Influenza A by PCR NEGATIVE NEGATIVE Final   Influenza B by PCR NEGATIVE NEGATIVE Final    Comment: (NOTE) The Xpert Xpress SARS-CoV-2/FLU/RSV plus assay is intended as an aid in the diagnosis of influenza from Nasopharyngeal swab specimens and should not be used as a sole basis for treatment. Nasal washings and aspirates are unacceptable for Xpert Xpress SARS-CoV-2/FLU/RSV testing.  Fact Sheet for Patients: BloggerCourse.com  Fact Sheet for Healthcare Providers: SeriousBroker.it  This test is not yet approved or cleared by the [place] FDA and has been authorized for detection and/or diagnosis of SARS-CoV-2 by FDA under an Emergency Use Authorization (EUA). This EUA will remain in effect (meaning this test can be used) for the duration of the COVID-19 declaration under Section 564(b)(1) of the Act, 21 U.S.C. section 360bbb-3(b)(1), unless the authorization is terminated or revoked.  Performed at [facility], [address] [place], [place] [address]   MRSA PCR Screening     Status: None   Collection Time: 07/14/20  1:05 PM   Specimen: Nasopharyngeal  Result Value Ref Range Status   MRSA by PCR NEGATIVE NEGATIVE Final     Comment:        The GeneXpert MRSA Assay (FDA approved for NASAL specimens only), is one component of a comprehensive MRSA colonization surveillance program. It is not intended to diagnose MRSA infection nor to guide or monitor treatment for MRSA infections. Performed at [facility], [address], [place], [place] [address]  RADIOLOGY:  ECHOCARDIOGRAM COMPLETE  Result Date: 07/15/2020    ECHOCARDIOGRAM REPORT   Patient Name:   Amber Padilla Date of Exam: 07/15/2020 Medical Rec #:  [id number]      Height:       64.0 in Accession #:    [id number]     Weight:       147.7 lb Date of Birth:  1935/11/09      BSA:          1.720 m Patient Age:    84 years       BP:           134/67 mmHg Patient Gender: F              HR:           70 bpm. Exam Location:  [facility] Procedure: 2D Echo, Cardiac Doppler, Color Doppler and Strain Analysis Indications:     Acute ischemic heart disease--unspecified I24.9  History:         Patient has no prior history of Echocardiogram examinations.                  Risk Factors:Hypertension, Dyslipidemia and Diabetes.  Sonographer:     Cristela Blue RDCS (AE) Referring Phys:  [id number] [facility] OUMA Diagnosing Phys: Harold Hedge MD IMPRESSIONS  1. Left ventricular ejection fraction, by estimation, is 70 to 75%. The left ventricle has hyperdynamic function. The left ventricle has no regional wall motion abnormalities. There is moderate left ventricular hypertrophy. Left ventricular diastolic parameters are consistent with Grade I diastolic dysfunction (impaired relaxation).  2. Right ventricular systolic function is normal. The right ventricular size is normal.  3. The mitral valve is grossly normal. Trivial mitral valve regurgitation.  4. Tricuspid valve regurgitation is mild to moderate.  5. The aortic valve is tricuspid. Aortic valve regurgitation is not visualized. FINDINGS  Left Ventricle: Left ventricular ejection fraction, by estimation, is 70 to 75%. The left  ventricle has hyperdynamic function. The left ventricle has no regional wall motion abnormalities. The left ventricular internal cavity size was normal in size. There is moderate left ventricular hypertrophy. Left ventricular diastolic parameters are consistent with Grade I diastolic dysfunction (impaired relaxation). Right Ventricle: The right ventricular size is normal. No increase in right ventricular wall thickness. Right ventricular systolic function is normal. Left Atrium: Left atrial size was normal in size. Right Atrium: Right atrial size was normal in size. Pericardium: There is no evidence of pericardial effusion. Mitral Valve: The mitral valve is grossly normal. Trivial mitral valve regurgitation. Tricuspid Valve: The tricuspid valve is grossly normal. Tricuspid valve regurgitation is mild to moderate. Aortic Valve: The aortic valve is tricuspid. Aortic valve regurgitation is not visualized. Aortic valve mean gradient measures 8.0 mmHg. Aortic valve peak gradient measures 14.7 mmHg. Aortic valve area, by VTI measures 2.39 cm. Pulmonic Valve: The pulmonic valve was grossly normal. Pulmonic valve regurgitation is not visualized. Aorta: The aortic root is normal in size and structure. IAS/Shunts: No atrial level shunt detected by color flow Doppler.  LEFT VENTRICLE PLAX 2D LVIDd:         3.44 cm  Diastology LVIDs:         1.96 cm  LV e' medial:    4.13 cm/s LV PW:         2.02 cm  LV E/e' medial:  16.0 LV IVS:        1.09 cm  LV e' lateral:   3.59 cm/s LVOT diam:  2.00 cm  LV E/e' lateral: 18.4 LV SV:         99 LV SV Index:   57 LVOT Area:     3.14 cm  RIGHT VENTRICLE RV Basal diam:  3.65 cm RV S prime:     15.90 cm/s TAPSE (M-mode): 4.6 cm LEFT ATRIUM             Index       RIGHT ATRIUM           Index LA diam:        3.20 cm 1.86 cm/m  RA Area:     20.80 cm LA Vol (A2C):   58.1 ml 33.78 ml/m RA Volume:   60.80 ml  35.35 ml/m LA Vol (A4C):   30.9 ml 17.97 ml/m LA Biplane Vol: 46.4 ml 26.98 ml/m   AORTIC VALVE                    PULMONIC VALVE AV Area (Vmax):    2.13 cm     PV Vmax:        1.20 m/s AV Area (Vmean):   2.16 cm     PV Peak grad:   5.8 mmHg AV Area (VTI):     2.39 cm     RVOT Peak grad: 5 mmHg AV Vmax:           192.00 cm/s AV Vmean:          134.000 cm/s AV VTI:            0.412 m AV Peak Grad:      14.7 mmHg AV Mean Grad:      8.0 mmHg LVOT Vmax:         130.00 cm/s LVOT Vmean:        92.100 cm/s LVOT VTI:          0.314 m LVOT/AV VTI ratio: 0.76  AORTA Ao Root diam: 2.80 cm MITRAL VALVE               TRICUSPID VALVE MV Area (PHT): 2.57 cm    TR Peak grad:   42.0 mmHg MV Decel Time: 295 msec    TR Vmax:        324.00 cm/s MV E velocity: 66.00 cm/s MV A velocity: 88.30 cm/s  SHUNTS MV E/A ratio:  0.75        Systemic VTI:  0.31 m                            Systemic Diam: 2.00 cm Harold Hedge MD Electronically signed by Harold Hedge MD Signature Date/Time: 07/15/2020/1:22:24 PM    Final      CODE STATUS:     Code Status Orders  (From admission, onward)         Start     Ordered   07/14/20 0230  Full code  Continuous        07/14/20 0230        Code Status History    This patient has a current code status but no historical code status.   Advance Care Planning Activity    Advance Directive Documentation   Flowsheet Row Most Recent Value  Type of Advance Directive Living will, Healthcare Power of Attorney  Pre-existing out of facility DNR order (yellow form or pink MOST form) --  "MOST" Form in Place? --       TOTAL TIME TAKING  CARE OF THIS PATIENT: *40* minutes.    Enedina Finner M.D  [facility]    CC: Primary care physician; Reubin Milan, MD

## 2020-07-16 NOTE — TOC Progression Note (Signed)
Transition of Care [facility]) - Progression Note    Patient Details  Name: Amber Padilla MRN: [id number] Date of Birth: 02-Jul-1935  [facility] of [facility]) CM/SW Contact  Barrie Dunker, RN Phone Number: 07/16/2020, 1:49 PM  Clinical Narrative:    Spoke with the daughter Amber Padilla for the patient to get a RW to take home, [facility], Corrie Dandy helps with transportation and will be checking on the patient regularly  Expected Discharge Plan: Home w Home Health Services    Expected Discharge Plan and Services Expected Discharge Plan: Home w Home Health Services       Living arrangements for the past 2 months: Single Family Home Expected Discharge Date: 07/16/20                                     Social Determinants of Health (SDOH) Interventions    Readmission Risk Interventions No flowsheet data found.

## 2020-07-16 NOTE — Progress Notes (Signed)
Patient ID: Amber Padilla, female   DOB: February 03, 1936, 85 y.o.   MRN: [id number]  Discharge held. Pt difficult to arouse BP 115/59 sugar 125 at noon and 87 Received 1 amp of d50 and nper RN awake and walked to the BR  Eating fruit. Give NS 250 cc bolus D/w sister and dter

## 2020-07-16 NOTE — Progress Notes (Signed)
RN entered pt room, prepared to discharge. Pt's 2 sisters were at bedside. Iv was dc'd, tip intact. Pt was un arousable , she was heavily perspirating, vss, bs 82. The pt would move her arms and push away RN but not open her eyes. MD Allena Katz made aware and she came to bedside immediately. She verbally ordered 1/2 amp of D50 iv. Rosey Bath charge nurse pulled the medication.  RN started new iv in left hand and administered medication. With in 10 minutes the pt was awake, oob to the bathroom and sitting on the edge of bed talking and eating fruit. The pt's bed linen and gown were changed. The pt states she could hear everything going on but couldn't respond.

## 2020-07-17 LAB — GLUCOSE, CAPILLARY: Glucose-Capillary: 84 mg/dL (ref 70–99)

## 2020-07-17 NOTE — Discharge Instructions (Signed)
Keep log of your BP at home and review with PCP

## 2020-07-17 NOTE — Discharge Summary (Signed)
[facility] [place] [facility]   PATIENT NAME: Amber Padilla    MR#:  [id number]  DATE OF BIRTH:  17-Nov-1935  DATE OF ADMISSION:  07/13/2020 ADMITTING PHYSICIAN: Marcelo Baldy, MD  DATE OF DISCHARGE: 07/17/2020  PRIMARY CARE PHYSICIAN: Reubin Milan, MD    ADMISSION DIAGNOSIS:  Hypertensive urgency [I16.0] Nausea and vomiting in adult [R11.2] Hypertensive emergency [I16.1]  DISCHARGE DIAGNOSIS:  Hypertensive Urgency  SECONDARY DIAGNOSIS:   Past Medical History:  Diagnosis Date  . Allergy   . Diabetes mellitus without complication (HCC)   . Hyperlipidemia   . Hypertension   . Thyroid disease     HOSPITAL COURSE:  85 year old female arriving to the [facility] ED from home with complaints of chills with nausea and vomiting that started on 07/13/2020.  Patient also complains of ongoing sinus congestion with associated headache at 10 out of 10 and intermittent left ear pain.  Hypertensive Urgency Elevated troponin PMHx: Hypertension, thyroid disease - Off Cardene gtt  since 4/17 tolerating PO -patient apparently had received her mailing order for blood pressure meds couple days late likely caused rebound hypertension. Resumed back her clonidine, Telmisartan and metoprolol at discharge. -  TTE shows EF of 70 to 75% with LVH and grade one diastolic dysfunction -pressure much better. Largest blood pressure was 126/59 --BNP elevated however patient asymptomatic Sater hundred percent on room air. Chest x-ray no cardiovascular congestion noted. -- Patient advised keep log of blood pressure at home and review with PCP Dr. Judithann Graves  Hypokalemia, resolved -repleted  Type 2 diabetes mellitus --SSI moderate scale -- resumed metformin  Hyperlipidemia -Continue home Lipitor  Physical therapy recommends home health. Discussed with patient's daughter on the phone discharge plan and she is in agreement.  CONSULTS OBTAINED:    DRUG ALLERGIES:    Allergies  Allergen Reactions  . Norvasc [Amlodipine] Swelling    DISCHARGE MEDICATIONS:   Allergies as of 07/17/2020      Reactions   Norvasc [amlodipine] Swelling      Medication List    TAKE these medications   acetaminophen 500 MG tablet Commonly known as: TYLENOL Take 1,000 mg by mouth every 6 (six) hours as needed.   aspirin EC 81 MG tablet Take 81 mg by mouth daily.   atorvastatin 10 MG tablet Commonly known as: LIPITOR Take 1 tablet (10 mg total) by mouth daily.   calcium-vitamin D 500-200 MG-UNIT tablet Commonly known as: OSCAL WITH D Take 1 tablet by mouth.   cloNIDine 0.2 MG tablet Commonly known as: CATAPRES Take 1 tablet (0.2 mg total) by mouth 2 (two) times daily. What changed: when to take this Notes to patient: Hold dose if BP <130 and/or HR <50   doxazosin 4 MG tablet Commonly known as: CARDURA Take 1 tablet (4 mg total) by mouth daily.   Magnesium 500 MG Caps Take by mouth.   metFORMIN 500 MG tablet Commonly known as: GLUCOPHAGE Take 1 tablet (500 mg total) by mouth 2 (two) times daily with a meal. Notes to patient: Hold if BP <130 and or HR <50   metoprolol succinate 100 MG 24 hr tablet Commonly known as: TOPROL-XL Take 1 tablet (100 mg total) by mouth daily. Take with or immediately following a meal.   telmisartan 80 MG tablet Commonly known as: MICARDIS Take 1 tablet (80 mg total) by mouth daily.            Durable Medical Equipment  (From admission, onward)  Start     Ordered   07/16/20 1106  For home use only DME Walker rolling  Once       Question Answer Comment  Walker: With 5 Inch Wheels   Patient needs a walker to treat with the following condition Weakness      07/16/20 1105          If you experience worsening of your admission symptoms, develop shortness of breath, life threatening emergency, suicidal or homicidal thoughts you must seek medical attention immediately by calling 911 or calling your MD  immediately  if symptoms less severe.  You Must read complete instructions/literature along with all the possible adverse reactions/side effects for all the Medicines you take and that have been prescribed to you. Take any new Medicines after you have completely understood and accept all the possible adverse reactions/side effects.   Please note  You were cared for by a hospitalist during your hospital stay. If you have any questions about your discharge medications or the care you received while you were in the hospital after you are discharged, you can call the unit and asked to speak with the hospitalist on call if the hospitalist that took care of you is not available. Once you are discharged, your primary care physician will handle any further medical issues. Please note that NO REFILLS for any discharge medications will be authorized once you are discharged, as it is imperative that you return to your primary care physician (or establish a relationship with a primary care physician if you do not have one) for your aftercare needs so that they can reassess your need for medications and monitor your lab values. Today   SUBJECTIVE   Ambulating with walker in the room. Eating BF VITAL SIGNS:  Blood pressure (!) 194/80, pulse (!) 54, temperature 98.8 F (37.1 C), resp. rate 17, height 5\' 4"  (1.626 m), weight 66 kg, SpO2 99 %.  I/O:    Intake/Output Summary (Last 24 hours) at 07/17/2020 0803 Last data filed at 07/16/2020 1014 Gross per 24 hour  Intake 240 ml  Output --  Net 240 ml    PHYSICAL EXAMINATION:  GENERAL:  85 y.o.-year-old patient lying in the bed with no acute distress.  LUNGS: Normal breath sounds bilaterally, no wheezing, rales,rhonchi or crepitation. No use of accessory muscles of respiration.  CARDIOVASCULAR: S1, S2 normal. No murmurs, rubs, or gallops.  ABDOMEN: Soft, non-tender, non-distended. Bowel sounds present. No organomegaly or mass.  EXTREMITIES: No pedal edema,  cyanosis, or clubbing.  NEUROLOGIC: Cranial nerves II through XII are intact. Muscle strength 5/5 in all extremities. Sensation intact. Gait not checked.  PSYCHIATRIC: The patient is alert and oriented x 3.  SKIN: No obvious rash, lesion, or ulcer.   DATA REVIEW:   CBC  Recent Labs  Lab 07/15/20 0205  WBC 18.0*  HGB 10.4*  HCT 32.0*  PLT 169    Chemistries  Recent Labs  Lab 07/13/20 0852 07/14/20 0419 07/15/20 0205 07/15/20 0832  NA 139   < > 137  --   K 3.0*   < > 4.2 4.4  CL 100   < > 105  --   CO2 27   < > 24  --   GLUCOSE 203*   < > 112*  --   BUN 23   < > 31*  --   CREATININE 0.94   < > 0.95  --   CALCIUM 9.4   < > 8.9  --  MG  --    < > 2.6*  --   AST 25  --   --   --   ALT 13  --   --   --   ALKPHOS 68  --   --   --   BILITOT 0.9  --   --   --    < > = values in this interval not displayed.    Microbiology Results   Recent Results (from the past 240 hour(s))  Resp Panel by RT-PCR (Flu A&B, Covid) Nasopharyngeal Swab     Status: None   Collection Time: 07/13/20 11:25 PM   Specimen: Nasopharyngeal Swab; Nasopharyngeal(NP) swabs in vial transport medium  Result Value Ref Range Status   SARS Coronavirus 2 by RT PCR NEGATIVE NEGATIVE Final    Comment: (NOTE) SARS-CoV-2 target nucleic acids are NOT DETECTED.  The SARS-CoV-2 RNA is generally detectable in upper respiratory specimens during the acute phase of infection. The lowest concentration of SARS-CoV-2 viral copies this assay can detect is 138 copies/mL. A negative result does not preclude SARS-Cov-2 infection and should not be used as the sole basis for treatment or other patient management decisions. A negative result may occur with  improper specimen collection/handling, submission of specimen other than nasopharyngeal swab, presence of viral mutation(s) within the areas targeted by this assay, and inadequate number of viral copies(<138 copies/mL). A negative result must be combined with clinical  observations, patient history, and epidemiological information. The expected result is Negative.  Fact Sheet for Patients:  BloggerCourse.com  Fact Sheet for Healthcare Providers:  SeriousBroker.it  This test is no t yet approved or cleared by the [place] FDA and  has been authorized for detection and/or diagnosis of SARS-CoV-2 by FDA under an Emergency Use Authorization (EUA). This EUA will remain  in effect (meaning this test can be used) for the duration of the COVID-19 declaration under Section 564(b)(1) of the Act, 21 U.S.C.section 360bbb-3(b)(1), unless the authorization is terminated  or revoked sooner.       Influenza A by PCR NEGATIVE NEGATIVE Final   Influenza B by PCR NEGATIVE NEGATIVE Final    Comment: (NOTE) The Xpert Xpress SARS-CoV-2/FLU/RSV plus assay is intended as an aid in the diagnosis of influenza from Nasopharyngeal swab specimens and should not be used as a sole basis for treatment. Nasal washings and aspirates are unacceptable for Xpert Xpress SARS-CoV-2/FLU/RSV testing.  Fact Sheet for Patients: BloggerCourse.com  Fact Sheet for Healthcare Providers: SeriousBroker.it  This test is not yet approved or cleared by the [place] FDA and has been authorized for detection and/or diagnosis of SARS-CoV-2 by FDA under an Emergency Use Authorization (EUA). This EUA will remain in effect (meaning this test can be used) for the duration of the COVID-19 declaration under Section 564(b)(1) of the Act, 21 U.S.C. section 360bbb-3(b)(1), unless the authorization is terminated or revoked.  Performed at [facility], [address] [place], [place] [address]   MRSA PCR Screening     Status: None   Collection Time: 07/14/20  1:05 PM   Specimen: Nasopharyngeal  Result Value Ref Range Status   MRSA by PCR NEGATIVE NEGATIVE Final    Comment:         The GeneXpert MRSA Assay (FDA approved for NASAL specimens only), is one component of a comprehensive MRSA colonization surveillance program. It is not intended to diagnose MRSA infection nor to guide or monitor treatment for MRSA infections. Performed at [facility], [phone]  [address] [place], [place] [address]     RADIOLOGY:  ECHOCARDIOGRAM COMPLETE  Result Date: 07/15/2020    ECHOCARDIOGRAM REPORT   Patient Name:   BIANA HAGGAR Date of Exam: 07/15/2020 Medical Rec #:  [id number]      Height:       64.0 in Accession #:    [id number]     Weight:       147.7 lb Date of Birth:  [birth year]      BSA:          1.720 m Patient Age:    84 years       BP:           134/67 mmHg Patient Gender: F              HR:           70 bpm. Exam Location:  [facility] Procedure: 2D Echo, Cardiac Doppler, Color Doppler and Strain Analysis Indications:     Acute ischemic heart disease--unspecified I24.9  History:         Patient has no prior history of Echocardiogram examinations.                  Risk Factors:Hypertension, Dyslipidemia and Diabetes.  Sonographer:     Cristela Blue RDCS (AE) Referring Phys:  [id number] [facility] OUMA Diagnosing Phys: Harold Hedge MD IMPRESSIONS  1. Left ventricular ejection fraction, by estimation, is 70 to 75%. The left ventricle has hyperdynamic function. The left ventricle has no regional wall motion abnormalities. There is moderate left ventricular hypertrophy. Left ventricular diastolic parameters are consistent with Grade I diastolic dysfunction (impaired relaxation).  2. Right ventricular systolic function is normal. The right ventricular size is normal.  3. The mitral valve is grossly normal. Trivial mitral valve regurgitation.  4. Tricuspid valve regurgitation is mild to moderate.  5. The aortic valve is tricuspid. Aortic valve regurgitation is not visualized. FINDINGS  Left Ventricle: Left ventricular ejection fraction, by estimation, is 70 to 75%. The left ventricle has  hyperdynamic function. The left ventricle has no regional wall motion abnormalities. The left ventricular internal cavity size was normal in size. There is moderate left ventricular hypertrophy. Left ventricular diastolic parameters are consistent with Grade I diastolic dysfunction (impaired relaxation). Right Ventricle: The right ventricular size is normal. No increase in right ventricular wall thickness. Right ventricular systolic function is normal. Left Atrium: Left atrial size was normal in size. Right Atrium: Right atrial size was normal in size. Pericardium: There is no evidence of pericardial effusion. Mitral Valve: The mitral valve is grossly normal. Trivial mitral valve regurgitation. Tricuspid Valve: The tricuspid valve is grossly normal. Tricuspid valve regurgitation is mild to moderate. Aortic Valve: The aortic valve is tricuspid. Aortic valve regurgitation is not visualized. Aortic valve mean gradient measures 8.0 mmHg. Aortic valve peak gradient measures 14.7 mmHg. Aortic valve area, by VTI measures 2.39 cm. Pulmonic Valve: The pulmonic valve was grossly normal. Pulmonic valve regurgitation is not visualized. Aorta: The aortic root is normal in size and structure. IAS/Shunts: No atrial level shunt detected by color flow Doppler.  LEFT VENTRICLE PLAX 2D LVIDd:         3.44 cm  Diastology LVIDs:         1.96 cm  LV e' medial:    4.13 cm/s LV PW:         2.02 cm  LV E/e' medial:  16.0 LV IVS:        1.09 cm  LV e' lateral:   3.59 cm/s LVOT diam:     2.00 cm  LV E/e' lateral: 18.4 LV SV:         99 LV SV Index:   57 LVOT Area:     3.14 cm  RIGHT VENTRICLE RV Basal diam:  3.65 cm RV S prime:     15.90 cm/s TAPSE (M-mode): 4.6 cm LEFT ATRIUM             Index       RIGHT ATRIUM           Index LA diam:        3.20 cm 1.86 cm/m  RA Area:     20.80 cm LA Vol (A2C):   58.1 ml 33.78 ml/m RA Volume:   60.80 ml  35.35 ml/m LA Vol (A4C):   30.9 ml 17.97 ml/m LA Biplane Vol: 46.4 ml 26.98 ml/m  AORTIC VALVE                     PULMONIC VALVE AV Area (Vmax):    2.13 cm     PV Vmax:        1.20 m/s AV Area (Vmean):   2.16 cm     PV Peak grad:   5.8 mmHg AV Area (VTI):     2.39 cm     RVOT Peak grad: 5 mmHg AV Vmax:           192.00 cm/s AV Vmean:          134.000 cm/s AV VTI:            0.412 m AV Peak Grad:      14.7 mmHg AV Mean Grad:      8.0 mmHg LVOT Vmax:         130.00 cm/s LVOT Vmean:        92.100 cm/s LVOT VTI:          0.314 m LVOT/AV VTI ratio: 0.76  AORTA Ao Root diam: 2.80 cm MITRAL VALVE               TRICUSPID VALVE MV Area (PHT): 2.57 cm    TR Peak grad:   42.0 mmHg MV Decel Time: 295 msec    TR Vmax:        324.00 cm/s MV E velocity: 66.00 cm/s MV A velocity: 88.30 cm/s  SHUNTS MV E/A ratio:  0.75        Systemic VTI:  0.31 m                            Systemic Diam: 2.00 cm Harold HedgeKenneth Fath MD Electronically signed by Harold HedgeKenneth Fath MD Signature Date/Time: 07/15/2020/1:22:24 PM    Final      CODE STATUS:     Code Status Orders  (From admission, onward)         Start     Ordered   07/14/20 0230  Full code  Continuous        07/14/20 0230        Code Status History    This patient has a current code status but no historical code status.   Advance Care Planning Activity    Advance Directive Documentation   Flowsheet Row Most Recent Value  Type of Advance Directive Living will, Healthcare Power of Attorney  Pre-existing out of facility DNR order (yellow form or pink MOST form) --  "MOST"  Form in Place? --       TOTAL TIME TAKING CARE OF THIS PATIENT: *40* minutes.    Enedina Finner M.D  [facility]    CC: Primary care physician; Reubin Milan, MD

## 2020-07-17 NOTE — Care Management Important Message (Signed)
Important Message  Patient Details  Name: Amber Padilla MRN: [id number] Date of Birth: [birth year]   Medicare Important Message Given:  Yes     Olegario Messier A [name] 07/17/2020, 10:13 AM

## 2020-07-17 NOTE — Progress Notes (Signed)
Pt d/c home via daughters personal vehicle.  D/c paperwork reviewed with pt and she expressed understanding.  IV removed from pts L hand without issue.  All belongings packed and taken at time of d/c.  NAD and VSS.

## 2020-07-17 NOTE — TOC Transition Note (Signed)
Transition of Care [facility]) - CM/SW Discharge Note   Patient Details  Name: Amber Padilla MRN: [id number] Date of Birth: [birth year]  [facility] CM/SW Contact:  Barrie Dunker, RN Phone Number: 07/17/2020, 8:43 AM   Clinical Narrative:   Patient to DC home with [facility] for Home health  RW brought to the room to take home provided by Trinidad Curet to help with Transportation         Patient Goals and CMS Choice Patient states their goals for this hospitalization and ongoing recovery are:: Return home   Choice offered to / list presented to : Adult Children  Discharge Placement                       Discharge Plan and Services                                     Social Determinants of Health (SDOH) Interventions     Readmission Risk Interventions No flowsheet data found.

## 2020-07-18 ENCOUNTER — Telehealth: Payer: Self-pay

## 2020-07-18 NOTE — Telephone Encounter (Signed)
Transition Care Management Follow-up Telephone Call  Date of discharge and from where: 07/17/20 [facility]  How have you been since you were released from the hospital? Pt doing okay  Any questions or concerns? No  Items Reviewed:  Did the pt receive and understand the discharge instructions provided? Yes   Medications obtained and verified? Yes   Other? No   Any new allergies since your discharge? No   Dietary orders reviewed? Yes  Do you have support at home? Yes   Home Care and Equipment/Supplies: Were home health services ordered? yes If so, what is the name of the agency? [facility]  Has the agency set up a time to come to the patient's home? yes Were any new equipment or medical supplies ordered?  Yes: walker What is the name of the medical supply agency? [employer] Were you able to get the supplies/equipment? yes Do you have any questions related to the use of the equipment or supplies? No  Functional Questionnaire: (I = Independent and D = Dependent) ADLs: I with assistance  Bathing/Dressing- D  Meal Prep- D  Eating- I  Maintaining continence- I  Transferring/Ambulation- I  Managing Meds- I   Follow up appointments reviewed:   PCP Hospital f/u appt confirmed? Yes  Scheduled to see Dr. Judithann Graves on 08/07/20- will need to be seen earlier to comply with TCM 14 day window.  Are transportation arrangements needed? No   If their condition worsens, is the pt aware to call PCP or go to the Emergency Dept.? Yes  Was the patient provided with contact information for the PCP's office or ED? Yes  Was to pt encouraged to call back with questions or concerns? Yes

## 2020-07-19 NOTE — Telephone Encounter (Signed)
For hospital follow up it is ESSENTIAL for the appt to be 40 min long and within 14 days of discharge.  I give you permission to use 2 of my same day spots in the morning to meet these requirements.

## 2020-07-22 ENCOUNTER — Telehealth: Payer: Self-pay

## 2020-07-22 NOTE — Telephone Encounter (Signed)
Called and gave verbal orders for PT for patient to [facility].

## 2020-07-22 NOTE — Telephone Encounter (Unsigned)
Copied from CRM [id number]. Topic: Quick Communication - Home Health Verbal Orders >> Jul 22, 2020 10:15 AM Aretta Nip wrote: Caller/Agency[facility] Callback [phone] Requesting /PT/ Frequency:2 times a wk for 4 weeks and 1 time a wk for 2 wks

## 2020-07-25 ENCOUNTER — Ambulatory Visit (INDEPENDENT_AMBULATORY_CARE_PROVIDER_SITE_OTHER): Payer: Medicare HMO | Admitting: Internal Medicine

## 2020-07-25 ENCOUNTER — Other Ambulatory Visit: Payer: Self-pay

## 2020-07-25 ENCOUNTER — Encounter: Payer: Self-pay | Admitting: Internal Medicine

## 2020-07-25 VITALS — BP 148/88 | HR 61 | Temp 98.4°F | Ht 64.0 in | Wt 153.0 lb

## 2020-07-25 DIAGNOSIS — Q631 Lobulated, fused and horseshoe kidney: Secondary | ICD-10-CM

## 2020-07-25 DIAGNOSIS — I10 Essential (primary) hypertension: Secondary | ICD-10-CM | POA: Diagnosis not present

## 2020-07-25 DIAGNOSIS — E876 Hypokalemia: Secondary | ICD-10-CM

## 2020-07-25 DIAGNOSIS — E119 Type 2 diabetes mellitus without complications: Secondary | ICD-10-CM

## 2020-07-25 DIAGNOSIS — N200 Calculus of kidney: Secondary | ICD-10-CM | POA: Insufficient documentation

## 2020-07-25 DIAGNOSIS — I700 Atherosclerosis of aorta: Secondary | ICD-10-CM | POA: Insufficient documentation

## 2020-07-25 NOTE — Progress Notes (Signed)
Date:  07/25/2020   Name:  Amber Padilla   DOB:  [birth year]   MRN:  [id number]   Chief Complaint: Hospitalization Follow-up (Pt feels better than she did ) Hospital follow up.  Admitted to [facility] 07/13/20 to 07/17/20.  Received a TOC call on 07/18/20. HOSPITAL COURSE:  85 year old female arriving to the [facility] ED from home with complaints of chills with nausea and vomiting that started on 07/13/2020.Patient also complains of ongoing sinus congestion with associated headache at 10 out of 10 and intermittent left ear pain.  HypertensiveUrgency Elevated troponin PMHx: Hypertension, thyroid disease - Off Cardene gtt  since 4/17 tolerating PO -patient apparently had received her mailing order for blood pressure meds couple days late likely caused rebound hypertension. Resumed back her clonidine, Telmisartan and metoprolol at discharge. -  TTE shows EF of 70 to 75% with LVH and grade one diastolic dysfunction -pressure much better. Largest blood pressure was 126/59 --BNP elevated however patient asymptomatic Sater hundred percent on room air. Chest x-ray no cardiovascular congestion noted. -- Patient advised keep log of blood pressure at home and review with PCP Dr. Judithann Graves >>> BP at home have been 135-150 systolic.  She is feeling much better, eating more.  No headaches.  Abdominal symptoms resolved. CT abd/pelvis reviewed - horseshoe kidneys with tiny stones bilaterally.  Calcified uterine fibroids. Aortic atherosclerosis and coronary atherosclerosis.  Broad ventral rectus diastasis. Hypokalemia, resolved -repleted >>> was not discharged on oral potassium.  Several K+ levels were normal prior to discharge. Type 2 diabetes mellitus --SSI moderate scale -- resumed metformin >>> last A1C was normal.  Glucoses were normal in the hospital. Hyperlipidemia -Continue home Lipitor >>> Physical therapy recommends home health. PT is coming to the home and patient is participating well in  exercise program.  She has some mild knee pain but this is tolerable.  HPI  Lab Results  Component Value Date   CREATININE 0.95 07/15/2020   BUN 31 (H) 07/15/2020   NA 137 07/15/2020   K 4.4 07/15/2020   CL 105 07/15/2020   CO2 24 07/15/2020   Lab Results  Component Value Date   CHOL 222 (H) 07/01/2020   HDL 61 07/01/2020   LDLCALC 144 (H) 07/01/2020   TRIG 95 07/01/2020   CHOLHDL 3.6 07/01/2020   Lab Results  Component Value Date   TSH 0.986 07/14/2020   Lab Results  Component Value Date   HGBA1C 6.6 (H) 07/01/2020   Lab Results  Component Value Date   WBC 18.0 (H) 07/15/2020   HGB 10.4 (L) 07/15/2020   HCT 32.0 (L) 07/15/2020   MCV 92.0 07/15/2020   PLT 169 07/15/2020   Lab Results  Component Value Date   ALT 13 07/13/2020   AST 25 07/13/2020   ALKPHOS 68 07/13/2020   BILITOT 0.9 07/13/2020     Review of Systems  Constitutional: Negative for chills, diaphoresis, fatigue and unexpected weight change.  HENT: Negative for nosebleeds and trouble swallowing.   Eyes: Negative for visual disturbance.  Respiratory: Negative for cough, chest tightness, shortness of breath and wheezing.   Cardiovascular: Negative for chest pain, palpitations and leg swelling.  Gastrointestinal: Negative for abdominal pain, constipation and diarrhea.  Musculoskeletal: Positive for arthralgias and gait problem.  Skin: Negative for rash.  Neurological: Negative for dizziness, weakness, light-headedness and headaches.  Psychiatric/Behavioral: Negative for dysphoric mood and sleep disturbance. The patient is not nervous/anxious.     Patient Active Problem List   Diagnosis Date Noted  .  Hypokalemia   . Type 2 diabetes mellitus without complication, without long-term current use of insulin (HCC) 12/28/2018  . Essential hypertension 12/28/2018  . Hyperlipidemia 12/28/2018    Allergies  Allergen Reactions  . Norvasc [Amlodipine] Swelling    Past Surgical History:  Procedure  Laterality Date  . BUNIONECTOMY Bilateral 2005    Social History   Tobacco Use  . Smoking status: Never Smoker  . Smokeless tobacco: Never Used  Vaping Use  . Vaping Use: Never used  Substance Use Topics  . Alcohol use: Never  . Drug use: Never     Medication list has been reviewed and updated.  Current Meds  Medication Sig  . acetaminophen (TYLENOL) 500 MG tablet Take 1,000 mg by mouth every 6 (six) hours as needed.  Marland Kitchen aspirin EC 81 MG tablet Take 81 mg by mouth daily.  Marland Kitchen atorvastatin (LIPITOR) 10 MG tablet Take 1 tablet (10 mg total) by mouth daily.  Marland Kitchen CALCIUM PO Take 600 mg by mouth.  . calcium-vitamin D (OSCAL WITH D) 500-200 MG-UNIT tablet Take 1 tablet by mouth.  . Cholecalciferol (VITAMIN D3 PO) Take 1,000 Units by mouth.  . cloNIDine (CATAPRES) 0.2 MG tablet Take 1 tablet (0.2 mg total) by mouth 2 (two) times daily.  Marland Kitchen doxazosin (CARDURA) 4 MG tablet Take 1 tablet (4 mg total) by mouth daily.  . ferrous sulfate 324 MG TBEC Take 324 mg by mouth daily.  . Magnesium 500 MG CAPS Take by mouth.  . metFORMIN (GLUCOPHAGE) 500 MG tablet Take 1 tablet (500 mg total) by mouth 2 (two) times daily with a meal.  . metoprolol succinate (TOPROL-XL) 100 MG 24 hr tablet Take 1 tablet (100 mg total) by mouth daily. Take with or immediately following a meal.  . telmisartan (MICARDIS) 80 MG tablet Take 1 tablet (80 mg total) by mouth daily.  . vitamin B-12 (CYANOCOBALAMIN) 500 MCG tablet Take 500 mcg by mouth daily.    PHQ 2/9 Scores 07/25/2020 07/01/2020 12/06/2019 06/27/2019  PHQ - 2 Score 0 0 0 0  PHQ- 9 Score 1 0 - 0    GAD 7 : Generalized Anxiety Score 07/25/2020 07/01/2020 06/27/2019  Nervous, Anxious, on Edge 0 0 0  Control/stop worrying 0 0 0  Worry too much - different things 0 0 0  Trouble relaxing 0 0 0  Restless 0 0 0  Easily annoyed or irritable 0 0 0  Afraid - awful might happen 0 0 0  Total GAD 7 Score 0 0 0  Anxiety Difficulty - - Not difficult at all    BP Readings  from Last 3 Encounters:  07/25/20 (!) 148/88  07/17/20 (!) 161/85  07/01/20 (!) 146/88    Physical Exam Vitals and nursing note reviewed.  Constitutional:      General: She is not in acute distress.    Appearance: Normal appearance. She is well-developed.  HENT:     Head: Normocephalic and atraumatic.  Neck:     Vascular: No carotid bruit.  Cardiovascular:     Rate and Rhythm: Normal rate and regular rhythm.     Pulses: Normal pulses.     Heart sounds: No murmur heard. No gallop.   Pulmonary:     Effort: Pulmonary effort is normal. No respiratory distress.     Breath sounds: No wheezing or rhonchi.  Musculoskeletal:     Cervical back: Normal range of motion.     Right lower leg: No edema.     Left lower  leg: No edema.  Lymphadenopathy:     Cervical: No cervical adenopathy.  Skin:    General: Skin is warm and dry.     Capillary Refill: Capillary refill takes less than 2 seconds.     Findings: No rash.  Neurological:     General: No focal deficit present.     Mental Status: She is alert and oriented to person, place, and time.  Psychiatric:        Mood and Affect: Mood normal.        Behavior: Behavior normal.     Wt Readings from Last 3 Encounters:  07/25/20 153 lb (69.4 kg)  07/17/20 145 lb 9.6 oz (66 kg)  07/01/20 149 lb (67.6 kg)    BP (!) 148/88   Pulse 61   Temp 98.4 F (36.9 C) (Oral)   Ht 5\' 4"  (1.626 m)   Wt 153 lb (69.4 kg)   SpO2 98%   BMI 26.26 kg/m   Assessment and Plan: 1. Essential hypertension BP fairly well controlled.  Will aim for systolic < 150. Continue home monitoring.  If needed, will increase Clonidine to 0.3 mg bid  Follow up in three months - CBC with Differential/Platelet  2. Hypokalemia Supplemented in hospital. - Basic metabolic panel  3. Type 2 diabetes mellitus without complication, without long-term current use of insulin (HCC) BS have been well controlled. Continue current regimen  4. Horseshoe kidney with renal  calculus Discussed with patient and daughter . No workup or further evaluation is required.  5. Aortic atherosclerosis (HCC) On high intensity statin therapy.   Partially dictated using Corrie Dandy. Any errors are unintentional.  Animal nutritionist, MD [facility]  07/25/2020

## 2020-07-26 LAB — CBC WITH DIFFERENTIAL/PLATELET
Basophils Absolute: 0.1 10*3/uL (ref 0.0–0.2)
Basos: 1 %
EOS (ABSOLUTE): 0.3 10*3/uL (ref 0.0–0.4)
Eos: 5 %
Hematocrit: 32.1 % — ABNORMAL LOW (ref 34.0–46.6)
Hemoglobin: 10.4 g/dL — ABNORMAL LOW (ref 11.1–15.9)
Immature Grans (Abs): 0 10*3/uL (ref 0.0–0.1)
Immature Granulocytes: 0 %
Lymphocytes Absolute: 2 10*3/uL (ref 0.7–3.1)
Lymphs: 29 %
MCH: 29.8 pg (ref 26.6–33.0)
MCHC: 32.4 g/dL (ref 31.5–35.7)
MCV: 92 fL (ref 79–97)
Monocytes Absolute: 0.5 10*3/uL (ref 0.1–0.9)
Monocytes: 8 %
Neutrophils Absolute: 3.8 10*3/uL (ref 1.4–7.0)
Neutrophils: 57 %
Platelets: 222 10*3/uL (ref 150–450)
RBC: 3.49 x10E6/uL — ABNORMAL LOW (ref 3.77–5.28)
RDW: 15.2 % (ref 11.7–15.4)
WBC: 6.7 10*3/uL (ref 3.4–10.8)

## 2020-07-26 LAB — BASIC METABOLIC PANEL
BUN/Creatinine Ratio: 12 (ref 12–28)
BUN: 12 mg/dL (ref 8–27)
CO2: 27 mmol/L (ref 20–29)
Calcium: 10.1 mg/dL (ref 8.7–10.3)
Chloride: 102 mmol/L (ref 96–106)
Creatinine, Ser: 1.02 mg/dL — ABNORMAL HIGH (ref 0.57–1.00)
Glucose: 90 mg/dL (ref 65–99)
Potassium: 5.1 mmol/L (ref 3.5–5.2)
Sodium: 143 mmol/L (ref 134–144)
eGFR: 55 mL/min/{1.73_m2} — ABNORMAL LOW (ref >59)

## 2020-08-07 ENCOUNTER — Ambulatory Visit: Payer: Medicare HMO | Admitting: Internal Medicine

## 2020-08-12 ENCOUNTER — Other Ambulatory Visit (INDEPENDENT_AMBULATORY_CARE_PROVIDER_SITE_OTHER): Payer: Medicare HMO | Admitting: Internal Medicine

## 2020-08-12 DIAGNOSIS — E119 Type 2 diabetes mellitus without complications: Secondary | ICD-10-CM

## 2020-08-12 DIAGNOSIS — Z9181 History of falling: Secondary | ICD-10-CM

## 2020-08-12 DIAGNOSIS — I10 Essential (primary) hypertension: Secondary | ICD-10-CM | POA: Diagnosis not present

## 2020-08-12 DIAGNOSIS — Z7984 Long term (current) use of oral hypoglycemic drugs: Secondary | ICD-10-CM

## 2020-08-12 DIAGNOSIS — Z7982 Long term (current) use of aspirin: Secondary | ICD-10-CM

## 2020-08-12 DIAGNOSIS — E079 Disorder of thyroid, unspecified: Secondary | ICD-10-CM

## 2020-08-12 DIAGNOSIS — E782 Mixed hyperlipidemia: Secondary | ICD-10-CM

## 2020-08-12 NOTE — Progress Notes (Signed)
Received orders from [facility]. Start of care 07/18/20.   Orders from 07/18/20 through 09/15/20 are reviewed, signed and faxed.

## 2020-09-16 ENCOUNTER — Other Ambulatory Visit: Payer: Self-pay

## 2020-09-16 ENCOUNTER — Telehealth: Payer: Self-pay

## 2020-09-16 DIAGNOSIS — E782 Mixed hyperlipidemia: Secondary | ICD-10-CM

## 2020-09-16 MED ORDER — ATORVASTATIN CALCIUM 10 MG PO TABS: 10.0000 mg | ORAL_TABLET | Freq: Every day | ORAL | 0 refills | Status: DC

## 2020-09-16 NOTE — Telephone Encounter (Signed)
Sent in RF foro 90 with 0 Refills until patients upcoming appt in July.

## 2020-09-16 NOTE — Telephone Encounter (Signed)
Copied from CRM [id number]. Topic: General - Other >> Sep 16, 2020 11:19 AM Amber Padilla wrote: Reason for CRM: Pt stated she received a call from [facility] informing her that the Rx for atorvastatin (LIPITOR) 10 MG tablet is on hold until they can speak with Dr. Judithann Graves. Pt requests that Dr. Judithann Graves contact [facility]

## 2020-10-16 ENCOUNTER — Other Ambulatory Visit: Payer: Self-pay

## 2020-10-16 ENCOUNTER — Telehealth: Payer: Self-pay

## 2020-10-16 MED ORDER — BLOOD GLUCOSE METER KIT: PACK | 0 refills | Status: AC

## 2020-10-16 NOTE — Telephone Encounter (Signed)
Sent in glucose monitor, strips, and lancets to [facility].

## 2020-10-16 NOTE — Telephone Encounter (Signed)
Copied from CRM [id number]. Topic: General - Other >> Oct 16, 2020  1:56 PM Glean Salen wrote: Reason for XJO:ITGPQDI called in asked about getting new one touch machine ordered to check her sugar, says the one she has is outdated. Please call back

## 2020-10-16 NOTE — Progress Notes (Signed)
Sent in patient generic glucose monitor kit as requested. [facility]

## 2020-10-23 ENCOUNTER — Telehealth: Payer: Self-pay | Admitting: Internal Medicine

## 2020-10-23 ENCOUNTER — Other Ambulatory Visit: Payer: Self-pay

## 2020-10-23 ENCOUNTER — Encounter: Payer: Self-pay | Admitting: Internal Medicine

## 2020-10-23 ENCOUNTER — Ambulatory Visit (INDEPENDENT_AMBULATORY_CARE_PROVIDER_SITE_OTHER): Payer: Medicare HMO | Admitting: Internal Medicine

## 2020-10-23 VITALS — BP 142/84 | HR 64 | Ht 64.0 in | Wt 145.4 lb

## 2020-10-23 DIAGNOSIS — I700 Atherosclerosis of aorta: Secondary | ICD-10-CM | POA: Diagnosis not present

## 2020-10-23 DIAGNOSIS — E119 Type 2 diabetes mellitus without complications: Secondary | ICD-10-CM | POA: Diagnosis not present

## 2020-10-23 DIAGNOSIS — I10 Essential (primary) hypertension: Secondary | ICD-10-CM | POA: Diagnosis not present

## 2020-10-23 NOTE — Progress Notes (Signed)
Date:  10/23/2020   Name:  Amber Padilla   DOB:  14-Aug-1935   MRN:  [id number]   Chief Complaint: Hypertension and Diabetes  Hypertension This is a chronic problem. The problem is controlled (home readings 135/75 average). Pertinent negatives include no chest pain, headaches, palpitations or shortness of breath. Past treatments include angiotensin blockers, beta blockers, calcium channel blockers and direct vasodilators. The current treatment provides significant improvement. There is no history of kidney disease, CAD/MI or CVA.  Diabetes She presents for her follow-up diabetic visit. She has type 2 diabetes mellitus. Her disease course has been stable. Pertinent negatives for hypoglycemia include no dizziness, headaches or tremors. Pertinent negatives for diabetes include no chest pain, no fatigue, no polydipsia, no polyuria and no weakness. Pertinent negatives for diabetic complications include no CVA. Current diabetic treatment includes oral agent (monotherapy) (metformin). Her weight is stable. She monitors blood glucose at home 3-4 x per week. Her breakfast blood glucose is taken between 7-8 am. Her breakfast blood glucose range is generally 110-130 mg/dl. An ACE inhibitor/angiotensin II receptor blocker is being taken.   Lab Results  Component Value Date   CREATININE 1.02 (H) 07/25/2020   BUN 12 07/25/2020   NA 143 07/25/2020   K 5.1 07/25/2020   CL 102 07/25/2020   CO2 27 07/25/2020   Lab Results  Component Value Date   CHOL 222 (H) 07/01/2020   HDL 61 07/01/2020   LDLCALC 144 (H) 07/01/2020   TRIG 95 07/01/2020   CHOLHDL 3.6 07/01/2020   Lab Results  Component Value Date   TSH 0.986 07/14/2020   Lab Results  Component Value Date   HGBA1C 6.6 (H) 07/01/2020   Lab Results  Component Value Date   WBC 6.7 07/25/2020   HGB 10.4 (L) 07/25/2020   HCT 32.1 (L) 07/25/2020   MCV 92 07/25/2020   PLT 222 07/25/2020   Lab Results  Component Value Date   ALT 13 07/13/2020    AST 25 07/13/2020   ALKPHOS 68 07/13/2020   BILITOT 0.9 07/13/2020     Review of Systems  Constitutional:  Negative for appetite change, fatigue, fever and unexpected weight change.  HENT:  Negative for tinnitus and trouble swallowing.   Eyes:  Negative for visual disturbance.  Respiratory:  Negative for cough, chest tightness and shortness of breath.   Cardiovascular:  Negative for chest pain, palpitations and leg swelling.  Gastrointestinal:  Negative for abdominal pain.  Endocrine: Negative for polydipsia and polyuria.  Genitourinary:  Negative for dysuria and hematuria.  Musculoskeletal:  Negative for arthralgias.  Neurological:  Negative for dizziness, tremors, weakness, numbness and headaches.  Psychiatric/Behavioral:  Negative for dysphoric mood.    Patient Active Problem List   Diagnosis Date Noted   Horseshoe kidney with renal calculus 07/25/2020   Aortic atherosclerosis ([place]) 07/25/2020   Hypokalemia    Type 2 diabetes mellitus without complication, without long-term current use of insulin ([place]) 12/28/2018   Essential hypertension 12/28/2018   Hyperlipidemia 12/28/2018    Allergies  Allergen Reactions   Norvasc [Amlodipine] Swelling    Past Surgical History:  Procedure Laterality Date   BUNIONECTOMY Bilateral 2005    Social History   Tobacco Use   Smoking status: Never   Smokeless tobacco: Never  Vaping Use   Vaping Use: Never used  Substance Use Topics   Alcohol use: Never   Drug use: Never     Medication list has been reviewed and updated.  Current Meds  Medication Sig   acetaminophen (TYLENOL) 500 MG tablet Take 1,000 mg by mouth every 6 (six) hours as needed.   aspirin EC 81 MG tablet Take 81 mg by mouth daily.   atorvastatin (LIPITOR) 10 MG tablet Take 1 tablet (10 mg total) by mouth daily.   blood glucose meter kit and supplies Dispense based on patient and insurance preference. Use up to four times daily as directed. (FOR ICD-10 E10.9,  E11.9).   CALCIUM PO Take 600 mg by mouth.   calcium-vitamin D (OSCAL WITH D) 500-200 MG-UNIT tablet Take 1 tablet by mouth.   Cholecalciferol (VITAMIN D3 PO) Take 1,000 Units by mouth.   cloNIDine (CATAPRES) 0.2 MG tablet Take 1 tablet (0.2 mg total) by mouth 2 (two) times daily.   doxazosin (CARDURA) 4 MG tablet Take 1 tablet (4 mg total) by mouth daily.   ferrous sulfate 324 MG TBEC Take 324 mg by mouth daily.   Magnesium 500 MG CAPS Take by mouth.   metFORMIN (GLUCOPHAGE) 500 MG tablet Take 1 tablet (500 mg total) by mouth 2 (two) times daily with a meal.   metoprolol succinate (TOPROL-XL) 100 MG 24 hr tablet Take 1 tablet (100 mg total) by mouth daily. Take with or immediately following a meal.   telmisartan (MICARDIS) 80 MG tablet Take 1 tablet (80 mg total) by mouth daily.   vitamin B-12 (CYANOCOBALAMIN) 500 MCG tablet Take 500 mcg by mouth daily.    PHQ 2/9 Scores 10/23/2020 07/25/2020 07/01/2020 12/06/2019  PHQ - 2 Score 0 0 0 0  PHQ- 9 Score 1 1 0 -    GAD 7 : Generalized Anxiety Score 10/23/2020 07/25/2020 07/01/2020 06/27/2019  Nervous, Anxious, on Edge 0 0 0 0  Control/stop worrying 0 0 0 0  Worry too much - different things 0 0 0 0  Trouble relaxing 0 0 0 0  Restless 0 0 0 0  Easily annoyed or irritable 0 0 0 0  Afraid - awful might happen 0 0 0 0  Total GAD 7 Score 0 0 0 0  Anxiety Difficulty Not difficult at all - - Not difficult at all    BP Readings from Last 3 Encounters:  10/23/20 (!) 142/84  07/25/20 (!) 148/88  07/17/20 (!) 161/85    Physical Exam Vitals and nursing note reviewed.  Constitutional:      General: She is not in acute distress.    Appearance: Normal appearance. She is well-developed.  HENT:     Head: Normocephalic and atraumatic.  Cardiovascular:     Rate and Rhythm: Normal rate and regular rhythm.     Pulses: Normal pulses.     Heart sounds: No murmur heard. Pulmonary:     Effort: Pulmonary effort is normal. No respiratory distress.      Breath sounds: No wheezing or rhonchi.  Musculoskeletal:     Cervical back: Normal range of motion.     Right lower leg: No edema.     Left lower leg: No edema.  Lymphadenopathy:     Cervical: No cervical adenopathy.  Skin:    General: Skin is warm and dry.     Capillary Refill: Capillary refill takes less than 2 seconds.     Findings: No rash.  Neurological:     General: No focal deficit present.     Mental Status: She is alert and oriented to person, place, and time.  Psychiatric:        Mood and Affect: Mood normal.  Behavior: Behavior normal.    Wt Readings from Last 3 Encounters:  10/23/20 145 lb 6.4 oz (66 kg)  07/25/20 153 lb (69.4 kg)  07/17/20 145 lb 9.6 oz (66 kg)    BP (!) 142/84 (BP Location: Right Arm, Patient Position: Sitting, Cuff Size: Normal)   Pulse 64   Ht '5\' 4"'  (1.626 m)   Wt 145 lb 6.4 oz (66 kg)   SpO2 97%   BMI 24.96 kg/m   Assessment and Plan: 1. Essential hypertension Clinically stable exam with controlled BP on four agents. Tolerating medications without side effects at this time. Pt to continue current regimen and low sodium diet; benefits of regular exercise as able discussed. - Basic metabolic panel  2. Type 2 diabetes mellitus without complication, without long-term current use of insulin (HCC) Clinically stable by exam and report without s/s of hypoglycemia. DM complicated by hypertension and dyslipidemia. Tolerating medications well without side effects or other concerns. - Hemoglobin A1c  3. Aortic atherosclerosis ([place]) On aspirin and statin   Partially dictated using Editor, commissioning. Any errors are unintentional.  Halina Maidens, MD [facility]  10/23/2020

## 2020-10-23 NOTE — Telephone Encounter (Signed)
Amber Padilla with pt's pharmacy is calling in to make provider aware that a PA is needed for pt's Rx for One Touch. The phone number for PA is [phone]      Pharmacy: [phone] option 2 Ref # [id number]

## 2020-10-24 LAB — BASIC METABOLIC PANEL
BUN/Creatinine Ratio: 16 (ref 12–28)
BUN: 15 mg/dL (ref 8–27)
CO2: 26 mmol/L (ref 20–29)
Calcium: 10 mg/dL (ref 8.7–10.3)
Chloride: 102 mmol/L (ref 96–106)
Creatinine, Ser: 0.92 mg/dL (ref 0.57–1.00)
Glucose: 93 mg/dL (ref 65–99)
Potassium: 4.5 mmol/L (ref 3.5–5.2)
Sodium: 142 mmol/L (ref 134–144)
eGFR: 62 mL/min/{1.73_m2} (ref >59)

## 2020-10-24 LAB — HEMOGLOBIN A1C
Est. average glucose Bld gHb Est-mCnc: 137 mg/dL
Hgb A1c MFr Bld: 6.4 % — ABNORMAL HIGH (ref 4.8–5.6)

## 2020-10-24 NOTE — Telephone Encounter (Signed)
Called and spoke with [facility]. Pharmacy said this has already been taking care of and its been shipped out to the patient as of yesterday.

## 2020-11-04 ENCOUNTER — Other Ambulatory Visit: Payer: Self-pay

## 2020-11-05 ENCOUNTER — Other Ambulatory Visit: Payer: Self-pay

## 2020-11-05 MED ORDER — ONETOUCH VERIO VI STRP: ORAL_STRIP | 3 refills | Status: DC

## 2020-11-23 ENCOUNTER — Other Ambulatory Visit: Payer: Self-pay | Admitting: Internal Medicine

## 2020-11-23 DIAGNOSIS — E782 Mixed hyperlipidemia: Secondary | ICD-10-CM

## 2020-11-23 DIAGNOSIS — I10 Essential (primary) hypertension: Secondary | ICD-10-CM

## 2020-11-23 NOTE — Telephone Encounter (Signed)
Requested Prescriptions  Pending Prescriptions Disp Refills  . doxazosin (CARDURA) 4 MG tablet [Pharmacy Med Name: DOXAZOSIN TAB 4MG ] 90 tablet     Sig: TAKE 1 TABLET DAILY     Cardiovascular:  Alpha Blockers Failed - 11/23/2020 11:15 AM      Failed - Last BP in normal range    BP Readings from Last 1 Encounters:  10/23/20 (!) 142/84         Passed - Valid encounter within last 6 months    Recent Outpatient Visits          1 month ago Essential hypertension   [facility] 10/25/20, MD   4 months ago Essential hypertension   [facility], MD   4 months ago Annual physical exam   [facility], MD   11 months ago Essential hypertension   [facility], MD   1 year ago Annual physical exam   [facility], MD      Future Appointments            In 3 months Reubin Milan Judithann Graves, MD [facility], [facility]   In 7 months [facility] Judithann Graves, MD [facility], [facility]           . atorvastatin (LIPITOR) 10 MG tablet [Pharmacy Med Name: ATORVASTATIN TAB 10MG ] 90 tablet 2    Sig: TAKE 1 TABLET DAILY     Cardiovascular:  Antilipid - Statins Failed - 11/23/2020 11:15 AM      Failed - Total Cholesterol in normal range and within 360 days    Cholesterol, Total  Date Value Ref Range Status  07/01/2020 222 (H) 100 - 199 mg/dL Final         Failed - LDL in normal range and within 360 days    LDL Chol Calc (NIH)  Date Value Ref Range Status  07/01/2020 144 (H) 0 - 99 mg/dL Final         Passed - HDL in normal range and within 360 days    HDL  Date Value Ref Range Status  07/01/2020 61 >39 mg/dL Final         Passed - Triglycerides in normal range and within 360 days    Triglycerides  Date Value Ref Range Status  07/01/2020 95 0 - 149 mg/dL Final         Passed - Patient is not pregnant      Passed - Valid encounter within last 12 months    Recent  Outpatient Visits          1 month ago Essential hypertension   [facility] 08/31/2020, MD   4 months ago Essential hypertension   [facility] Reubin Milan, MD   4 months ago Annual physical exam   [facility] Reubin Milan, MD   11 months ago Essential hypertension   [facility] Reubin Milan, MD   1 year ago Annual physical exam   [facility] Reubin Milan, MD      Future Appointments            In 3 months [facility] Reubin Milan, MD [facility], [facility]   In 7 months Nyoka Cowden, [facility], MD [facility], [facility]

## 2020-12-09 ENCOUNTER — Ambulatory Visit: Payer: Medicare HMO

## 2021-01-11 ENCOUNTER — Other Ambulatory Visit: Payer: Self-pay | Admitting: Internal Medicine

## 2021-01-11 DIAGNOSIS — E118 Type 2 diabetes mellitus with unspecified complications: Secondary | ICD-10-CM

## 2021-01-12 NOTE — Telephone Encounter (Signed)
Requested Prescriptions  Pending Prescriptions Disp Refills  . metFORMIN (GLUCOPHAGE) 500 MG tablet [Pharmacy Med Name: METFORMIN TAB 500MG] 180 tablet 1    Sig: TAKE 1 TABLET TWICE DAILY  WITH MEALS **ZYDUS MFR**     Endocrinology:  Diabetes - Biguanides Failed - 01/11/2021  8:22 PM      Failed - AA eGFR in normal range and within 360 days    GFR calc Af Amer  Date Value Ref Range Status  12/27/2019 62 >59 mL/min/1.73 Final    Comment:    **[facility] currently reports eGFR in compliance with the current**   recommendations of the [employer]. [facility] will   update reporting as new guidelines are published from the NKF-ASN   Task force.    GFR, Estimated  Date Value Ref Range Status  07/15/2020 60 (L) >60 mL/min Final    Comment:    (NOTE) Calculated using the CKD-EPI Creatinine Equation (2021)    eGFR  Date Value Ref Range Status  10/23/2020 62 >59 mL/min/1.73 Final         Passed - Cr in normal range and within 360 days    Creatinine, Ser  Date Value Ref Range Status  10/23/2020 0.92 0.57 - 1.00 mg/dL Final         Passed - HBA1C is between 0 and 7.9 and within 180 days    Hemoglobin A1C  Date Value Ref Range Status  03/10/2016 7.0  Final   Hgb A1c MFr Bld  Date Value Ref Range Status  10/23/2020 6.4 (H) 4.8 - 5.6 % Final    Comment:             Prediabetes: 5.7 - 6.4          Diabetes: >6.4          Glycemic control for adults with diabetes: <7.0          Passed - Valid encounter within last 6 months    Recent Outpatient Visits          2 months ago Essential hypertension   [facility] Glean Hess, MD   5 months ago Essential hypertension   [facility] Glean Hess, MD   6 months ago Annual physical exam   [facility] Glean Hess, MD   1 year ago Essential hypertension   [facility] Glean Hess, MD   1 year ago Annual physical exam   [facility] Glean Hess,  MD      Future Appointments            In 1 month Army Melia Jesse Sans, MD [facility], [place]   In 5 months Army Melia, Jesse Sans, MD [facility], [facility]

## 2021-01-29 ENCOUNTER — Other Ambulatory Visit: Payer: Self-pay

## 2021-01-29 DIAGNOSIS — I10 Essential (primary) hypertension: Secondary | ICD-10-CM

## 2021-01-29 MED ORDER — TELMISARTAN 80 MG PO TABS: 80.0000 mg | ORAL_TABLET | Freq: Every day | ORAL | 0 refills | Status: DC

## 2021-01-29 NOTE — Telephone Encounter (Signed)
Request for Telmisartan from [facility] came as a refill error and there is no way to link the patient or link the medication to reorder from the refill error.

## 2021-02-12 LAB — HM DIABETES EYE EXAM

## 2021-02-13 ENCOUNTER — Other Ambulatory Visit: Payer: Self-pay | Admitting: Internal Medicine

## 2021-02-13 DIAGNOSIS — I10 Essential (primary) hypertension: Secondary | ICD-10-CM

## 2021-02-14 NOTE — Telephone Encounter (Signed)
Requested Prescriptions  Pending Prescriptions Disp Refills  . metoprolol succinate (TOPROL-XL) 100 MG 24 hr tablet [Pharmacy Med Name: METOPROL SUC TAB 100MG  ER] 90 tablet 0    Sig: TAKE 1 TABLET DAILY WITH ORIMMEDIATELY FOLLOWING A    MEAL     Cardiovascular:  Beta Blockers Failed - 02/13/2021  8:16 PM      Failed - Last BP in normal range    BP Readings from Last 1 Encounters:  10/23/20 (!) 142/84         Passed - Last Heart Rate in normal range    Pulse Readings from Last 1 Encounters:  10/23/20 64         Passed - Valid encounter within last 6 months    Recent Outpatient Visits          3 months ago Essential hypertension   [facility] 10/25/20, MD   6 months ago Essential hypertension   [facility], MD   7 months ago Annual physical exam   [facility], MD   1 year ago Essential hypertension   [facility], MD   1 year ago Annual physical exam   [facility], MD      Future Appointments            In 1 week Reubin Milan Judithann Graves, MD [facility], [facility]   In 4 months [facility], Judithann Graves, MD [facility], [facility]

## 2021-02-25 ENCOUNTER — Encounter: Payer: Self-pay | Admitting: Internal Medicine

## 2021-02-25 ENCOUNTER — Ambulatory Visit (INDEPENDENT_AMBULATORY_CARE_PROVIDER_SITE_OTHER): Payer: Medicare HMO | Admitting: Internal Medicine

## 2021-02-25 ENCOUNTER — Other Ambulatory Visit: Payer: Self-pay

## 2021-02-25 VITALS — BP 112/66 | HR 57 | Ht 64.0 in | Wt 140.4 lb

## 2021-02-25 DIAGNOSIS — E119 Type 2 diabetes mellitus without complications: Secondary | ICD-10-CM | POA: Diagnosis not present

## 2021-02-25 DIAGNOSIS — I10 Essential (primary) hypertension: Secondary | ICD-10-CM | POA: Diagnosis not present

## 2021-02-25 DIAGNOSIS — Z23 Encounter for immunization: Secondary | ICD-10-CM | POA: Diagnosis not present

## 2021-02-25 LAB — POCT GLYCOSYLATED HEMOGLOBIN (HGB A1C): Hemoglobin A1C: 6.1 % — AB (ref 4.0–5.6)

## 2021-02-25 MED ORDER — METOPROLOL SUCCINATE ER 100 MG PO TB24: ORAL_TABLET | ORAL | 1 refills | Status: DC

## 2021-02-25 MED ORDER — DOXAZOSIN MESYLATE 4 MG PO TABS
4.0000 mg | ORAL_TABLET | Freq: Every day | ORAL | 1 refills | Status: DC
Start: 2021-02-25 — End: 2021-07-11

## 2021-02-25 MED ORDER — SHINGRIX 50 MCG/0.5ML IM SUSR: 0.5000 mL | Freq: Once | INTRAMUSCULAR | 1 refills | Status: AC

## 2021-02-25 MED ORDER — TELMISARTAN 80 MG PO TABS: 80.0000 mg | ORAL_TABLET | Freq: Every day | ORAL | 1 refills | Status: DC

## 2021-02-25 NOTE — Progress Notes (Signed)
Date:  02/25/2021   Name:  Amber Padilla   DOB:  October 16, 1935   MRN:  [id number]   Chief Complaint: Diabetes and Hypertension  Diabetes She presents for her follow-up diabetic visit. She has type 2 diabetes mellitus. Her disease course has been stable. Pertinent negatives for hypoglycemia include no headaches or tremors. Pertinent negatives for diabetes include no chest pain, no fatigue, no polydipsia and no polyuria. Current diabetic treatment includes oral agent (monotherapy). She is compliant with treatment all of the time. Her breakfast blood glucose is taken between 6-7 am. Her breakfast blood glucose range is generally 110-130 mg/dl.  Hypertension This is a chronic problem. The problem is controlled. Pertinent negatives include no chest pain, headaches, palpitations or shortness of breath. Past treatments include beta blockers, angiotensin blockers and direct vasodilators. The current treatment provides significant improvement. There are no compliance problems.    Lab Results  Component Value Date   NA 142 10/23/2020   K 4.5 10/23/2020   CO2 26 10/23/2020   GLUCOSE 93 10/23/2020   BUN 15 10/23/2020   CREATININE 0.92 10/23/2020   CALCIUM 10.0 10/23/2020   EGFR 62 10/23/2020   GFRNONAA 60 (L) 07/15/2020   Lab Results  Component Value Date   CHOL 222 (H) 07/01/2020   HDL 61 07/01/2020   LDLCALC 144 (H) 07/01/2020   TRIG 95 07/01/2020   CHOLHDL 3.6 07/01/2020   Lab Results  Component Value Date   TSH 0.986 07/14/2020   Lab Results  Component Value Date   HGBA1C 6.1 (A) 02/25/2021   Lab Results  Component Value Date   WBC 6.7 07/25/2020   HGB 10.4 (L) 07/25/2020   HCT 32.1 (L) 07/25/2020   MCV 92 07/25/2020   PLT 222 07/25/2020   Lab Results  Component Value Date   ALT 13 07/13/2020   AST 25 07/13/2020   ALKPHOS 68 07/13/2020   BILITOT 0.9 07/13/2020   No results found for: 25OHVITD2, 25OHVITD3, VD25OH   Review of Systems  Constitutional:  Negative for  appetite change, fatigue, fever and unexpected weight change.  HENT:  Negative for tinnitus and trouble swallowing.   Eyes:  Negative for visual disturbance.  Respiratory:  Negative for cough, chest tightness and shortness of breath.   Cardiovascular:  Negative for chest pain, palpitations and leg swelling.  Gastrointestinal:  Negative for abdominal pain.  Endocrine: Negative for polydipsia and polyuria.  Genitourinary:  Negative for dysuria and hematuria.  Musculoskeletal:  Negative for arthralgias.  Neurological:  Negative for tremors, numbness and headaches.  Psychiatric/Behavioral:  Negative for dysphoric mood.    Patient Active Problem List   Diagnosis Date Noted   Horseshoe kidney with renal calculus 07/25/2020   Aortic atherosclerosis ([place]) 07/25/2020   Hypokalemia    Type 2 diabetes mellitus without complication, without long-term current use of insulin ([place]) 12/28/2018   Essential hypertension 12/28/2018   Hyperlipidemia 12/28/2018    Allergies  Allergen Reactions   Norvasc [Amlodipine] Swelling    Past Surgical History:  Procedure Laterality Date   BUNIONECTOMY Bilateral 2005    Social History   Tobacco Use   Smoking status: Never   Smokeless tobacco: Never  Vaping Use   Vaping Use: Never used  Substance Use Topics   Alcohol use: Never   Drug use: Never     Medication list has been reviewed and updated.  Current Meds  Medication Sig   acetaminophen (TYLENOL) 500 MG tablet Take 1,000 mg by mouth every 6 (six)  hours as needed.   aspirin EC 81 MG tablet Take 81 mg by mouth daily.   atorvastatin (LIPITOR) 10 MG tablet TAKE 1 TABLET DAILY   blood glucose meter kit and supplies Dispense based on patient and insurance preference. Use up to four times daily as directed. (FOR ICD-10 E10.9, E11.9).   CALCIUM PO Take 600 mg by mouth.   calcium-vitamin D (OSCAL WITH D) 500-200 MG-UNIT tablet Take 1 tablet by mouth.   Cholecalciferol (VITAMIN D3 PO) Take 1,000  Units by mouth.   cloNIDine (CATAPRES) 0.2 MG tablet Take 1 tablet (0.2 mg total) by mouth 2 (two) times daily.   ferrous sulfate 324 MG TBEC Take 324 mg by mouth daily.   Magnesium 500 MG CAPS Take by mouth.   metFORMIN (GLUCOPHAGE) 500 MG tablet TAKE 1 TABLET TWICE DAILY  WITH MEALS **ZYDUS MFR**   ONETOUCH VERIO test strip Use up to 4 times daily as directed E10.9, E11.9   vitamin B-12 (CYANOCOBALAMIN) 500 MCG tablet Take 500 mcg by mouth daily.   Zoster Vaccine Adjuvanted [facility]) injection Inject 0.5 mLs into the muscle once for 1 dose.   [DISCONTINUED] doxazosin (CARDURA) 4 MG tablet Take 1 tablet (4 mg total) by mouth daily.   [DISCONTINUED] metoprolol succinate (TOPROL-XL) 100 MG 24 hr tablet TAKE 1 TABLET DAILY WITH ORIMMEDIATELY FOLLOWING A    MEAL   [DISCONTINUED] telmisartan (MICARDIS) 80 MG tablet Take 1 tablet (80 mg total) by mouth daily.    PHQ 2/9 Scores 02/25/2021 10/23/2020 07/25/2020 07/01/2020  PHQ - 2 Score 0 0 0 0  PHQ- 9 Score '2 1 1 ' 0    GAD 7 : Generalized Anxiety Score 02/25/2021 10/23/2020 07/25/2020 07/01/2020  Nervous, Anxious, on Edge 0 0 0 0  Control/stop worrying 0 0 0 0  Worry too much - different things 0 0 0 0  Trouble relaxing 0 0 0 0  Restless 0 0 0 0  Easily annoyed or irritable 0 0 0 0  Afraid - awful might happen 0 0 0 0  Total GAD 7 Score 0 0 0 0  Anxiety Difficulty Not difficult at all Not difficult at all - -    BP Readings from Last 3 Encounters:  02/25/21 112/66  10/23/20 (!) 142/84  07/25/20 (!) 148/88    Physical Exam Vitals and nursing note reviewed.  Constitutional:      General: She is not in acute distress.    Appearance: She is well-developed.  HENT:     Head: Normocephalic and atraumatic.  Neck:     Vascular: No carotid bruit.  Cardiovascular:     Rate and Rhythm: Normal rate and regular rhythm.     Pulses: Normal pulses.     Heart sounds: No murmur heard. Pulmonary:     Effort: Pulmonary effort is normal. No respiratory  distress.     Breath sounds: No wheezing or rhonchi.  Musculoskeletal:     Cervical back: Normal range of motion.     Right lower leg: No edema.     Left lower leg: No edema.  Lymphadenopathy:     Cervical: No cervical adenopathy.  Skin:    General: Skin is warm and dry.     Findings: No rash.  Neurological:     General: No focal deficit present.     Mental Status: She is alert and oriented to person, place, and time.  Psychiatric:        Mood and Affect: Mood normal.  Behavior: Behavior normal.    Wt Readings from Last 3 Encounters:  02/25/21 140 lb 6.4 oz (63.7 kg)  10/23/20 145 lb 6.4 oz (66 kg)  07/25/20 153 lb (69.4 kg)    BP 112/66   Pulse (!) 57   Ht '5\' 4"'  (1.626 m)   Wt 140 lb 6.4 oz (63.7 kg)   SpO2 97%   BMI 24.10 kg/m   Assessment and Plan: 1. Essential hypertension Clinically stable exam with well controlled BP. Tolerating medications without side effects at this time. Pt to continue current regimen and low sodium diet; benefits of regular exercise as able discussed. - doxazosin (CARDURA) 4 MG tablet; Take 1 tablet (4 mg total) by mouth daily.  Dispense: 90 tablet; Refill: 1 - telmisartan (MICARDIS) 80 MG tablet; Take 1 tablet (80 mg total) by mouth daily.  Dispense: 90 tablet; Refill: 1 - metoprolol succinate (TOPROL-XL) 100 MG 24 hr tablet; TAKE 1 TABLET DAILY WITH ORIMMEDIATELY FOLLOWING A    MEAL  Dispense: 90 tablet; Refill: 1  2. Type 2 diabetes mellitus without complication, without long-term current use of insulin (HCC) Clinically stable by exam and report without s/s of hypoglycemia. DM complicated by hypertension and dyslipidemia. Tolerating medications well without side effects or other concerns. - POCT glycosylated hemoglobin (Hb A1C)= 6.1 down from 6.4  3. Need for shingles vaccine To get at local pharmacy - Zoster Vaccine Adjuvanted [facility]) injection; Inject 0.5 mLs into the muscle once for 1 dose.  Dispense: 0.5 mL; Refill:  1   Partially dictated using Editor, commissioning. Any errors are unintentional.  Halina Maidens, MD [facility]  02/25/2021

## 2021-02-28 ENCOUNTER — Encounter: Payer: Self-pay | Admitting: Internal Medicine

## 2021-04-17 ENCOUNTER — Telehealth: Payer: Self-pay | Admitting: Internal Medicine

## 2021-04-17 ENCOUNTER — Telehealth: Payer: Self-pay

## 2021-04-17 NOTE — Telephone Encounter (Signed)
Spoke to pt she stated that at night she uses the bathroom on her self and its a lot of urine. Dr. Judithann Graves has not seen the patient for urinary problems before. Pt had an appt in 06/2021 for a CPE. I tired to schedule an office visit with Dr. Judithann Graves because pt needed to be seen for symptoms before a referral can be placed. Pt refused and stated "I have an appt in April I will talk to Dr. Judithann Graves about it then". Explain to patient so she is aware that talking about anything other than a CPE could result in a extra bill. Pt stated " I know what Im doing I will just talk to Dr. Judithann Graves about it at my physical". Pt is aware that she could receive a extra bill due to talking about another problem that's outside of her CPE.  KP

## 2021-04-17 NOTE — Telephone Encounter (Signed)
Pt states she would like you to order the [facility] for her.   [employer] use. She called Monia Pouch and they will cover. Please call this company to order. [employer] [phone]  [employer] will ship directly to her home.

## 2021-04-22 NOTE — Telephone Encounter (Signed)
error 

## 2021-06-07 ENCOUNTER — Other Ambulatory Visit: Payer: Self-pay | Admitting: Internal Medicine

## 2021-06-07 DIAGNOSIS — E782 Mixed hyperlipidemia: Secondary | ICD-10-CM

## 2021-06-09 NOTE — Telephone Encounter (Signed)
Refilled 11/23/2020 #90 2 refill - should have enough to last until 08/23/2021. ?Requested Prescriptions  ?Pending Prescriptions Disp Refills  ?? atorvastatin (LIPITOR) 10 MG tablet [Pharmacy Med Name: ATORVASTATIN TAB 10MG ] 90 tablet 2  ?  Sig: TAKE 1 TABLET DAILY  ?  ? Cardiovascular:  Antilipid - Statins Failed - 06/07/2021  1:53 PM  ?  ?  Failed - Lipid Panel in normal range within the last 12 months  ?  Cholesterol, Total  ?Date Value Ref Range Status  ?07/01/2020 222 (H) 100 - 199 mg/dL Final  ? ?LDL Chol Calc (NIH)  ?Date Value Ref Range Status  ?07/01/2020 144 (H) 0 - 99 mg/dL Final  ? ?HDL  ?Date Value Ref Range Status  ?07/01/2020 61 >39 mg/dL Final  ? ?Triglycerides  ?Date Value Ref Range Status  ?07/01/2020 95 0 - 149 mg/dL Final  ? ?  ?  ?  Passed - Patient is not pregnant  ?  ?  Passed - Valid encounter within last 12 months  ?  Recent Outpatient Visits   ?      ? 3 months ago Essential hypertension  ? [facility] Glean Hess, MD  ? 7 months ago Essential hypertension  ? [facility] Glean Hess, MD  ? 10 months ago Essential hypertension  ? [facility] Glean Hess, MD  ? 11 months ago Annual physical exam  ? [facility] Glean Hess, MD  ? 1 year ago Essential hypertension  ? [facility] Glean Hess, MD  ?  ?  ?Future Appointments   ?        ? In 3 weeks Glean Hess, MD [facility] [place]  ?  ? ?  ?  ?  ? ?

## 2021-07-04 ENCOUNTER — Encounter: Payer: Medicare HMO | Admitting: Internal Medicine

## 2021-07-04 DIAGNOSIS — M8580 Other specified disorders of bone density and structure, unspecified site: Secondary | ICD-10-CM | POA: Insufficient documentation

## 2021-07-04 NOTE — Progress Notes (Deleted)
? ? ?Date:  07/04/2021  ? ?Name:  Amber Padilla   DOB:  06/19/1935   MRN:  [id number] ? ? ?Chief Complaint: No chief complaint on file. ?Amber Padilla is a 86 y.o. female who presents today for her Complete Annual Exam. She feels {DESC; WELL/FAIRLY WELL/POORLY:18703}. She reports exercising ***. She reports she is sleeping {DESC; WELL/FAIRLY WELL/POORLY:18703}. Breast complaints ***. ? ?Mammogram: 2019 ?DEXA: 2017 Normal spine, osteopenia hip ?Colonoscopy: aged out ? ?Health Maintenance Due  ?Topic Date Due  ? Zoster Vaccines- Shingrix (1 of 2) Never done  ? COVID-19 Vaccine (4 - Booster for [place] series) 02/28/2020  ? FOOT EXAM  07/01/2021  ?  ?Immunization History  ?Administered Date(s) Administered  ? Fluad Quad(high Dose 65+) 12/28/2018, 12/27/2019, 02/25/2021  ? PFIZER(Purple Top)SARS-COV-2 Vaccination 05/25/2019, 06/15/2019, 01/03/2020  ? Pneumococcal Conjugate-13 01/23/2014  ? Pneumococcal Polysaccharide-23 12/27/2012  ? ? ?Hypertension ?This is a chronic problem. The problem is controlled. Pertinent negatives include no chest pain, headaches, palpitations or shortness of breath. Past treatments include angiotensin blockers, beta blockers, alpha 1 blockers and central alpha agonists. Hypertensive end-organ damage includes kidney disease, CAD/MI and CVA.  ?Diabetes ?She presents for her follow-up diabetic visit. She has type 2 diabetes mellitus. Her disease course has been stable. Pertinent negatives for hypoglycemia include no dizziness, headaches, nervousness/anxiousness or tremors. Pertinent negatives for diabetes include no chest pain, no fatigue, no polydipsia and no polyuria. Diabetic complications include a CVA. Current diabetic treatment includes oral agent (monotherapy) (metformin). An ACE inhibitor/angiotensin II receptor blocker is being taken. Eye exam is current.  ?Hyperlipidemia ?This is a chronic problem. The problem is resistant. Pertinent negatives include no chest pain or shortness of breath.  Current antihyperlipidemic treatment includes statins.  ? ?Lab Results  ?Component Value Date  ? NA 142 10/23/2020  ? K 4.5 10/23/2020  ? CO2 26 10/23/2020  ? GLUCOSE 93 10/23/2020  ? BUN 15 10/23/2020  ? CREATININE 0.92 10/23/2020  ? CALCIUM 10.0 10/23/2020  ? EGFR 62 10/23/2020  ? GFRNONAA 60 (L) 07/15/2020  ? ?Lab Results  ?Component Value Date  ? CHOL 222 (H) 07/01/2020  ? HDL 61 07/01/2020  ? LDLCALC 144 (H) 07/01/2020  ? TRIG 95 07/01/2020  ? CHOLHDL 3.6 07/01/2020  ? ?Lab Results  ?Component Value Date  ? TSH 0.986 07/14/2020  ? ?Lab Results  ?Component Value Date  ? HGBA1C 6.1 (A) 02/25/2021  ? ?Lab Results  ?Component Value Date  ? WBC 6.7 07/25/2020  ? HGB 10.4 (L) 07/25/2020  ? HCT 32.1 (L) 07/25/2020  ? MCV 92 07/25/2020  ? PLT 222 07/25/2020  ? ?Lab Results  ?Component Value Date  ? ALT 13 07/13/2020  ? AST 25 07/13/2020  ? ALKPHOS 68 07/13/2020  ? BILITOT 0.9 07/13/2020  ? ?No results found for: 25OHVITD2, [place], VD25OH  ? ?Review of Systems  ?Constitutional:  Negative for chills, fatigue and fever.  ?HENT:  Negative for congestion, hearing loss, tinnitus, trouble swallowing and voice change.   ?Eyes:  Negative for visual disturbance.  ?Respiratory:  Negative for cough, chest tightness, shortness of breath and wheezing.   ?Cardiovascular:  Negative for chest pain, palpitations and leg swelling.  ?Gastrointestinal:  Negative for abdominal pain, constipation, diarrhea and vomiting.  ?Endocrine: Negative for polydipsia and polyuria.  ?Genitourinary:  Negative for dysuria, frequency, genital sores, vaginal bleeding and vaginal discharge.  ?Musculoskeletal:  Negative for arthralgias, gait problem and joint swelling.  ?Skin:  Negative for color change and rash.  ?Neurological:  Negative for dizziness, tremors, light-headedness and headaches.  ?Hematological:  Negative for adenopathy. Does not bruise/bleed easily.  ?Psychiatric/Behavioral:  Negative for dysphoric mood and sleep disturbance. The patient is  not nervous/anxious.   ? ?Patient Active Problem List  ? Diagnosis Date Noted  ? Horseshoe kidney with renal calculus 07/25/2020  ? Aortic atherosclerosis ([place]) 07/25/2020  ? Hypokalemia   ? Type 2 diabetes mellitus without complication, without long-term current use of insulin ([place]) 12/28/2018  ? Essential hypertension 12/28/2018  ? Hyperlipidemia 12/28/2018  ? ? ?Allergies  ?Allergen Reactions  ? Norvasc [Amlodipine] Swelling  ? ? ?Past Surgical History:  ?Procedure Laterality Date  ? BUNIONECTOMY Bilateral 2005  ? ? ?Social History  ? ?Tobacco Use  ? Smoking status: Never  ? Smokeless tobacco: Never  ?Vaping Use  ? Vaping Use: Never used  ?Substance Use Topics  ? Alcohol use: Never  ? Drug use: Never  ? ? ? ?Medication list has been reviewed and updated. ? ?No outpatient medications have been marked as taking for the 07/04/21 encounter (Appointment) with Glean Hess, MD.  ? ? ? ?  02/25/2021  ?  9:46 AM 10/23/2020  ? 10:16 AM 07/25/2020  ? 11:02 AM 07/01/2020  ? 10:46 AM  ?GAD 7 : Generalized Anxiety Score  ?Nervous, Anxious, on Edge 0 0 0 0  ?Control/stop worrying 0 0 0 0  ?Worry too much - different things 0 0 0 0  ?Trouble relaxing 0 0 0 0  ?Restless 0 0 0 0  ?Easily annoyed or irritable 0 0 0 0  ?Afraid - awful might happen 0 0 0 0  ?Total GAD 7 Score 0 0 0 0  ?Anxiety Difficulty Not difficult at all Not difficult at all    ? ? ? ?  02/25/2021  ?  9:46 AM  ?Depression screen PHQ 2/9  ?Decreased Interest 0  ?Down, Depressed, Hopeless 0  ?PHQ - 2 Score 0  ?Altered sleeping 0  ?Tired, decreased energy 2  ?Change in appetite 0  ?Feeling bad or failure about yourself  0  ?Trouble concentrating 0  ?Moving slowly or fidgety/restless 0  ?Suicidal thoughts 0  ?PHQ-9 Score 2  ?Difficult doing work/chores Not difficult at all  ? ? ?BP Readings from Last 3 Encounters:  ?02/25/21 112/66  ?10/23/20 (!) 142/84  ?07/25/20 (!) 148/88  ? ? ?Physical Exam ?Vitals and nursing note reviewed.  ?Constitutional:   ?   General: She  is not in acute distress. ?   Appearance: She is well-developed.  ?HENT:  ?   Head: Normocephalic and atraumatic.  ?   Right Ear: Tympanic membrane and ear canal normal.  ?   Left Ear: Tympanic membrane and ear canal normal.  ?   Nose:  ?   Right Sinus: No maxillary sinus tenderness.  ?   Left Sinus: No maxillary sinus tenderness.  ?Eyes:  ?   General: No scleral icterus.    ?   Right eye: No discharge.     ?   Left eye: No discharge.  ?   Conjunctiva/sclera: Conjunctivae normal.  ?Neck:  ?   Thyroid: No thyromegaly.  ?   Vascular: No carotid bruit.  ?Cardiovascular:  ?   Rate and Rhythm: Normal rate and regular rhythm.  ?   Pulses: Normal pulses.  ?   Heart sounds: Normal heart sounds.  ?Pulmonary:  ?   Effort: Pulmonary effort is normal. No respiratory distress.  ?   Breath sounds: No  wheezing.  ?Chest:  ?Breasts: ?   Right: No mass, nipple discharge, skin change or tenderness.  ?   Left: No mass, nipple discharge, skin change or tenderness.  ?Abdominal:  ?   General: Bowel sounds are normal.  ?   Palpations: Abdomen is soft.  ?   Tenderness: There is no abdominal tenderness.  ?Musculoskeletal:  ?   Cervical back: Normal range of motion. No erythema.  ?   Right lower leg: No edema.  ?   Left lower leg: No edema.  ?Lymphadenopathy:  ?   Cervical: No cervical adenopathy.  ?Skin: ?   General: Skin is warm and dry.  ?   Findings: No rash.  ?Neurological:  ?   Mental Status: She is alert and oriented to person, place, and time.  ?   Cranial Nerves: No cranial nerve deficit.  ?   Sensory: No sensory deficit.  ?   Deep Tendon Reflexes: Reflexes are normal and symmetric.  ?Psychiatric:     ?   Attention and Perception: Attention normal.     ?   Mood and Affect: Mood normal.  ? ? ?Wt Readings from Last 3 Encounters:  ?02/25/21 140 lb 6.4 oz (63.7 kg)  ?10/23/20 145 lb 6.4 oz (66 kg)  ?07/25/20 153 lb (69.4 kg)  ? ? ?There were no vitals taken for this visit. ? ?Assessment and Plan: ? ? ? ? ?

## 2021-07-11 ENCOUNTER — Encounter: Payer: Self-pay | Admitting: Internal Medicine

## 2021-07-11 ENCOUNTER — Telehealth: Payer: Self-pay | Admitting: Internal Medicine

## 2021-07-11 ENCOUNTER — Ambulatory Visit (INDEPENDENT_AMBULATORY_CARE_PROVIDER_SITE_OTHER): Payer: Medicare HMO | Admitting: Internal Medicine

## 2021-07-11 VITALS — BP 144/94 | HR 60 | Ht 64.0 in | Wt 146.2 lb

## 2021-07-11 DIAGNOSIS — E118 Type 2 diabetes mellitus with unspecified complications: Secondary | ICD-10-CM | POA: Diagnosis not present

## 2021-07-11 DIAGNOSIS — E1169 Type 2 diabetes mellitus with other specified complication: Secondary | ICD-10-CM | POA: Diagnosis not present

## 2021-07-11 DIAGNOSIS — I10 Essential (primary) hypertension: Secondary | ICD-10-CM | POA: Diagnosis not present

## 2021-07-11 DIAGNOSIS — I700 Atherosclerosis of aorta: Secondary | ICD-10-CM | POA: Diagnosis not present

## 2021-07-11 DIAGNOSIS — E785 Hyperlipidemia, unspecified: Secondary | ICD-10-CM

## 2021-07-11 DIAGNOSIS — J0100 Acute maxillary sinusitis, unspecified: Secondary | ICD-10-CM

## 2021-07-11 DIAGNOSIS — E782 Mixed hyperlipidemia: Secondary | ICD-10-CM

## 2021-07-11 DIAGNOSIS — Z0000 Encounter for general adult medical examination without abnormal findings: Secondary | ICD-10-CM

## 2021-07-11 LAB — POCT URINALYSIS DIPSTICK
Bilirubin, UA: NEGATIVE
Blood, UA: NEGATIVE
Glucose, UA: NEGATIVE
Ketones, UA: NEGATIVE
Leukocytes, UA: NEGATIVE
Nitrite, UA: NEGATIVE
Protein, UA: NEGATIVE
Spec Grav, UA: 1.015 (ref 1.010–1.025)
Urobilinogen, UA: 0.2 E.U./dL
pH, UA: 6 (ref 5.0–8.0)

## 2021-07-11 MED ORDER — TELMISARTAN 80 MG PO TABS: 80.0000 mg | ORAL_TABLET | Freq: Every day | ORAL | 1 refills | Status: DC

## 2021-07-11 MED ORDER — AZITHROMYCIN 250 MG PO TABS: ORAL_TABLET | ORAL | 0 refills | Status: AC

## 2021-07-11 MED ORDER — METOPROLOL SUCCINATE ER 100 MG PO TB24: ORAL_TABLET | ORAL | 1 refills | Status: DC

## 2021-07-11 MED ORDER — DOXAZOSIN MESYLATE 4 MG PO TABS: 4.0000 mg | ORAL_TABLET | Freq: Every day | ORAL | 1 refills | Status: DC

## 2021-07-11 MED ORDER — METFORMIN HCL 500 MG PO TABS: ORAL_TABLET | ORAL | 1 refills | Status: DC

## 2021-07-11 MED ORDER — LANCETS MISC: 1.0000 | Freq: Every day | 3 refills | Status: DC

## 2021-07-11 MED ORDER — ATORVASTATIN CALCIUM 10 MG PO TABS: 10.0000 mg | ORAL_TABLET | Freq: Every day | ORAL | 1 refills | Status: DC

## 2021-07-11 MED ORDER — CLONIDINE HCL 0.2 MG PO TABS: 0.2000 mg | ORAL_TABLET | Freq: Two times a day (BID) | ORAL | 1 refills | Status: DC

## 2021-07-11 NOTE — Progress Notes (Signed)
? ? ?Date:  07/11/2021  ? ?Name:  Amber Padilla   DOB:  [birth year]   MRN:  [id number] ? ? ?Chief Complaint: Annual Exam ?Amber Padilla is a 86 y.o. female who presents today for her Complete Annual Exam. She feels fairly well. She reports exercising. She reports she is sleeping well. Breast complaints - none. ? ?Mammogram: 2019 ?DEXA: 2017  Normal ?Colonoscopy: aged out ? ?There are no preventive care reminders to display for this patient. ?  ?Immunization History  ?Administered Date(s) Administered  ? Fluad Quad(high Dose 65+) 12/28/2018, 12/27/2019, 02/25/2021  ? PFIZER(Purple Top)SARS-COV-2 Vaccination 05/25/2019, 06/15/2019, 01/03/2020  ? Pneumococcal Conjugate-13 01/23/2014  ? Pneumococcal Polysaccharide-23 12/27/2012  ? Zoster Recombinat (Shingrix) 03/17/2021  ? ? ?Hypertension ?This is a chronic problem. The problem is controlled. Associated symptoms include headaches. Pertinent negatives include no chest pain, palpitations or shortness of breath. Past treatments include angiotensin blockers, beta blockers, central alpha agonists and alpha 1 blockers. The current treatment provides significant improvement. There is no history of CVA.  ?Diabetes ?She presents for her follow-up diabetic visit. She has type 2 diabetes mellitus. Hypoglycemia symptoms include headaches. Pertinent negatives for hypoglycemia include no dizziness, nervousness/anxiousness or tremors. Pertinent negatives for diabetes include no chest pain, no fatigue, no polydipsia and no polyuria. Pertinent negatives for diabetic complications include no CVA or heart disease. Current diabetic treatment includes oral agent (monotherapy). She is compliant with treatment all of the time. Her breakfast blood glucose is taken between 6-7 am. Her breakfast blood glucose range is generally 90-110 mg/dl. An ACE inhibitor/angiotensin II receptor blocker is being taken. Eye exam is current.  ?Hyperlipidemia ?This is a chronic problem. The problem is controlled.  Pertinent negatives include no chest pain or shortness of breath. Current antihyperlipidemic treatment includes statins.  ?Sinus Problem ?This is a new problem. The current episode started 1 to 4 weeks ago. The problem is unchanged. There has been no fever. Associated symptoms include ear pain (Left), headaches, sinus pressure, sneezing and a sore throat. Pertinent negatives include no chills, congestion, coughing or shortness of breath.  ? ?Lab Results  ?Component Value Date  ? NA 142 10/23/2020  ? K 4.5 10/23/2020  ? CO2 26 10/23/2020  ? GLUCOSE 93 10/23/2020  ? BUN 15 10/23/2020  ? CREATININE 0.92 10/23/2020  ? CALCIUM 10.0 10/23/2020  ? EGFR 62 10/23/2020  ? GFRNONAA 60 (L) 07/15/2020  ? ?Lab Results  ?Component Value Date  ? CHOL 222 (H) 07/01/2020  ? HDL 61 07/01/2020  ? LDLCALC 144 (H) 07/01/2020  ? TRIG 95 07/01/2020  ? CHOLHDL 3.6 07/01/2020  ? ?Lab Results  ?Component Value Date  ? TSH 0.986 07/14/2020  ? ?Lab Results  ?Component Value Date  ? HGBA1C 6.1 (A) 02/25/2021  ? ?Lab Results  ?Component Value Date  ? WBC 6.7 07/25/2020  ? HGB 10.4 (L) 07/25/2020  ? HCT 32.1 (L) 07/25/2020  ? MCV 92 07/25/2020  ? PLT 222 07/25/2020  ? ?Lab Results  ?Component Value Date  ? ALT 13 07/13/2020  ? AST 25 07/13/2020  ? ALKPHOS 68 07/13/2020  ? BILITOT 0.9 07/13/2020  ? ?No results found for: 25OHVITD2, [place], VD25OH  ? ?Review of Systems  ?Constitutional:  Negative for chills, fatigue and fever.  ?HENT:  Positive for ear pain (Left), sinus pressure, sneezing and sore throat. Negative for congestion, hearing loss, tinnitus, trouble swallowing and voice change.   ?Eyes:  Negative for visual disturbance.  ?Respiratory:  Negative for cough, chest tightness, shortness  of breath and wheezing.   ?Cardiovascular:  Negative for chest pain, palpitations and leg swelling.  ?Gastrointestinal:  Negative for abdominal pain, constipation, diarrhea and vomiting.  ?Endocrine: Negative for polydipsia and polyuria.  ?Genitourinary:   Negative for dysuria, frequency, genital sores, vaginal bleeding and vaginal discharge.  ?     Nocturia 2-3 times at night  ?Musculoskeletal:  Negative for arthralgias, gait problem and joint swelling.  ?Skin:  Negative for color change and rash.  ?Neurological:  Positive for headaches. Negative for dizziness, tremors and light-headedness.  ?Hematological:  Negative for adenopathy. Does not bruise/bleed easily.  ?Psychiatric/Behavioral:  Negative for dysphoric mood and sleep disturbance. The patient is not nervous/anxious.   ? ?Patient Active Problem List  ? Diagnosis Date Noted  ? Osteopenia determined by x-ray 07/04/2021  ? Horseshoe kidney with renal calculus 07/25/2020  ? Aortic atherosclerosis ([place]) 07/25/2020  ? Hypokalemia   ? Type 2 diabetes mellitus without complication, without long-term current use of insulin ([place]) 12/28/2018  ? Essential hypertension 12/28/2018  ? Hyperlipidemia associated with type 2 diabetes mellitus ([place]) 12/28/2018  ? ? ?Allergies  ?Allergen Reactions  ? Norvasc [Amlodipine] Swelling  ? ? ?Past Surgical History:  ?Procedure Laterality Date  ? BUNIONECTOMY Bilateral 2005  ? ? ?Social History  ? ?Tobacco Use  ? Smoking status: Never  ? Smokeless tobacco: Never  ?Vaping Use  ? Vaping Use: Never used  ?Substance Use Topics  ? Alcohol use: Never  ? Drug use: Never  ? ? ? ?Medication list has been reviewed and updated. ? ?Current Meds  ?Medication Sig  ? acetaminophen (TYLENOL) 500 MG tablet Take 1,000 mg by mouth every 6 (six) hours as needed.  ? aspirin EC 81 MG tablet Take 81 mg by mouth daily.  ? azithromycin (ZITHROMAX Z-PAK) 250 MG tablet UAD  ? blood glucose meter kit and supplies Dispense based on patient and insurance preference. Use up to four times daily as directed. (FOR ICD-10 E10.9, E11.9).  ? CALCIUM PO Take 600 mg by mouth.  ? calcium-vitamin D (OSCAL WITH D) 500-200 MG-UNIT tablet Take 1 tablet by mouth.  ? Cholecalciferol (VITAMIN D3 PO) Take 1,000 Units by mouth.  ?  ferrous sulfate 324 MG TBEC Take 324 mg by mouth daily.  ? Lancets MISC 1 each by Does not apply route daily at 6 (six) AM.  ? Magnesium 500 MG CAPS Take by mouth.  ? ONETOUCH VERIO test strip Use up to 4 times daily as directed E10.9, E11.9  ? vitamin B-12 (CYANOCOBALAMIN) 500 MCG tablet Take 500 mcg by mouth daily.  ? [DISCONTINUED] atorvastatin (LIPITOR) 10 MG tablet TAKE 1 TABLET DAILY  ? [DISCONTINUED] cloNIDine (CATAPRES) 0.2 MG tablet Take 1 tablet (0.2 mg total) by mouth 2 (two) times daily.  ? [DISCONTINUED] doxazosin (CARDURA) 4 MG tablet Take 1 tablet (4 mg total) by mouth daily.  ? [DISCONTINUED] metFORMIN (GLUCOPHAGE) 500 MG tablet TAKE 1 TABLET TWICE DAILY  WITH MEALS **ZYDUS MFR**  ? [DISCONTINUED] metoprolol succinate (TOPROL-XL) 100 MG 24 hr tablet TAKE 1 TABLET DAILY WITH ORIMMEDIATELY FOLLOWING A    MEAL  ? [DISCONTINUED] telmisartan (MICARDIS) 80 MG tablet Take 1 tablet (80 mg total) by mouth daily.  ? ? ? ?  07/11/2021  ?  9:22 AM 02/25/2021  ?  9:46 AM 10/23/2020  ? 10:16 AM 07/25/2020  ? 11:02 AM  ?GAD 7 : Generalized Anxiety Score  ?Nervous, Anxious, on Edge 0 0 0 0  ?Control/stop worrying 0 0 0  0  ?Worry too much - different things 0 0 0 0  ?Trouble relaxing 0 0 0 0  ?Restless 0 0 0 0  ?Easily annoyed or irritable 0 0 0 0  ?Afraid - awful might happen 0 0 0 0  ?Total GAD 7 Score 0 0 0 0  ?Anxiety Difficulty  Not difficult at all Not difficult at all   ? ? ? ?  07/11/2021  ?  9:22 AM  ?Depression screen PHQ 2/9  ?Decreased Interest 0  ?Down, Depressed, Hopeless 0  ?PHQ - 2 Score 0  ?Altered sleeping 0  ?Tired, decreased energy 1  ?Change in appetite 0  ?Feeling bad or failure about yourself  0  ?Trouble concentrating 0  ?Moving slowly or fidgety/restless 0  ?Suicidal thoughts 0  ?PHQ-9 Score 1  ?Difficult doing work/chores Not difficult at all  ? ? ?BP Readings from Last 3 Encounters:  ?07/11/21 (!) 144/94  ?02/25/21 112/66  ?10/23/20 (!) 142/84  ? ? ?Physical Exam ?Vitals and nursing note  reviewed.  ?Constitutional:   ?   General: She is not in acute distress. ?   Appearance: She is well-developed.  ?HENT:  ?   Head: Normocephalic and atraumatic.  ?   Right Ear: Tympanic membrane and ear canal

## 2021-07-11 NOTE — Telephone Encounter (Signed)
Patient only wanted October appt. Patient didn't want to schedule the 2 mo and the 4 mo. I scheduled Patient appt in October like she asked. Patient stated she has too much going on.  ?

## 2021-07-13 LAB — MICROALBUMIN / CREATININE URINE RATIO
Creatinine, Urine: 21 mg/dL
Microalb/Creat Ratio: 43 mg/g creat — ABNORMAL HIGH (ref 0–29)
Microalbumin, Urine: 9.1 ug/mL

## 2021-07-13 LAB — SPECIMEN STATUS REPORT

## 2021-07-16 ENCOUNTER — Ambulatory Visit: Payer: Self-pay | Admitting: *Deleted

## 2021-07-16 LAB — LIPID PANEL
Chol/HDL Ratio: 2.5 ratio (ref 0.0–4.4)
Cholesterol, Total: 146 mg/dL (ref 100–199)
HDL: 59 mg/dL (ref >39)
LDL Chol Calc (NIH): 74 mg/dL (ref 0–99)
Triglycerides: 67 mg/dL (ref 0–149)
VLDL Cholesterol Cal: 13 mg/dL (ref 5–40)

## 2021-07-16 LAB — CBC WITH DIFFERENTIAL/PLATELET
Basophils Absolute: 0 10*3/uL (ref 0.0–0.2)
Basos: 1 %
EOS (ABSOLUTE): 0.3 10*3/uL (ref 0.0–0.4)
Eos: 7 %
Hematocrit: 35 % (ref 34.0–46.6)
Hemoglobin: 11.1 g/dL (ref 11.1–15.9)
Immature Grans (Abs): 0 10*3/uL (ref 0.0–0.1)
Immature Granulocytes: 0 %
Lymphocytes Absolute: 1.9 10*3/uL (ref 0.7–3.1)
Lymphs: 41 %
MCH: 29.1 pg (ref 26.6–33.0)
MCHC: 31.7 g/dL (ref 31.5–35.7)
MCV: 92 fL (ref 79–97)
Monocytes Absolute: 0.4 10*3/uL (ref 0.1–0.9)
Monocytes: 9 %
Neutrophils Absolute: 1.9 10*3/uL (ref 1.4–7.0)
Neutrophils: 42 %
Platelets: 138 10*3/uL — ABNORMAL LOW (ref 150–450)
RBC: 3.81 x10E6/uL (ref 3.77–5.28)
RDW: 13.8 % (ref 11.7–15.4)
WBC: 4.6 10*3/uL (ref 3.4–10.8)

## 2021-07-16 LAB — MICROALBUMIN / CREATININE URINE RATIO

## 2021-07-16 LAB — COMPREHENSIVE METABOLIC PANEL
ALT: 5 IU/L (ref 0–32)
AST: 17 IU/L (ref 0–40)
Albumin/Globulin Ratio: 1.1 — ABNORMAL LOW (ref 1.2–2.2)
Albumin: 4.1 g/dL (ref 3.6–4.6)
Alkaline Phosphatase: 67 IU/L (ref 44–121)
BUN/Creatinine Ratio: 22 (ref 12–28)
BUN: 18 mg/dL (ref 8–27)
Bilirubin Total: 0.5 mg/dL (ref 0.0–1.2)
CO2: 24 mmol/L (ref 20–29)
Calcium: 9.5 mg/dL (ref 8.7–10.3)
Chloride: 104 mmol/L (ref 96–106)
Creatinine, Ser: 0.83 mg/dL (ref 0.57–1.00)
Globulin, Total: 3.6 g/dL (ref 1.5–4.5)
Glucose: 107 mg/dL — ABNORMAL HIGH (ref 70–99)
Potassium: 3.9 mmol/L (ref 3.5–5.2)
Sodium: 143 mmol/L (ref 134–144)
Total Protein: 7.7 g/dL (ref 6.0–8.5)
eGFR: 70 mL/min/{1.73_m2} (ref >59)

## 2021-07-16 LAB — HEMOGLOBIN A1C
Est. average glucose Bld gHb Est-mCnc: 137 mg/dL
Hgb A1c MFr Bld: 6.4 % — ABNORMAL HIGH (ref 4.8–5.6)

## 2021-07-16 LAB — TSH: TSH: 2.49 u[IU]/mL (ref 0.450–4.500)

## 2021-07-16 NOTE — Telephone Encounter (Signed)
Pt given lab results per notes of Dr. Judithann Graves on 07/16/21. Pt verbalized understanding and reports she will not return in 3 months due to surgery.  ?

## 2021-12-17 LAB — HM DIABETES EYE EXAM

## 2021-12-31 ENCOUNTER — Encounter: Payer: Self-pay | Admitting: Internal Medicine

## 2021-12-31 ENCOUNTER — Ambulatory Visit (INDEPENDENT_AMBULATORY_CARE_PROVIDER_SITE_OTHER): Payer: Medicare HMO | Admitting: Internal Medicine

## 2021-12-31 VITALS — BP 118/76 | HR 53 | Ht 64.0 in | Wt 138.0 lb

## 2021-12-31 DIAGNOSIS — E782 Mixed hyperlipidemia: Secondary | ICD-10-CM | POA: Diagnosis not present

## 2021-12-31 DIAGNOSIS — E118 Type 2 diabetes mellitus with unspecified complications: Secondary | ICD-10-CM | POA: Diagnosis not present

## 2021-12-31 DIAGNOSIS — R809 Proteinuria, unspecified: Secondary | ICD-10-CM

## 2021-12-31 DIAGNOSIS — E1129 Type 2 diabetes mellitus with other diabetic kidney complication: Secondary | ICD-10-CM | POA: Diagnosis not present

## 2021-12-31 DIAGNOSIS — I10 Essential (primary) hypertension: Secondary | ICD-10-CM

## 2021-12-31 LAB — POCT GLYCOSYLATED HEMOGLOBIN (HGB A1C): Hemoglobin A1C: 6.2 % — AB (ref 4.0–5.6)

## 2021-12-31 IMAGING — CT CT ABD-PELV W/ CM
2 of 5 series · 15 of 46 positions shown, 17 images · IV contrast (omnipaque)
Comparison: Chest radiograph 07/14/2020

CLINICAL DATA: Nausea, vomiting with chills

EXAM:
CT ABDOMEN AND PELVIS WITH CONTRAST
TECHNIQUE: Multidetector CT imaging of the abdomen and pelvis was performed
using the standard protocol following bolus administration of
intravenous contrast.
CONTRAST:  100mL OMNIPAQUE IOHEXOL 300 MG/ML  SOLN

[Series 2: axial st · axial · 0.79mm/px · z∈[-1176,-806]mm · 12 of 84 slices shown, 14 images]
[im 5/84  soft-tissue]
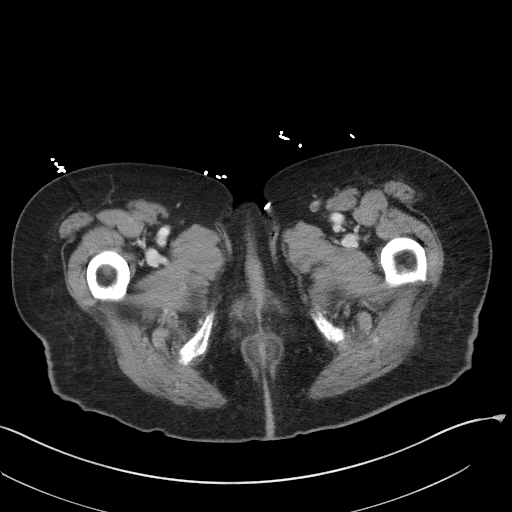
[im 5/84  bone]
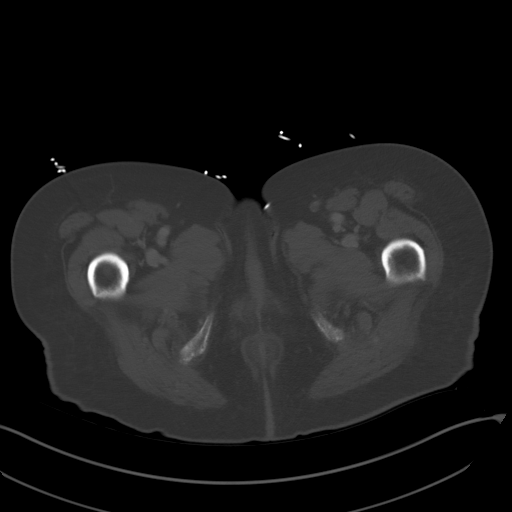
[im 14/84  soft-tissue]
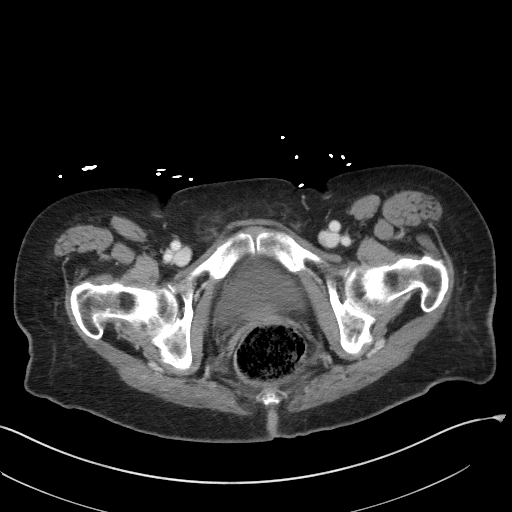
[im 19/84  soft-tissue]
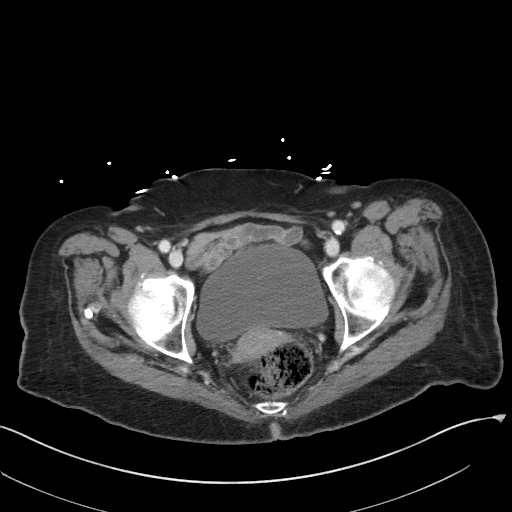
[im 24/84  soft-tissue]
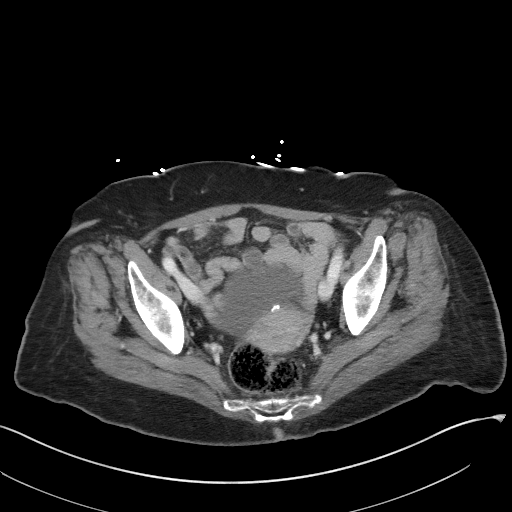
[im 33/84  soft-tissue]
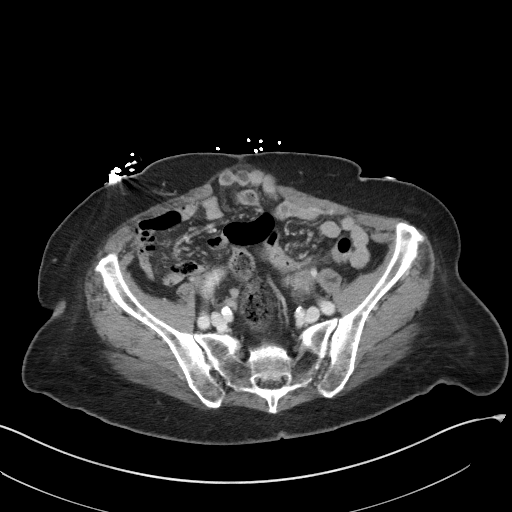
[im 37/84  soft-tissue]
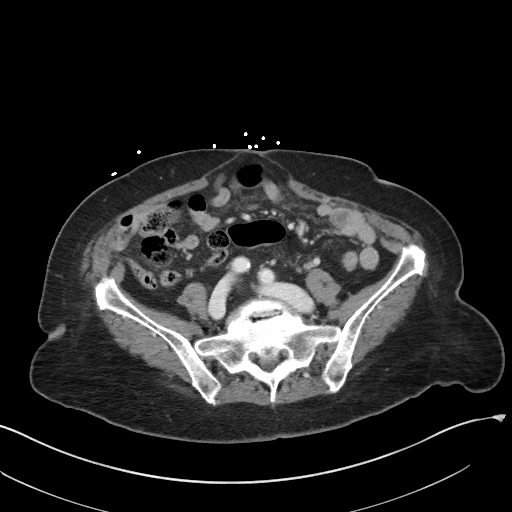
[im 47/84  soft-tissue]
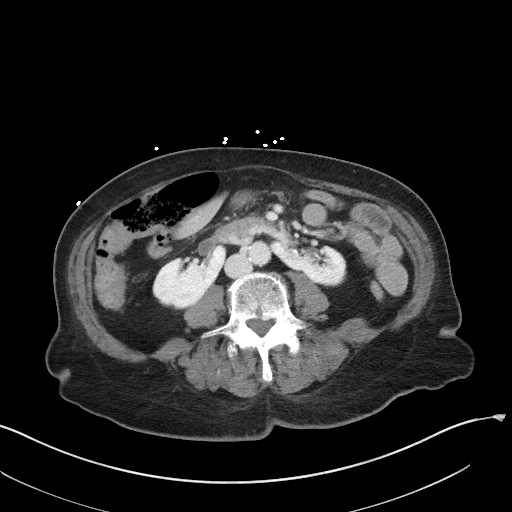
[im 51/84  soft-tissue]
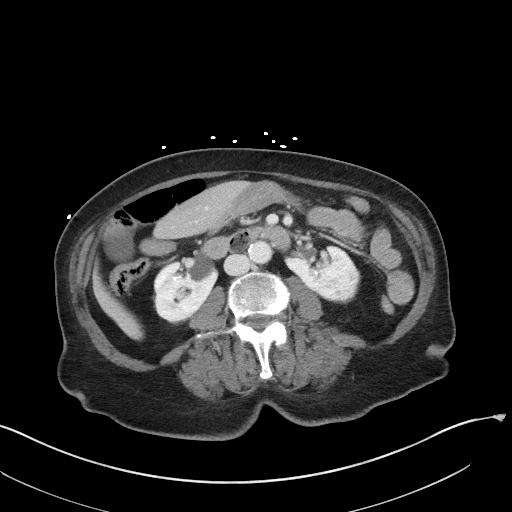
[im 60/84  soft-tissue]
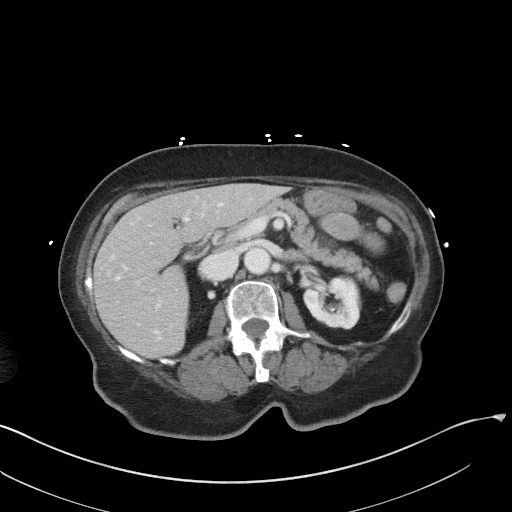
[im 60/84  bone]
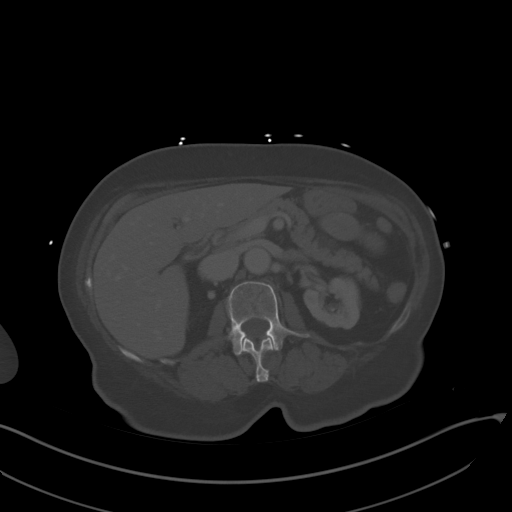
[im 65/84  soft-tissue]
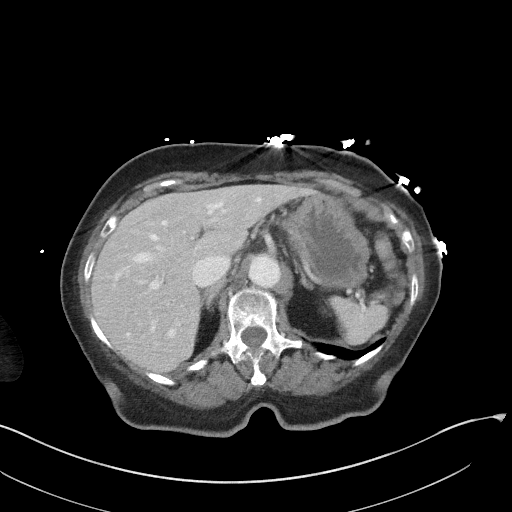
[im 70/84  soft-tissue]
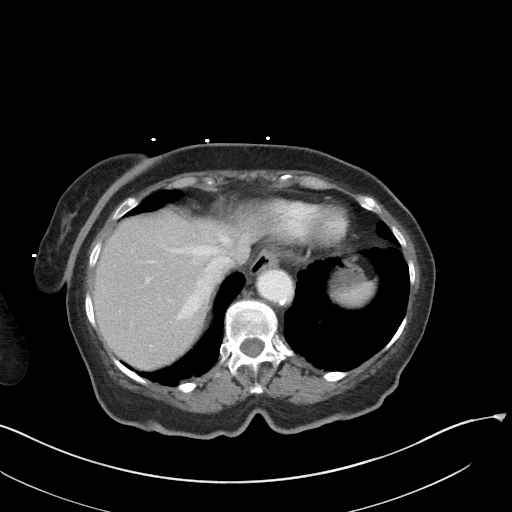
[im 79/84  soft-tissue]
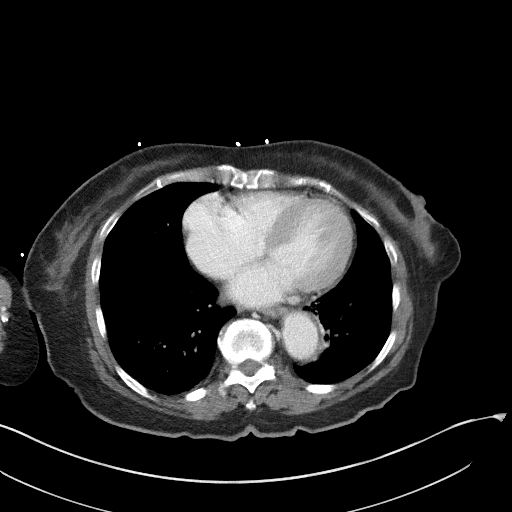

[Series 5: coronal st · coronal · 0.76mm/px · 3 of 97 slices shown]
[im 33/97  soft-tissue]
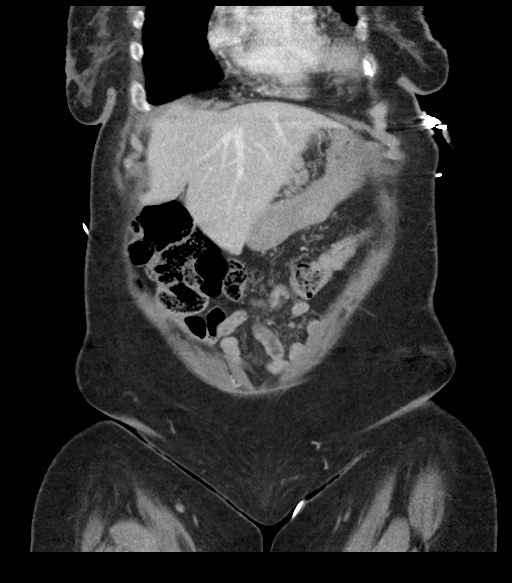
[im 43/97  soft-tissue]
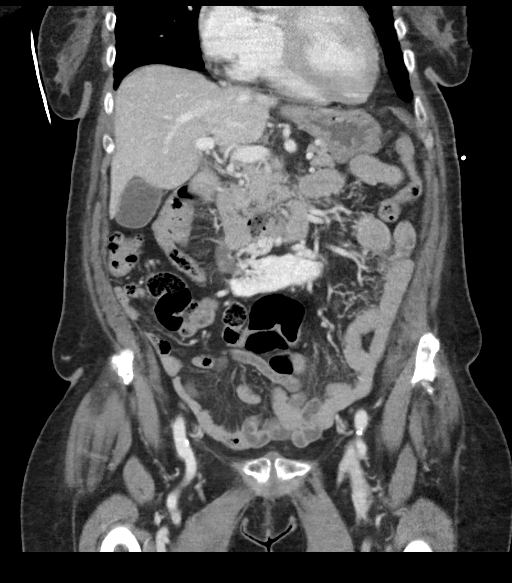
[im 54/97  soft-tissue]
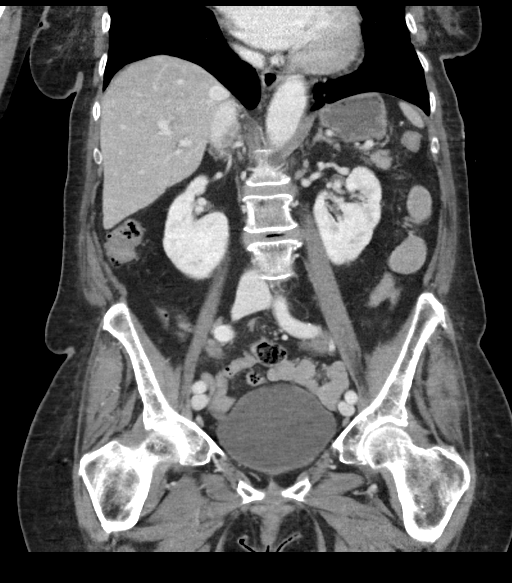

[15 of 46 positions shown; findings below may reference images not displayed]

FINDINGS: Lower chest: Cardiomegaly. Coronary artery calcifications. No
pericardial effusion. Some atelectatic changes are present in the
lung bases. Lung bases otherwise clear.

Hepatobiliary: No worrisome focal liver lesions. Smooth liver
surface contour. Normal hepatic attenuation. Prominent fold of the
gallbladder fundus without significant gallbladder wall thickening
or pericholecystic fluid or inflammation. No significant biliary
ductal dilatation or visible intraductal gallstones.

Pancreas: No pancreatic ductal dilatation or surrounding
inflammatory changes.

Spleen: Normal in size. No concerning splenic lesions.

Adrenals/Urinary Tract: No discrete adrenal masses or lesions.
Horseshoe morphology of the kidney mild bilateral pelviectasis with
abrupt transition at the ureteropelvic junction, could reflect mild
UPJ obstructions. Punctate nonobstructing calculi seen in both the
left and right renal moiety without obstructing calculus few fluid
attenuation cysts are present. Additional subcentimeter
hypoattenuating foci in both kidneys too small to fully characterize
on CT imaging but statistically likely benign. Urinary bladder is
unremarkable for the degree of distention.

Stomach/Bowel: Distal esophagus, stomach and duodenum are
unremarkable. No small bowel thickening or dilatation. Some
fecalization of the distal small bowel contents. Noninflamed
appendix in the right lower quadrant. No colonic dilatation or wall
thickening accounting for our degree of distention. Several loops of
bowel are seen within the broad-based ventral rectus diastasis
without evidence of mechanical obstruction at this level or
elsewhere within the bowel.

Vascular/Lymphatic: Atherosclerotic calcifications within the
abdominal aorta and branch vessels. No aneurysm or ectasia. No
enlarged abdominopelvic lymph nodes.

Reproductive: Anteverted uterus. Few calcified uterine fibroids. No
concerning adnexal lesion.

Other: No abdominopelvic free fluid or free gas. No bowel containing
hernias.

Musculoskeletal: Multilevel degenerative changes are present in the
imaged portions of the spine. No acute osseous abnormality or
suspicious osseous lesion. Grade 1 anterolisthesis L4 on 5 without
associated spondylolysis. Additional degenerative changes in the
hips and pelvis.
IMPRESSION: 1. Horseshoe morphology of the kidney mild bilateral pelviectasis
with transition at the ureteropelvic junction, could reflect mild
UPJ obstructions. No frank hydronephrosis or obstructive uropathy.
Punctate nonobstructing calculi in both the left and right renal
moiety.
2. Some fecalization of the distal small bowel contents, correlate
for slowed intestinal transit/constipation.
3. Broad-based ventral rectus diastasis without evidence of
mechanical obstruction at this level or elsewhere within the bowel.
4. Calcified uterine fibroids.
5. Aortic Atherosclerosis (OPIH7-CDS.S).
6. Cardiomegaly and coronary artery atherosclerosis.

## 2021-12-31 MED ORDER — ATORVASTATIN CALCIUM 10 MG PO TABS: 10.0000 mg | ORAL_TABLET | Freq: Every day | ORAL | 1 refills | Status: DC

## 2021-12-31 MED ORDER — TELMISARTAN 80 MG PO TABS: 80.0000 mg | ORAL_TABLET | Freq: Every day | ORAL | 1 refills | Status: DC

## 2021-12-31 MED ORDER — DOXAZOSIN MESYLATE 4 MG PO TABS: 4.0000 mg | ORAL_TABLET | Freq: Every day | ORAL | 1 refills | Status: DC

## 2021-12-31 MED ORDER — LANCETS MISC: 1.0000 | Freq: Every day | 3 refills | Status: DC

## 2021-12-31 MED ORDER — METOPROLOL SUCCINATE ER 100 MG PO TB24: ORAL_TABLET | ORAL | 1 refills | Status: DC

## 2021-12-31 MED ORDER — METFORMIN HCL 500 MG PO TABS: ORAL_TABLET | ORAL | 1 refills | Status: DC

## 2021-12-31 MED ORDER — CLONIDINE HCL 0.2 MG PO TABS: 0.2000 mg | ORAL_TABLET | Freq: Two times a day (BID) | ORAL | 1 refills | Status: DC

## 2021-12-31 NOTE — Progress Notes (Signed)
Date:  12/31/2021   Name:  Amber Padilla   DOB:  1935-09-19   MRN:  [id number]   Chief Complaint: Hypertension and Prediabetes  Hypertension This is a chronic problem. The problem is controlled (at home 125-139/80). Pertinent negatives include no chest pain, headaches, palpitations or shortness of breath. Past treatments include angiotensin blockers, beta blockers, alpha 1 blockers, central alpha agonists and lifestyle changes. There is no history of kidney disease, CAD/MI or CVA.  Diabetes She presents for her follow-up diabetic visit. She has type 2 diabetes mellitus. Her disease course has been stable (BS in 120 range). There are no hypoglycemic associated symptoms. Pertinent negatives for hypoglycemia include no dizziness, headaches, nervousness/anxiousness or tremors. Pertinent negatives for diabetes include no chest pain, no fatigue, no polydipsia and no polyuria. There are no hypoglycemic complications. Diabetic complications include nephropathy. Pertinent negatives for diabetic complications include no CVA or heart disease. Current diabetic treatment includes oral agent (monotherapy) (metformin). An ACE inhibitor/angiotensin II receptor blocker is not being taken.    Lab Results  Component Value Date   NA 143 07/11/2021   K 3.9 07/11/2021   CO2 24 07/11/2021   GLUCOSE 107 (H) 07/11/2021   BUN 18 07/11/2021   CREATININE 0.83 07/11/2021   CALCIUM 9.5 07/11/2021   EGFR 70 07/11/2021   GFRNONAA 60 (L) 07/15/2020   Lab Results  Component Value Date   CHOL 146 07/11/2021   HDL 59 07/11/2021   LDLCALC 74 07/11/2021   TRIG 67 07/11/2021   CHOLHDL 2.5 07/11/2021   Lab Results  Component Value Date   TSH 2.490 07/11/2021   Lab Results  Component Value Date   HGBA1C 6.2 (A) 12/31/2021   Lab Results  Component Value Date   WBC 4.6 07/11/2021   HGB 11.1 07/11/2021   HCT 35.0 07/11/2021   MCV 92 07/11/2021   PLT 138 (L) 07/11/2021   Lab Results  Component Value Date    ALT 5 07/11/2021   AST 17 07/11/2021   ALKPHOS 67 07/11/2021   BILITOT 0.5 07/11/2021   No results found for: "25OHVITD2", "25OHVITD3", "VD25OH"   Review of Systems  Constitutional:  Negative for appetite change, fatigue, fever and unexpected weight change.  HENT:  Negative for trouble swallowing.   Eyes:  Negative for visual disturbance.  Respiratory:  Negative for chest tightness and shortness of breath.   Cardiovascular:  Negative for chest pain, palpitations and leg swelling.  Gastrointestinal:  Negative for abdominal pain and constipation.  Endocrine: Negative for polydipsia and polyuria.  Genitourinary:  Negative for dysuria and hematuria.  Musculoskeletal:  Positive for arthralgias (left foot pain off and on).  Neurological:  Negative for dizziness, tremors, numbness and headaches.  Psychiatric/Behavioral:  Negative for dysphoric mood and sleep disturbance. The patient is not nervous/anxious.     Patient Active Problem List   Diagnosis Date Noted   Proteinuria due to type 2 diabetes mellitus ([place]) 12/31/2021   Osteopenia determined by x-ray 07/04/2021   Horseshoe kidney with renal calculus 07/25/2020   Aortic atherosclerosis ([place]) 07/25/2020   Hypokalemia    Type 2 diabetes mellitus without complication, without long-term current use of insulin ([place]) 12/28/2018   Essential hypertension 12/28/2018   Hyperlipidemia associated with type 2 diabetes mellitus ([place]) 12/28/2018    Allergies  Allergen Reactions   Norvasc [Amlodipine] Swelling    Past Surgical History:  Procedure Laterality Date   BUNIONECTOMY Bilateral 2005    Social History   Tobacco Use   Smoking  status: Never   Smokeless tobacco: Never  Vaping Use   Vaping Use: Never used  Substance Use Topics   Alcohol use: Never   Drug use: Never     Medication list has been reviewed and updated.  Current Meds  Medication Sig   acetaminophen (TYLENOL) 500 MG tablet Take 1,000 mg by mouth every 6 (six)  hours as needed.   aspirin EC 81 MG tablet Take 81 mg by mouth daily.   blood glucose meter kit and supplies Dispense based on patient and insurance preference. Use up to four times daily as directed. (FOR ICD-10 E10.9, E11.9).   CALCIUM PO Take 600 mg by mouth.   calcium-vitamin D (OSCAL WITH D) 500-200 MG-UNIT tablet Take 1 tablet by mouth.   Cholecalciferol (VITAMIN D3 PO) Take 1,000 Units by mouth.   ferrous sulfate 324 MG TBEC Take 324 mg by mouth daily.   Magnesium 500 MG CAPS Take by mouth.   ONETOUCH VERIO test strip Use up to 4 times daily as directed E10.9, E11.9   vitamin B-12 (CYANOCOBALAMIN) 500 MCG tablet Take 500 mcg by mouth daily.   [DISCONTINUED] atorvastatin (LIPITOR) 10 MG tablet Take 1 tablet (10 mg total) by mouth daily.   [DISCONTINUED] cloNIDine (CATAPRES) 0.2 MG tablet Take 1 tablet (0.2 mg total) by mouth 2 (two) times daily.   [DISCONTINUED] doxazosin (CARDURA) 4 MG tablet Take 1 tablet (4 mg total) by mouth daily.   [DISCONTINUED] Lancets MISC 1 each by Does not apply route daily at 6 (six) AM.   [DISCONTINUED] metFORMIN (GLUCOPHAGE) 500 MG tablet TAKE 1 TABLET TWICE DAILY  WITH MEALS **ZYDUS MFR**   [DISCONTINUED] metoprolol succinate (TOPROL-XL) 100 MG 24 hr tablet TAKE 1 TABLET DAILY WITH ORIMMEDIATELY FOLLOWING A    MEAL   [DISCONTINUED] telmisartan (MICARDIS) 80 MG tablet Take 1 tablet (80 mg total) by mouth daily.       12/31/2021   10:18 AM 07/11/2021    9:22 AM 02/25/2021    9:46 AM 10/23/2020   10:16 AM  GAD 7 : Generalized Anxiety Score  Nervous, Anxious, on Edge 0 0 0 0  Control/stop worrying 0 0 0 0  Worry too much - different things 0 0 0 0  Trouble relaxing 0 0 0 0  Restless 0 0 0 0  Easily annoyed or irritable 0 0 0 0  Afraid - awful might happen 0 0 0 0  Total GAD 7 Score 0 0 0 0  Anxiety Difficulty Not difficult at all  Not difficult at all Not difficult at all       12/31/2021   10:18 AM 07/11/2021    9:22 AM 02/25/2021    9:46 AM   Depression screen PHQ 2/9  Decreased Interest 0 0 0  Down, Depressed, Hopeless 0 0 0  PHQ - 2 Score 0 0 0  Altered sleeping 0 0 0  Tired, decreased energy 0 1 2  Change in appetite 0 0 0  Feeling bad or failure about yourself  0 0 0  Trouble concentrating 0 0 0  Moving slowly or fidgety/restless 0 0 0  Suicidal thoughts 0 0 0  PHQ-9 Score 0 1 2  Difficult doing work/chores Not difficult at all Not difficult at all Not difficult at all    BP Readings from Last 3 Encounters:  12/31/21 118/76  07/11/21 (!) 144/94  02/25/21 112/66    Physical Exam Vitals and nursing note reviewed.  Constitutional:  General: She is not in acute distress.    Appearance: Normal appearance. She is well-developed.  HENT:     Head: Normocephalic and atraumatic.  Neck:     Vascular: No carotid bruit.  Cardiovascular:     Rate and Rhythm: Normal rate and regular rhythm.     Heart sounds: No murmur heard. Pulmonary:     Effort: Pulmonary effort is normal. No respiratory distress.     Breath sounds: No wheezing or rhonchi.  Musculoskeletal:        General: No tenderness (left foot - no swelling or redness, no tenderness to palpation, no s/s of Morton's neuroma).     Cervical back: Normal range of motion.     Right lower leg: No edema.     Left lower leg: No edema.  Lymphadenopathy:     Cervical: No cervical adenopathy.  Skin:    General: Skin is warm and dry.     Capillary Refill: Capillary refill takes less than 2 seconds.     Findings: No rash.  Neurological:     General: No focal deficit present.     Mental Status: She is alert and oriented to person, place, and time.  Psychiatric:        Mood and Affect: Mood normal.        Behavior: Behavior normal.     Wt Readings from Last 3 Encounters:  12/31/21 138 lb (62.6 kg)  07/11/21 146 lb 3.2 oz (66.3 kg)  02/25/21 140 lb 6.4 oz (63.7 kg)    BP 118/76   Pulse (!) 53   Ht 5' 4" (1.626 m)   Wt 138 lb (62.6 kg)   SpO2 97%   BMI  23.69 kg/m   Assessment and Plan: 1. Essential hypertension Clinically stable exam with well controlled BP. Tolerating medications without side effects at this time. Pt to continue current regimen and low sodium diet; benefits of regular exercise as able discussed. - cloNIDine (CATAPRES) 0.2 MG tablet; Take 1 tablet (0.2 mg total) by mouth 2 (two) times daily.  Dispense: 180 tablet; Refill: 1 - doxazosin (CARDURA) 4 MG tablet; Take 1 tablet (4 mg total) by mouth daily.  Dispense: 90 tablet; Refill: 1 - metoprolol succinate (TOPROL-XL) 100 MG 24 hr tablet; TAKE 1 TABLET DAILY WITH ORIMMEDIATELY FOLLOWING A    MEAL  Dispense: 90 tablet; Refill: 1 - telmisartan (MICARDIS) 80 MG tablet; Take 1 tablet (80 mg total) by mouth daily.  Dispense: 90 tablet; Refill: 1  2. Type II diabetes mellitus with complication (HCC) Clinically stable by exam and report without s/s of hypoglycemia. DM complicated by hypertension and dyslipidemia. Tolerating medications well without side effects or other concerns. Albuminuria - should be treated by Telmisartan. Eye exam current - POCT glycosylated hemoglobin (Hb A1C) - Lancets MISC; 1 each by Does not apply route daily at 6 (six) AM.  Dispense: 100 each; Refill: 3 - metFORMIN (GLUCOPHAGE) 500 MG tablet; TAKE 1 TABLET TWICE DAILY  WITH MEALS **ZYDUS MFR**  Dispense: 180 tablet; Refill: 1  3. Proteinuria due to type 2 diabetes mellitus (HCC) Continue ARB  4. Mixed hyperlipidemia Tolerating statin medication without side effects at this time LDL is almost at goal of < 70 on current dose Continue same therapy without change at this time. - atorvastatin (LIPITOR) 10 MG tablet; Take 1 tablet (10 mg total) by mouth daily.  Dispense: 90 tablet; Refill: 1   Partially dictated using Editor, commissioning. Any errors are unintentional.  Halina Maidens,  MD [facility]  12/31/2021      

## 2022-01-14 ENCOUNTER — Telehealth: Payer: Self-pay | Admitting: Internal Medicine

## 2022-01-14 NOTE — Telephone Encounter (Signed)
Copied from [place] [id number]. Topic: Medicare AWV >> Jan 14, 2022  2:03 PM Jae Dire wrote: Reason for CRM:  Left message for patient to call back and schedule Medicare Annual Wellness Visit (AWV) in office.   If unable to come into the office for AWV,  please offer to do virtually or by telephone.  Last AWV: 12/06/2019  Please schedule at any time with [facility].      30 minute appointment for Virtual or phone 45 minute appointment for in office or Initial virtual/phone  Any questions, please call me at [phone]

## 2022-01-14 NOTE — Telephone Encounter (Signed)
Patient explained to me that [employer] told her they would not pay for [facility] to complete her AWV.  She had completed with them in Jan 2023

## 2022-05-12 LAB — HM DIABETES EYE EXAM

## 2022-05-13 ENCOUNTER — Encounter: Payer: Self-pay | Admitting: Internal Medicine

## 2022-07-02 ENCOUNTER — Encounter: Payer: Self-pay | Admitting: Internal Medicine

## 2022-07-02 ENCOUNTER — Ambulatory Visit (INDEPENDENT_AMBULATORY_CARE_PROVIDER_SITE_OTHER): Payer: Medicare HMO

## 2022-07-02 ENCOUNTER — Ambulatory Visit (INDEPENDENT_AMBULATORY_CARE_PROVIDER_SITE_OTHER): Payer: Medicare HMO | Admitting: Internal Medicine

## 2022-07-02 VITALS — Ht 64.0 in | Wt 137.0 lb

## 2022-07-02 VITALS — BP 128/76 | HR 54 | Ht 64.0 in | Wt 137.0 lb

## 2022-07-02 DIAGNOSIS — M62830 Muscle spasm of back: Secondary | ICD-10-CM

## 2022-07-02 DIAGNOSIS — E785 Hyperlipidemia, unspecified: Secondary | ICD-10-CM

## 2022-07-02 DIAGNOSIS — E1169 Type 2 diabetes mellitus with other specified complication: Secondary | ICD-10-CM | POA: Diagnosis not present

## 2022-07-02 DIAGNOSIS — Z0000 Encounter for general adult medical examination without abnormal findings: Secondary | ICD-10-CM

## 2022-07-02 DIAGNOSIS — I700 Atherosclerosis of aorta: Secondary | ICD-10-CM

## 2022-07-02 DIAGNOSIS — I10 Essential (primary) hypertension: Secondary | ICD-10-CM | POA: Diagnosis not present

## 2022-07-02 DIAGNOSIS — E119 Type 2 diabetes mellitus without complications: Secondary | ICD-10-CM | POA: Diagnosis not present

## 2022-07-02 NOTE — Assessment & Plan Note (Signed)
On appropriate ASA and statin.

## 2022-07-02 NOTE — Assessment & Plan Note (Signed)
Tolerating statin medications without concerns LDL is  Lab Results  Component Value Date   LDLCALC 74 07/11/2021   with a goal of < 70. Current dose will be adjusted if needed.

## 2022-07-02 NOTE — Progress Notes (Signed)
Date:  07/02/2022   Name:  Amber Padilla   DOB:  10-14-1935   MRN:  [id number]   Chief Complaint: Annual Exam Amber Padilla is a 87 y.o. female who presents today for her Complete Annual Exam. She feels well. She reports exercising/ walking. She reports she is sleeping well. Breast complaints - none.  Mammogram: 2019 aged out DEXA: 2017 osteopenia hip/spine normal Colonoscopy: none  Health Maintenance Due  Topic Date Due   DTaP/Tdap/Td (1 - Tdap) Never done   Zoster Vaccines- Shingrix (2 of 2) 05/12/2021   COVID-19 Vaccine (5 - 2023-24 season) 02/23/2022   HEMOGLOBIN A1C  07/02/2022   Diabetic kidney evaluation - eGFR measurement  07/12/2022   Diabetic kidney evaluation - Urine ACR  07/12/2022    Immunization History  Administered Date(s) Administered   Fluad Quad(high Dose 65+) 12/28/2018, 12/27/2019, 02/25/2021   Influenza-Unspecified 12/29/2021   PFIZER(Purple Top)SARS-COV-2 Vaccination 05/25/2019, 06/15/2019, 01/03/2020, 12/29/2021   Pneumococcal Conjugate-13 01/23/2014   Pneumococcal Polysaccharide-23 12/27/2012   Zoster Recombinat (Shingrix) 03/17/2021    Hypertension This is a chronic problem. The problem is controlled. Associated symptoms include shortness of breath (occasional with exertion). Pertinent negatives include no chest pain, headaches or palpitations. Past treatments include beta blockers, angiotensin blockers, alpha 1 blockers and central alpha agonists. The current treatment provides significant improvement. There are no compliance problems.  There is no history of kidney disease, CAD/MI or CVA.  Diabetes She presents for her follow-up diabetic visit. She has type 2 diabetes mellitus. Her disease course has been stable. Pertinent negatives for hypoglycemia include no dizziness, headaches, nervousness/anxiousness or tremors. Pertinent negatives for diabetes include no chest pain, no fatigue, no polydipsia and no polyuria. Pertinent negatives for diabetic  complications include no CVA. Current diabetic treatment includes oral agent (monotherapy) (metformin). An ACE inhibitor/angiotensin II receptor blocker is being taken.  Hyperlipidemia This is a chronic problem. The problem is controlled. Associated symptoms include shortness of breath (occasional with exertion). Pertinent negatives include no chest pain. Current antihyperlipidemic treatment includes statins.    Lab Results  Component Value Date   NA 143 07/11/2021   K 3.9 07/11/2021   CO2 24 07/11/2021   GLUCOSE 107 (H) 07/11/2021   BUN 18 07/11/2021   CREATININE 0.83 07/11/2021   CALCIUM 9.5 07/11/2021   EGFR 70 07/11/2021   GFRNONAA 60 (L) 07/15/2020   Lab Results  Component Value Date   CHOL 146 07/11/2021   HDL 59 07/11/2021   LDLCALC 74 07/11/2021   TRIG 67 07/11/2021   CHOLHDL 2.5 07/11/2021   Lab Results  Component Value Date   TSH 2.490 07/11/2021   Lab Results  Component Value Date   HGBA1C 6.2 (A) 12/31/2021   Lab Results  Component Value Date   WBC 4.6 07/11/2021   HGB 11.1 07/11/2021   HCT 35.0 07/11/2021   MCV 92 07/11/2021   PLT 138 (L) 07/11/2021   Lab Results  Component Value Date   ALT 5 07/11/2021   AST 17 07/11/2021   ALKPHOS 67 07/11/2021   BILITOT 0.5 07/11/2021   No results found for: "25OHVITD2", "25OHVITD3", "VD25OH"   Review of Systems  Constitutional:  Negative for chills, fatigue and fever.  HENT:  Negative for congestion, hearing loss, tinnitus, trouble swallowing and voice change.   Eyes:  Negative for visual disturbance.  Respiratory:  Positive for shortness of breath (occasional with exertion). Negative for cough, chest tightness and wheezing.   Cardiovascular:  Negative for chest pain, palpitations and  leg swelling.  Gastrointestinal:  Negative for abdominal pain, constipation, diarrhea and vomiting.  Endocrine: Negative for polydipsia and polyuria.  Genitourinary:  Negative for dysuria, frequency, genital sores, vaginal  bleeding and vaginal discharge.  Musculoskeletal:  Positive for arthralgias (in posterior left shoulder). Negative for gait problem and joint swelling.  Skin:  Negative for color change and rash.  Neurological:  Negative for dizziness, tremors, light-headedness and headaches.  Hematological:  Negative for adenopathy. Does not bruise/bleed easily.  Psychiatric/Behavioral:  Negative for dysphoric mood and sleep disturbance. The patient is not nervous/anxious.     Patient Active Problem List   Diagnosis Date Noted   Proteinuria due to type 2 diabetes mellitus 12/31/2021   Osteopenia determined by x-ray 07/04/2021   Horseshoe kidney with renal calculus 07/25/2020   Aortic atherosclerosis 07/25/2020   Hypokalemia    Type 2 diabetes mellitus without complication, without long-term current use of insulin 12/28/2018   Essential hypertension 12/28/2018   Hyperlipidemia associated with type 2 diabetes mellitus 12/28/2018    Allergies  Allergen Reactions   Norvasc [Amlodipine] Swelling    Past Surgical History:  Procedure Laterality Date   BUNIONECTOMY Bilateral 2005    Social History   Tobacco Use   Smoking status: Never   Smokeless tobacco: Never  Vaping Use   Vaping Use: Never used  Substance Use Topics   Alcohol use: Never   Drug use: Never     Medication list has been reviewed and updated.  Current Meds  Medication Sig   acetaminophen (TYLENOL) 500 MG tablet Take 1,000 mg by mouth every 6 (six) hours as needed.   aspirin EC 81 MG tablet Take 81 mg by mouth daily.   atorvastatin (LIPITOR) 10 MG tablet Take 1 tablet (10 mg total) by mouth daily.   blood glucose meter kit and supplies Dispense based on patient and insurance preference. Use up to four times daily as directed. (FOR ICD-10 E10.9, E11.9).   CALCIUM PO Take 600 mg by mouth.   calcium-vitamin D (OSCAL WITH D) 500-200 MG-UNIT tablet Take 1 tablet by mouth.   Cholecalciferol (VITAMIN D3 PO) Take 1,000 Units by  mouth.   cloNIDine (CATAPRES) 0.2 MG tablet Take 1 tablet (0.2 mg total) by mouth 2 (two) times daily.   doxazosin (CARDURA) 4 MG tablet Take 1 tablet (4 mg total) by mouth daily.   ferrous sulfate 324 MG TBEC Take 324 mg by mouth daily.   Lancets MISC 1 each by Does not apply route daily at 6 (six) AM.   Magnesium 500 MG CAPS Take by mouth.   metFORMIN (GLUCOPHAGE) 500 MG tablet TAKE 1 TABLET TWICE DAILY  WITH MEALS **ZYDUS MFR**   metoprolol succinate (TOPROL-XL) 100 MG 24 hr tablet TAKE 1 TABLET DAILY WITH ORIMMEDIATELY FOLLOWING A    MEAL   ONETOUCH VERIO test strip Use up to 4 times daily as directed E10.9, E11.9   telmisartan (MICARDIS) 80 MG tablet Take 1 tablet (80 mg total) by mouth daily.   vitamin B-12 (CYANOCOBALAMIN) 500 MCG tablet Take 500 mcg by mouth daily.       12/31/2021   10:18 AM 07/11/2021    9:22 AM 02/25/2021    9:46 AM 10/23/2020   10:16 AM  GAD 7 : Generalized Anxiety Score  Nervous, Anxious, on Edge 0 0 0 0  Control/stop worrying 0 0 0 0  Worry too much - different things 0 0 0 0  Trouble relaxing 0 0 0 0  Restless 0 0  0 0  Easily annoyed or irritable 0 0 0 0  Afraid - awful might happen 0 0 0 0  Total GAD 7 Score 0 0 0 0  Anxiety Difficulty Not difficult at all  Not difficult at all Not difficult at all       07/02/2022   10:58 AM 07/02/2022   10:57 AM 12/31/2021   10:18 AM  Depression screen PHQ 2/9  Decreased Interest 0 0 0  Down, Depressed, Hopeless 0 0 0  PHQ - 2 Score 0 0 0  Altered sleeping   0  Tired, decreased energy   0  Change in appetite   0  Feeling bad or failure about yourself    0  Trouble concentrating   0  Moving slowly or fidgety/restless   0  Suicidal thoughts   0  PHQ-9 Score   0  Difficult doing work/chores   Not difficult at all    BP Readings from Last 3 Encounters:  07/02/22 128/76  12/31/21 118/76  07/11/21 (!) 144/94    Physical Exam Vitals and nursing note reviewed.  Constitutional:      General: She is not in  acute distress.    Appearance: She is well-developed.  HENT:     Head: Normocephalic and atraumatic.     Right Ear: Tympanic membrane and ear canal normal.     Left Ear: Tympanic membrane and ear canal normal.     Nose:     Right Sinus: No maxillary sinus tenderness.     Left Sinus: No maxillary sinus tenderness.  Eyes:     General: No scleral icterus.       Right eye: No discharge.        Left eye: No discharge.     Conjunctiva/sclera: Conjunctivae normal.  Neck:     Thyroid: No thyromegaly.     Vascular: No carotid bruit.  Cardiovascular:     Rate and Rhythm: Normal rate and regular rhythm.     Pulses: Normal pulses.     Heart sounds: Normal heart sounds.  Pulmonary:     Effort: Pulmonary effort is normal. No respiratory distress.     Breath sounds: No wheezing.  Chest:  Breasts:    Right: No mass, nipple discharge, skin change or tenderness.     Left: No mass, nipple discharge, skin change or tenderness.  Abdominal:     General: Bowel sounds are normal.     Palpations: Abdomen is soft.     Tenderness: There is no abdominal tenderness.  Musculoskeletal:     Left shoulder: No swelling, deformity, bony tenderness or crepitus. Normal range of motion.       Arms:     Cervical back: Normal range of motion. No erythema.     Right lower leg: No edema.     Left lower leg: No edema.     Comments: Tender over lateral shoulder blade region with some palpable muscle spasm  Lymphadenopathy:     Cervical: No cervical adenopathy.  Skin:    General: Skin is warm and dry.     Findings: No rash.  Neurological:     General: No focal deficit present.     Mental Status: She is alert and oriented to person, place, and time.     Cranial Nerves: No cranial nerve deficit.     Sensory: No sensory deficit.     Deep Tendon Reflexes: Reflexes are normal and symmetric.  Psychiatric:  Attention and Perception: Attention normal.        Mood and Affect: Mood normal.    Diabetic Foot  Exam - Simple   Simple Foot Form Diabetic Foot exam was performed with the following findings: Yes 07/02/2022 11:03 AM  Visual Inspection No deformities, no ulcerations, no other skin breakdown bilaterally: Yes Sensation Testing Intact to touch and monofilament testing bilaterally: Yes Pulse Check Posterior Tibialis and Dorsalis pulse intact bilaterally: Yes Comments      Wt Readings from Last 3 Encounters:  07/02/22 137 lb (62.1 kg)  07/02/22 137 lb (62.1 kg)  12/31/21 138 lb (62.6 kg)    BP 128/76   Pulse (!) 54   Ht 5\' 4"  (1.626 m)   Wt 137 lb (62.1 kg)   SpO2 97%   BMI 23.52 kg/m   Assessment and Plan:  Problem List Items Addressed This Visit       Cardiovascular and Mediastinum   Aortic atherosclerosis (Chronic)    On appropriate ASA and statin.      Essential hypertension (Chronic)    Clinically stable exam with well controlled BP on clonidine, cardura, telmisartan, metoprolol. Tolerating medications without side effects. Pt to continue current regimen and low sodium diet.       Relevant Orders   CBC with Differential/Platelet   Comprehensive metabolic panel   TSH     Endocrine   Hyperlipidemia associated with type 2 diabetes mellitus (Chronic)    Tolerating statin medications without concerns LDL is  Lab Results  Component Value Date   LDLCALC 74 07/11/2021  with a goal of < 70. Current dose will be adjusted if needed.       Relevant Orders   Lipid panel   Type 2 diabetes mellitus without complication, without long-term current use of insulin (Chronic)    Clinically stable without s/s of hypoglycemia. Tolerating metformin well without side effects or other concerns. Lab Results  Component Value Date   HGBA1C 6.2 (A) 12/31/2021        Relevant Orders   Comprehensive metabolic panel   Hemoglobin A1c   Microalbumin / creatinine urine ratio   Other Visit Diagnoses     Annual physical exam    -  Primary   up to date on  immunizations aged out of mammograms   Muscle spasm of back       recommend heat and topical rubs follow up if persistent       Return in about 4 months (around 11/01/2022) for HTN, DM.   Partially dictated using [place], any errors are not intentional.  Glean Hess, MD [place], [place]

## 2022-07-02 NOTE — Assessment & Plan Note (Signed)
Clinically stable without s/s of hypoglycemia. Tolerating metformin well without side effects or other concerns. Lab Results  Component Value Date   HGBA1C 6.2 (A) 12/31/2021

## 2022-07-02 NOTE — Assessment & Plan Note (Signed)
Clinically stable exam with well controlled BP on clonidine, cardura, telmisartan, metoprolol. Tolerating medications without side effects. Pt to continue current regimen and low sodium diet.

## 2022-07-02 NOTE — Progress Notes (Signed)
Subjective:   Amber Padilla is a 87 y.o. female who presents for Medicare Annual (Subsequent) preventive examination.  I connected with  Amber Padilla on 07/02/22 by an in person visit and verified that I am speaking with the correct person using two identifiers.  Patient Location: Other:  In Office  Provider Location: Office/Clinic  I discussed the limitations of evaluation and management by telemedicine. The patient expressed understanding and agreed to proceed.   Review of Systems    Defer to PCP  Cardiac Risk Factors include: advanced age (>65men, >20 women);hypertension     Objective:    Today's Vitals   07/02/22 1055  Weight: 137 lb (62.1 kg)  Height: 5\' 4"  (1.626 m)  PainSc: 5    Body mass index is 23.52 kg/m.     07/02/2022   10:57 AM 07/14/2020    2:58 PM 07/13/2020    8:40 PM 08/06/2019    9:25 AM  Advanced Directives  Does Patient Have a Medical Advance Directive? Yes No Yes Yes  Type of Advance Directive Living will  Living will;Healthcare Power of [place];Living will  Does patient want to make changes to medical advance directive? No - Patient declined     Copy of [place] in Chart?   No - copy requested   Would patient like information on creating a medical advance directive?  No - Patient declined      Current Medications (verified) Outpatient Encounter Medications as of 07/02/2022  Medication Sig   acetaminophen (TYLENOL) 500 MG tablet Take 1,000 mg by mouth every 6 (six) hours as needed.   aspirin EC 81 MG tablet Take 81 mg by mouth daily.   atorvastatin (LIPITOR) 10 MG tablet Take 1 tablet (10 mg total) by mouth daily.   blood glucose meter kit and supplies Dispense based on patient and insurance preference. Use up to four times daily as directed. (FOR ICD-10 E10.9, E11.9).   CALCIUM PO Take 600 mg by mouth.   calcium-vitamin D (OSCAL WITH D) 500-200 MG-UNIT tablet Take 1 tablet by mouth.    Cholecalciferol (VITAMIN D3 PO) Take 1,000 Units by mouth.   cloNIDine (CATAPRES) 0.2 MG tablet Take 1 tablet (0.2 mg total) by mouth 2 (two) times daily.   doxazosin (CARDURA) 4 MG tablet Take 1 tablet (4 mg total) by mouth daily.   ferrous sulfate 324 MG TBEC Take 324 mg by mouth daily.   Lancets MISC 1 each by Does not apply route daily at 6 (six) AM.   Magnesium 500 MG CAPS Take by mouth.   metFORMIN (GLUCOPHAGE) 500 MG tablet TAKE 1 TABLET TWICE DAILY  WITH MEALS **ZYDUS MFR**   metoprolol succinate (TOPROL-XL) 100 MG 24 hr tablet TAKE 1 TABLET DAILY WITH ORIMMEDIATELY FOLLOWING A    MEAL   ONETOUCH VERIO test strip Use up to 4 times daily as directed E10.9, E11.9   telmisartan (MICARDIS) 80 MG tablet Take 1 tablet (80 mg total) by mouth daily.   vitamin B-12 (CYANOCOBALAMIN) 500 MCG tablet Take 500 mcg by mouth daily.   No facility-administered encounter medications on file as of 07/02/2022.    Allergies (verified) Norvasc [amlodipine]   History: Past Medical History:  Diagnosis Date   Allergy    Diabetes mellitus without complication    Hyperlipidemia    Hypertension    Hypertensive emergency 07/14/2020   Hypertensive urgency    Thyroid disease    Past Surgical History:  Procedure Laterality Date  BUNIONECTOMY Bilateral 2005   Family History  Problem Relation Age of Onset   Diabetes Mother    Social History   Socioeconomic History   Marital status: Single    Spouse name: Not on file   Number of children: 1   Years of education: Not on file   Highest education level: Not on file  Occupational History   Not on file  Tobacco Use   Smoking status: Never   Smokeless tobacco: Never  Vaping Use   Vaping Use: Never used  Substance and Sexual Activity   Alcohol use: Never   Drug use: Never   Sexual activity: Not Currently  Other Topics Concern   Not on file  Social History Narrative   Not on file   Social Determinants of Health   Financial Resource Strain:  Low Risk  (07/02/2022)   Overall Financial Resource Strain (CARDIA)    Difficulty of Paying Living Expenses: Not hard at all  Food Insecurity: No Food Insecurity (07/02/2022)   Hunger Vital Sign    Worried About Running Out of Food in the Last Year: Never true    [place] in the Last Year: Never true  Transportation Needs: No Transportation Needs (07/02/2022)   PRAPARE - Hydrologist (Medical): No    Lack of Transportation (Non-Medical): No  Physical Activity: Insufficiently Active (07/02/2022)   Exercise Vital Sign    Days of Exercise per Week: 4 days    Minutes of Exercise per Session: 30 min  Stress: No Stress Concern Present (07/02/2022)   [place]    Feeling of Stress : Not at all  Social Connections: Moderately Isolated (12/06/2019)   Social Connection and Isolation Panel [NHANES]    Frequency of Communication with Friends and Family: More than three times a week    Frequency of Social Gatherings with Friends and Family: Three times a week    Attends Religious Services: More than 4 times per year    Active Member of Clubs or Organizations: No    Attends Archivist Meetings: Never    Marital Status: Never married    Tobacco Counseling Not needed. Non smoker.  Clinical Intake:  Pre-visit preparation completed: Yes  Pain : 0-10 Pain Score: 5  Pain Type: Acute pain Pain Location: Shoulder Pain Descriptors / Indicators: Aching Pain Onset: 1 to 4 weeks ago     BMI - recorded: 23.52 Nutritional Status: BMI 25 -29 Overweight Nutritional Risks: None Diabetes: No  How often do you need to have someone help you when you read instructions, pamphlets, or other written materials from your doctor or pharmacy?: 1 - Never  Diabetic? Pre- diabetes  Interpreter Needed?: No  Information entered by :: Wyatt Haste, [place] of Daily Living    07/02/2022   10:58  AM 07/11/2021    9:22 AM  In your present state of health, do you have any difficulty performing the following activities:  Hearing? 0 0  Vision? 0 0  Difficulty concentrating or making decisions? 0 0  Walking or climbing stairs? 1 1  Dressing or bathing? 0 0  Doing errands, shopping? 0 0  Preparing Food and eating ? N   Using the Toilet? N   In the past six months, have you accidently leaked urine? N   Do you have problems with loss of bowel control? N   Managing your Medications? N  Managing your Finances? N   Housekeeping or managing your Housekeeping? N     Patient Care Team: Glean Hess, MD as PCP - General (Internal Medicine) Anell Barr, OD (Optometry)  Indicate any recent Medical Services you may have received from other than [facility] providers in the past year (date may be approximate).     Assessment:   This is a routine wellness examination for Maylene.  Hearing/Vision screen Hearing Screening - Comments:: No concerns. Vision Screening - Comments:: No concerns.  Dietary issues and exercise activities discussed: Current Exercise Habits: Home exercise routine, Type of exercise: walking, Time (Minutes): 30, Frequency (Times/Week): 4, Weekly Exercise (Minutes/Week): 120, Intensity: Mild   Goals Addressed   None   Depression Screen    07/02/2022   10:58 AM 07/02/2022   10:57 AM 12/31/2021   10:18 AM 07/11/2021    9:22 AM 02/25/2021    9:46 AM 10/23/2020   10:15 AM 07/25/2020   11:02 AM  PHQ 2/9 Scores  PHQ - 2 Score 0 0 0 0 0 0 0  PHQ- 9 Score   0 1 2 1 1     Fall Risk    07/02/2022   10:58 AM 12/31/2021   10:17 AM 07/11/2021    9:22 AM 02/25/2021    9:46 AM 10/23/2020   10:16 AM  Fall Risk   Falls in the past year? 0 0 0 0 0  Number falls in past yr: 0 0 0 0 0  Injury with Fall? 0 0 0 0 0  Risk for fall due to : No Fall Risks No Fall Risks No Fall Risks No Fall Risks No Fall Risks  Follow up Falls evaluation completed Falls evaluation completed Falls  evaluation completed Falls evaluation completed Falls evaluation completed    FALL RISK PREVENTION PERTAINING TO THE HOME:  Any stairs in or around the home? No  If so, are there any without handrails?  N/A Home free of loose throw rugs in walkways, pet beds, electrical cords, etc? Yes  Adequate lighting in your home to reduce risk of falls? Yes   ASSISTIVE DEVICES UTILIZED TO PREVENT FALLS:  Life alert? No  Use of a cane, walker or w/c? No   TIMED UP AND GO:  Was the test performed? Yes .  Gait slow and steady without use of assistive device  Immunizations Immunization History  Administered Date(s) Administered   Fluad Quad(high Dose 65+) 12/28/2018, 12/27/2019, 02/25/2021   Influenza-Unspecified 12/29/2021   PFIZER(Purple Top)SARS-COV-2 Vaccination 05/25/2019, 06/15/2019, 01/03/2020, 12/29/2021   Pneumococcal Conjugate-13 01/23/2014   Pneumococcal Polysaccharide-23 12/27/2012   Zoster Recombinat (Shingrix) 03/17/2021    TDAP status: Due, Education has been provided regarding the importance of this vaccine. Advised may receive this vaccine at local pharmacy or Health Dept. Aware to provide a copy of the vaccination record if obtained from local pharmacy or Health Dept. Verbalized acceptance and understanding.  Flu Vaccine status: Up to date  Pneumococcal vaccine status: Up to date  Covid-19 vaccine status: Completed vaccines  Qualifies for Shingles Vaccine? Yes   Zostavax completed Yes   Shingrix Completed?: Yes  Screening Tests Health Maintenance  Topic Date Due   DTaP/Tdap/Td (1 - Tdap) Never done   Zoster Vaccines- Shingrix (2 of 2) 05/12/2021   COVID-19 Vaccine (5 - 2023-24 season) 02/23/2022   HEMOGLOBIN A1C  07/02/2022   Diabetic kidney evaluation - eGFR measurement  07/12/2022   Diabetic kidney evaluation - Urine ACR  07/12/2022   FOOT EXAM  07/12/2022   INFLUENZA VACCINE  10/29/2022   OPHTHALMOLOGY EXAM  05/13/2023   Medicare Annual Wellness (AWV)   07/02/2023   Pneumonia Vaccine 49+ Years old  Completed   DEXA SCAN  Completed   HPV VACCINES  Aged Out    Health Maintenance  Health Maintenance Due  Topic Date Due   DTaP/Tdap/Td (1 - Tdap) Never done   Zoster Vaccines- Shingrix (2 of 2) 05/12/2021   COVID-19 Vaccine (5 - 2023-24 season) 02/23/2022   HEMOGLOBIN A1C  07/02/2022   Diabetic kidney evaluation - eGFR measurement  07/12/2022   Diabetic kidney evaluation - Urine ACR  07/12/2022    Colorectal cancer screening: No longer required.   Mammogram status: No longer required due to age.  Bone Density status: Completed 04/11/2015. Results reflect: Bone density results: OSTEOPENIA. Repeat every 2-3 years.  Lung Cancer Screening: (Low Dose CT Chest recommended if Age 53-80 years, 30 pack-year currently smoking OR have quit w/in 15years.) does not qualify.   Additional Screening:  Hepatitis C Screening: does not qualify  Vision Screening: Recommended annual ophthalmology exams for early detection of glaucoma and other disorders of the eye. Is the patient up to date with their annual eye exam?  Yes  Who is the provider or what is the name of the office in which the patient attends annual eye exams? [facility] in [place]  Dental Screening: Recommended annual dental exams for proper oral hygiene  Community Resource Referral / Chronic Care Management: CRR required this visit?  No   CCM required this visit?  No      Plan:     I have personally reviewed and noted the following in the patient's chart:   Medical and social history Use of alcohol, tobacco or illicit drugs  Current medications and supplements including opioid prescriptions. Patient is not currently taking opioid prescriptions. Functional ability and status Nutritional status Physical activity Advanced directives List of other physicians Hospitalizations, surgeries, and ER visits in previous 12 months Vitals Screenings to include cognitive,  depression, and falls Referrals and appointments  In addition, I have reviewed and discussed with patient certain preventive protocols, quality metrics, and best practice recommendations. A written personalized care plan for preventive services as well as general preventive health recommendations were provided to patient.     Clista Bernhardt, CMA   07/02/2022   Nurse Notes: None.

## 2022-07-03 LAB — HEMOGLOBIN A1C
Est. average glucose Bld gHb Est-mCnc: 134 mg/dL
Hgb A1c MFr Bld: 6.3 % — ABNORMAL HIGH (ref 4.8–5.6)

## 2022-07-03 LAB — COMPREHENSIVE METABOLIC PANEL
ALT: 7 IU/L (ref 0–32)
AST: 15 IU/L (ref 0–40)
Albumin/Globulin Ratio: 1.3 (ref 1.2–2.2)
Albumin: 4.4 g/dL (ref 3.7–4.7)
Alkaline Phosphatase: 60 IU/L (ref 44–121)
BUN/Creatinine Ratio: 17 (ref 12–28)
BUN: 18 mg/dL (ref 8–27)
Bilirubin Total: 0.7 mg/dL (ref 0.0–1.2)
CO2: 26 mmol/L (ref 20–29)
Calcium: 9.5 mg/dL (ref 8.7–10.3)
Chloride: 102 mmol/L (ref 96–106)
Creatinine, Ser: 1.07 mg/dL — ABNORMAL HIGH (ref 0.57–1.00)
Globulin, Total: 3.5 g/dL (ref 1.5–4.5)
Glucose: 108 mg/dL — ABNORMAL HIGH (ref 70–99)
Potassium: 4.4 mmol/L (ref 3.5–5.2)
Sodium: 141 mmol/L (ref 134–144)
Total Protein: 7.9 g/dL (ref 6.0–8.5)
eGFR: 51 mL/min/{1.73_m2} — ABNORMAL LOW (ref >59)

## 2022-07-03 LAB — CBC WITH DIFFERENTIAL/PLATELET
Basophils Absolute: 0 10*3/uL (ref 0.0–0.2)
Basos: 1 %
EOS (ABSOLUTE): 0.2 10*3/uL (ref 0.0–0.4)
Eos: 5 %
Hematocrit: 33.9 % — ABNORMAL LOW (ref 34.0–46.6)
Hemoglobin: 10.8 g/dL — ABNORMAL LOW (ref 11.1–15.9)
Immature Grans (Abs): 0 10*3/uL (ref 0.0–0.1)
Immature Granulocytes: 0 %
Lymphocytes Absolute: 1.8 10*3/uL (ref 0.7–3.1)
Lymphs: 35 %
MCH: 29.8 pg (ref 26.6–33.0)
MCHC: 31.9 g/dL (ref 31.5–35.7)
MCV: 93 fL (ref 79–97)
Monocytes Absolute: 0.5 10*3/uL (ref 0.1–0.9)
Monocytes: 9 %
Neutrophils Absolute: 2.6 10*3/uL (ref 1.4–7.0)
Neutrophils: 50 %
Platelets: 171 10*3/uL (ref 150–450)
RBC: 3.63 x10E6/uL — ABNORMAL LOW (ref 3.77–5.28)
RDW: 13.7 % (ref 11.7–15.4)
WBC: 5.2 10*3/uL (ref 3.4–10.8)

## 2022-07-03 LAB — MICROALBUMIN / CREATININE URINE RATIO
Creatinine, Urine: 431.9 mg/dL
Microalb/Creat Ratio: 11 mg/g creat (ref 0–29)
Microalbumin, Urine: 49.5 ug/mL

## 2022-07-03 LAB — LIPID PANEL
Chol/HDL Ratio: 2.6 ratio (ref 0.0–4.4)
Cholesterol, Total: 153 mg/dL (ref 100–199)
HDL: 60 mg/dL (ref >39)
LDL Chol Calc (NIH): 78 mg/dL (ref 0–99)
Triglycerides: 75 mg/dL (ref 0–149)
VLDL Cholesterol Cal: 15 mg/dL (ref 5–40)

## 2022-07-03 LAB — TSH: TSH: 3.46 u[IU]/mL (ref 0.450–4.500)

## 2022-07-16 ENCOUNTER — Other Ambulatory Visit: Payer: Self-pay | Admitting: Internal Medicine

## 2022-07-16 DIAGNOSIS — E118 Type 2 diabetes mellitus with unspecified complications: Secondary | ICD-10-CM

## 2022-07-16 DIAGNOSIS — I10 Essential (primary) hypertension: Secondary | ICD-10-CM

## 2022-07-16 DIAGNOSIS — E782 Mixed hyperlipidemia: Secondary | ICD-10-CM

## 2022-07-16 NOTE — Telephone Encounter (Signed)
Medication Refill - Medication: atorvastatin (LIPITOR) 10 MG tablet cloNIDine (CATAPRES) 0.2 MG tablet metFORMIN (GLUCOPHAGE) 500 MG tablet metoprolol succinate (TOPROL-XL) 100 MG 24 hr tablet doxazosin (CARDURA) 4 MG tablet telmisartan (MICARDIS) 80 MG tablet   Has the patient contacted their pharmacy? No. Cahnte from pharmacy called with callback#[phone] and UJW#[id number]  Preferred Pharmacy (with phone number or street name):  [facility] - [place], [place] - [address] AT Portal [facility] Phone: [phone]  Fax: [phone]     Has the patient been seen for an appointment in the last year OR does the patient have an upcoming appointment? No.  Agent: Please be advised that RX refills may take up to 3 business days. We ask that you follow-up with your pharmacy.

## 2022-07-17 MED ORDER — ATORVASTATIN CALCIUM 10 MG PO TABS
10.0000 mg | ORAL_TABLET | Freq: Every day | ORAL | 1 refills | Status: DC
Start: 2022-07-17 — End: 2023-05-11

## 2022-07-17 MED ORDER — METFORMIN HCL 500 MG PO TABS
ORAL_TABLET | ORAL | 1 refills | Status: DC
Start: 2022-07-17 — End: 2023-09-03

## 2022-07-17 MED ORDER — METOPROLOL SUCCINATE ER 100 MG PO TB24
ORAL_TABLET | ORAL | 1 refills | Status: DC
Start: 2022-07-17 — End: 2022-12-20

## 2022-07-17 MED ORDER — TELMISARTAN 80 MG PO TABS
80.0000 mg | ORAL_TABLET | Freq: Every day | ORAL | 1 refills | Status: DC
Start: 2022-07-17 — End: 2022-12-20

## 2022-07-17 MED ORDER — DOXAZOSIN MESYLATE 4 MG PO TABS
4.0000 mg | ORAL_TABLET | Freq: Every day | ORAL | 1 refills | Status: DC
Start: 2022-07-17 — End: 2022-12-20

## 2022-07-17 MED ORDER — CLONIDINE HCL 0.2 MG PO TABS
0.2000 mg | ORAL_TABLET | Freq: Two times a day (BID) | ORAL | 1 refills | Status: DC
Start: 2022-07-17 — End: 2022-12-20

## 2022-07-17 NOTE — Telephone Encounter (Signed)
Requested Prescriptions  Pending Prescriptions Disp Refills   atorvastatin (LIPITOR) 10 MG tablet 90 tablet 1    Sig: Take 1 tablet (10 mg total) by mouth daily.     Cardiovascular:  Antilipid - Statins Failed - 07/16/2022  1:29 PM      Failed - Lipid Panel in normal range within the last 12 months    Cholesterol, Total  Date Value Ref Range Status  07/02/2022 153 100 - 199 mg/dL Final   LDL Chol Calc (NIH)  Date Value Ref Range Status  07/02/2022 78 0 - 99 mg/dL Final   HDL  Date Value Ref Range Status  07/02/2022 60 >39 mg/dL Final   Triglycerides  Date Value Ref Range Status  07/02/2022 75 0 - 149 mg/dL Final         Passed - Patient is not pregnant      Passed - Valid encounter within last 12 months    Recent Outpatient Visits           2 weeks ago Annual physical exam   [place] [facility] at [facility], Nyoka Cowden, MD   6 months ago Essential hypertension   [place] [facility] at [facility], Nyoka Cowden, MD   1 year ago Annual physical exam   [facility] at [facility], Nyoka Cowden, MD   1 year ago Essential hypertension   [place] [facility] at [facility], Nyoka Cowden, MD   1 year ago Essential hypertension   [place] [facility] at [facility], Nyoka Cowden, MD       Future Appointments             In 5 months Reubin Milan, MD [facility] at [facility]             cloNIDine (CATAPRES) 0.2 MG tablet 180 tablet 1    Sig: Take 1 tablet (0.2 mg total) by mouth 2 (two) times daily.     Cardiovascular:  Alpha-2 Agonists Passed - 07/16/2022  1:29 PM      Passed - Last BP in normal range    BP Readings from Last 1 Encounters:  07/02/22 128/76         Passed - Last Heart Rate in normal range    Pulse Readings from Last 1 Encounters:   07/02/22 (!) 54         Passed - Valid encounter within last 6 months    Recent Outpatient Visits           2 weeks ago Annual physical exam   [facility] at [facility], Nyoka Cowden, MD   6 months ago Essential hypertension   [place] [facility] at [facility], Nyoka Cowden, MD   1 year ago Annual physical exam   [facility] at [facility], Nyoka Cowden, MD   1 year ago Essential hypertension   [place] [facility] at [facility], Nyoka Cowden, MD   1 year ago Essential hypertension   [place] [facility] at [facility], Nyoka Cowden, MD       Future Appointments  In 5 months Reubin Milan, MD [facility] at [facility]             metFORMIN (GLUCOPHAGE) 500 MG tablet 180 tablet 1    Sig: TAKE 1 TABLET TWICE DAILY  WITH MEALS **ZYDUS MFR**     Endocrinology:  Diabetes - Biguanides Failed - 07/16/2022  1:29 PM      Failed - Cr in normal range and within 360 days    Creatinine, Ser  Date Value Ref Range Status  07/02/2022 1.07 (H) 0.57 - 1.00 mg/dL Final         Failed - eGFR in normal range and within 360 days    GFR calc Af Amer  Date Value Ref Range Status  12/27/2019 62 >59 mL/min/1.73 Final    Comment:    **[facility] currently reports eGFR in compliance with the current**   recommendations of the [employer]. [facility] will   update reporting as new guidelines are published from the NKF-ASN   Task force.    GFR, Estimated  Date Value Ref Range Status  07/15/2020 60 (L) >60 mL/min Final    Comment:    (NOTE) Calculated using the CKD-EPI Creatinine Equation (2021)    eGFR  Date Value Ref Range Status  07/02/2022 51 (L) >59 mL/min/1.73 Final         Failed - B12 Level in normal range and within 720 days    No  results found for: "VITAMINB12"       Passed - HBA1C is between 0 and 7.9 and within 180 days    Hemoglobin A1C  Date Value Ref Range Status  03/10/2016 7.0  Final   Hgb A1c MFr Bld  Date Value Ref Range Status  07/02/2022 6.3 (H) 4.8 - 5.6 % Final    Comment:             Prediabetes: 5.7 - 6.4          Diabetes: >6.4          Glycemic control for adults with diabetes: <7.0          Passed - Valid encounter within last 6 months    Recent Outpatient Visits           2 weeks ago Annual physical exam   [place] [facility] at [facility], Nyoka Cowden, MD   6 months ago Essential hypertension   [place] [facility] at [facility], Nyoka Cowden, MD   1 year ago Annual physical exam   [facility] at [facility], Nyoka Cowden, MD   1 year ago Essential hypertension   [place] [facility] at [facility], Nyoka Cowden, MD   1 year ago Essential hypertension   [place] [facility] at [facility], Nyoka Cowden, MD       Future Appointments             In 5 months Judithann Graves Nyoka Cowden, MD [facility] Care & Sports Medicine at [facility], [facility]            Passed - CBC within normal limits and completed in the last 12 months    WBC  Date Value Ref Range Status  07/02/2022 5.2 3.4 - 10.8 x10E3/uL Final  07/15/2020 18.0 (H) 4.0 - 10.5 K/uL Final  RBC  Date Value Ref Range Status  07/02/2022 3.63 (L) 3.77 - 5.28 x10E6/uL Final  07/15/2020 3.48 (L) 3.87 - 5.11 MIL/uL Final   Hemoglobin  Date Value Ref Range Status  07/02/2022 10.8 (L) 11.1 - 15.9 g/dL Final   Hematocrit  Date Value Ref Range Status  07/02/2022 33.9 (L) 34.0 - 46.6 % Final   MCHC  Date Value Ref Range Status  07/02/2022 31.9 31.5 - 35.7 g/dL Final  16/12/9602 54.0 30.0 - 36.0 g/dL Final   [facility]  Date Value Ref Range Status   07/02/2022 29.8 26.6 - 33.0 pg Final  07/15/2020 29.9 26.0 - 34.0 pg Final   MCV  Date Value Ref Range Status  07/02/2022 93 79 - 97 fL Final   No results found for: "PLTCOUNTKUC", "LABPLAT", "POCPLA" RDW  Date Value Ref Range Status  07/02/2022 13.7 11.7 - 15.4 % Final          metoprolol succinate (TOPROL-XL) 100 MG 24 hr tablet 90 tablet 1    Sig: TAKE 1 TABLET DAILY WITH ORIMMEDIATELY FOLLOWING A    MEAL     Cardiovascular:  Beta Blockers Passed - 07/16/2022  1:29 PM      Passed - Last BP in normal range    BP Readings from Last 1 Encounters:  07/02/22 128/76         Passed - Last Heart Rate in normal range    Pulse Readings from Last 1 Encounters:  07/02/22 (!) 54         Passed - Valid encounter within last 6 months    Recent Outpatient Visits           2 weeks ago Annual physical exam   [place] [facility] at [facility], Nyoka Cowden, MD   6 months ago Essential hypertension   [place] [facility] at [facility], Nyoka Cowden, MD   1 year ago Annual physical exam   [facility] at [facility], Nyoka Cowden, MD   1 year ago Essential hypertension   [place] [facility] at [facility], Nyoka Cowden, MD   1 year ago Essential hypertension   [place] [facility] at [facility], Nyoka Cowden, MD       Future Appointments             In 5 months Reubin Milan, MD [facility] Primary Care & Sports Medicine at [facility], [facility]             doxazosin (CARDURA) 4 MG tablet 90 tablet 1    Sig: Take 1 tablet (4 mg total) by mouth daily.     Cardiovascular:  Alpha Blockers Passed - 07/16/2022  1:29 PM      Passed - Last BP in normal range    BP Readings from Last 1 Encounters:  07/02/22 128/76         Passed - Valid encounter within last 6 months    Recent Outpatient Visits            2 weeks ago Annual physical exam   [facility] at [facility], Nyoka Cowden, MD   6 months ago Essential hypertension   [place] [facility] at [facility], Nyoka Cowden, MD   1 year ago Annual physical exam  [facility] at [facility], Nyoka Cowden, MD   1 year ago Essential hypertension   [place] [facility] at [facility], Nyoka Cowden, MD   1 year ago Essential hypertension   [place] [facility] at [facility], Nyoka Cowden, MD       Future Appointments             In 5 months Reubin Milan, MD [facility] at [facility]             telmisartan (MICARDIS) 80 MG tablet 90 tablet 1    Sig: Take 1 tablet (80 mg total) by mouth daily.     Cardiovascular:  Angiotensin Receptor Blockers Failed - 07/16/2022  1:29 PM      Failed - Cr in normal range and within 180 days    Creatinine, Ser  Date Value Ref Range Status  07/02/2022 1.07 (H) 0.57 - 1.00 mg/dL Final         Passed - K in normal range and within 180 days    Potassium  Date Value Ref Range Status  07/02/2022 4.4 3.5 - 5.2 mmol/L Final         Passed - Patient is not pregnant      Passed - Last BP in normal range    BP Readings from Last 1 Encounters:  07/02/22 128/76         Passed - Valid encounter within last 6 months    Recent Outpatient Visits           2 weeks ago Annual physical exam   [place] [facility] at [facility], Nyoka Cowden, MD   6 months ago Essential hypertension   [place] [facility] at [facility], Nyoka Cowden, MD   1 year ago Annual physical exam   [facility] at [facility], Nyoka Cowden, MD   1 year ago Essential hypertension   [place] [facility] at [facility], Nyoka Cowden, MD   1 year ago Essential hypertension   [place] [facility] at [facility], Nyoka Cowden, MD       Future Appointments             In 5 months Judithann Graves, Nyoka Cowden, MD [facility] Care & [facility] Medicine at [facility], [facility]

## 2022-12-19 ENCOUNTER — Other Ambulatory Visit: Payer: Self-pay | Admitting: Internal Medicine

## 2022-12-19 DIAGNOSIS — I10 Essential (primary) hypertension: Secondary | ICD-10-CM

## 2023-01-04 ENCOUNTER — Ambulatory Visit: Payer: Medicare HMO | Admitting: Internal Medicine

## 2023-01-20 ENCOUNTER — Ambulatory Visit (INDEPENDENT_AMBULATORY_CARE_PROVIDER_SITE_OTHER): Payer: Medicare HMO | Admitting: Internal Medicine

## 2023-01-20 ENCOUNTER — Encounter: Payer: Self-pay | Admitting: Internal Medicine

## 2023-01-20 VITALS — BP 140/85 | HR 62 | Ht 64.0 in | Wt 141.0 lb

## 2023-01-20 DIAGNOSIS — Z7984 Long term (current) use of oral hypoglycemic drugs: Secondary | ICD-10-CM | POA: Diagnosis not present

## 2023-01-20 DIAGNOSIS — E118 Type 2 diabetes mellitus with unspecified complications: Secondary | ICD-10-CM | POA: Diagnosis not present

## 2023-01-20 DIAGNOSIS — I10 Essential (primary) hypertension: Secondary | ICD-10-CM | POA: Diagnosis not present

## 2023-01-20 NOTE — Assessment & Plan Note (Addendum)
Blood sugars stable without hypoglycemic symptoms or events. Current regimen is metformin.  BS range 88-140 Changes made last visit are none. Lab Results  Component Value Date   HGBA1C 6.3 (H) 07/02/2022

## 2023-01-20 NOTE — Progress Notes (Signed)
Date:  01/20/2023   Name:  Amber Padilla   DOB:  30-Sep-1935   MRN:  [id number]   Chief Complaint: Hypertension and Diabetes  Hypertension This is a chronic problem. The problem is controlled. Pertinent negatives include no chest pain, headaches, palpitations or shortness of breath. Past treatments include angiotensin blockers and beta blockers.  Diabetes She presents for her follow-up diabetic visit. She has type 2 diabetes mellitus. Her disease course has been stable. Pertinent negatives for hypoglycemia include no headaches or tremors. Pertinent negatives for diabetes include no chest pain, no fatigue, no polydipsia and no polyuria.    Review of Systems  Constitutional:  Negative for appetite change, fatigue, fever and unexpected weight change.  HENT:  Negative for tinnitus and trouble swallowing.   Eyes:  Negative for visual disturbance.  Respiratory:  Negative for cough, chest tightness and shortness of breath.   Cardiovascular:  Negative for chest pain, palpitations and leg swelling.  Gastrointestinal:  Negative for abdominal pain.  Endocrine: Negative for polydipsia and polyuria.  Genitourinary:  Negative for dysuria and hematuria.  Musculoskeletal:  Negative for arthralgias.  Neurological:  Negative for tremors, numbness and headaches.  Psychiatric/Behavioral:  Negative for dysphoric mood.      Lab Results  Component Value Date   NA 141 07/02/2022   K 4.4 07/02/2022   CO2 26 07/02/2022   GLUCOSE 108 (H) 07/02/2022   BUN 18 07/02/2022   CREATININE 1.07 (H) 07/02/2022   CALCIUM 9.5 07/02/2022   EGFR 51 (L) 07/02/2022   GFRNONAA 60 (L) 07/15/2020   Lab Results  Component Value Date   CHOL 153 07/02/2022   HDL 60 07/02/2022   LDLCALC 78 07/02/2022   TRIG 75 07/02/2022   CHOLHDL 2.6 07/02/2022   Lab Results  Component Value Date   TSH 3.460 07/02/2022   Lab Results  Component Value Date   HGBA1C 6.3 (H) 07/02/2022   Lab Results  Component Value Date    WBC 5.2 07/02/2022   HGB 10.8 (L) 07/02/2022   HCT 33.9 (L) 07/02/2022   MCV 93 07/02/2022   PLT 171 07/02/2022   Lab Results  Component Value Date   ALT 7 07/02/2022   AST 15 07/02/2022   ALKPHOS 60 07/02/2022   BILITOT 0.7 07/02/2022   No results found for: "25OHVITD2", "25OHVITD3", "VD25OH"   Patient Active Problem List   Diagnosis Date Noted   Proteinuria due to type 2 diabetes mellitus (HCC) 12/31/2021   Osteopenia determined by x-ray 07/04/2021   Horseshoe kidney with renal calculus 07/25/2020   Aortic atherosclerosis (HCC) 07/25/2020   Type II diabetes mellitus with complication (HCC) 12/28/2018   Essential hypertension 12/28/2018   Hyperlipidemia associated with type 2 diabetes mellitus (HCC) 12/28/2018    Allergies  Allergen Reactions   Norvasc [Amlodipine] Swelling    Past Surgical History:  Procedure Laterality Date   BUNIONECTOMY Bilateral 2005    Social History   Tobacco Use   Smoking status: Never   Smokeless tobacco: Never  Vaping Use   Vaping status: Never Used  Substance Use Topics   Alcohol use: Never   Drug use: Never     Medication list has been reviewed and updated.  Current Meds  Medication Sig   acetaminophen (TYLENOL) 500 MG tablet Take 1,000 mg by mouth every 6 (six) hours as needed.   aspirin EC 81 MG tablet Take 81 mg by mouth daily.   atorvastatin (LIPITOR) 10 MG tablet Take 1 tablet (10 mg total)  by mouth daily.   blood glucose meter kit and supplies Dispense based on patient and insurance preference. Use up to four times daily as directed. (FOR ICD-10 E10.9, E11.9).   CALCIUM PO Take 600 mg by mouth.   calcium-vitamin D (OSCAL WITH D) 500-200 MG-UNIT tablet Take 1 tablet by mouth.   Cholecalciferol (VITAMIN D3 PO) Take 1,000 Units by mouth.   cloNIDine (CATAPRES) 0.2 MG tablet TAKE 1 TABLET TWICE A DAY   doxazosin (CARDURA) 4 MG tablet TAKE 1 TABLET DAILY   ferrous sulfate 324 MG TBEC Take 324 mg by mouth daily.   Lancets  MISC 1 each by Does not apply route daily at 6 (six) AM.   Magnesium 500 MG CAPS Take by mouth.   metFORMIN (GLUCOPHAGE) 500 MG tablet TAKE 1 TABLET TWICE DAILY  WITH MEALS **ZYDUS MFR**   metoprolol succinate (TOPROL-XL) 100 MG 24 hr tablet TAKE 1 TABLET DAILY WITH ORIMMEDIATELY FOLLOWING A    MEAL   ONETOUCH VERIO test strip Use up to 4 times daily as directed E10.9, E11.9   telmisartan (MICARDIS) 80 MG tablet TAKE 1 TABLET DAILY   vitamin B-12 (CYANOCOBALAMIN) 500 MCG tablet Take 500 mcg by mouth daily.       01/20/2023   10:26 AM 12/31/2021   10:18 AM 07/11/2021    9:22 AM 02/25/2021    9:46 AM  GAD 7 : Generalized Anxiety Score  Nervous, Anxious, on Edge 0 0 0 0  Control/stop worrying 0 0 0 0  Worry too much - different things 0 0 0 0  Trouble relaxing 0 0 0 0  Restless 0 0 0 0  Easily annoyed or irritable 0 0 0 0  Afraid - awful might happen 0 0 0 0  Total GAD 7 Score 0 0 0 0  Anxiety Difficulty Not difficult at all Not difficult at all  Not difficult at all       01/20/2023   10:26 AM 07/02/2022   10:58 AM 07/02/2022   10:57 AM  Depression screen PHQ 2/9  Decreased Interest 0 0 0  Down, Depressed, Hopeless 0 0 0  PHQ - 2 Score 0 0 0  Altered sleeping 0    Tired, decreased energy 0    Change in appetite 0    Feeling bad or failure about yourself  0    Trouble concentrating 0    Moving slowly or fidgety/restless 0    Suicidal thoughts 0    PHQ-9 Score 0    Difficult doing work/chores Not difficult at all      BP Readings from Last 3 Encounters:  01/20/23 (!) 140/85  07/02/22 128/76  12/31/21 118/76    Physical Exam Vitals and nursing note reviewed.  Constitutional:      General: She is not in acute distress.    Appearance: Normal appearance. She is well-developed.  HENT:     Head: Normocephalic and atraumatic.  Neck:     Vascular: No carotid bruit.  Cardiovascular:     Rate and Rhythm: Normal rate and regular rhythm.     Heart sounds: No murmur  heard. Pulmonary:     Effort: Pulmonary effort is normal. No respiratory distress.     Breath sounds: No wheezing or rhonchi.  Musculoskeletal:     Right lower leg: No edema.     Left lower leg: No edema.  Lymphadenopathy:     Cervical: No cervical adenopathy.  Skin:    General: Skin is warm and  dry.     Findings: No rash.  Neurological:     General: No focal deficit present.     Mental Status: She is alert and oriented to person, place, and time.  Psychiatric:        Mood and Affect: Mood normal.        Behavior: Behavior normal.     Wt Readings from Last 3 Encounters:  01/20/23 141 lb (64 kg)  07/02/22 137 lb (62.1 kg)  07/02/22 137 lb (62.1 kg)    BP (!) 140/85 (BP Location: Left Arm, Cuff Size: Normal)   Pulse 62   Ht 5\' 4"  (1.626 m)   Wt 141 lb (64 kg)   SpO2 96%   BMI 24.20 kg/m   Assessment and Plan:  Problem List Items Addressed This Visit       Unprioritized   Essential hypertension - Primary (Chronic)    Normal exam with stable BP on telmisartan and metoprolol. No concerns or side effects to current medication. BP slightly elevated today due to family stresses No change in regimen; continue low sodium diet.  Monitor for worsening.       Relevant Orders   Basic metabolic panel   CBC with Differential/Platelet   Type II diabetes mellitus with complication (HCC)    Blood sugars stable without hypoglycemic symptoms or events. Current regimen is metformin.  BS range 88-140 Changes made last visit are none. Lab Results  Component Value Date   HGBA1C 6.3 (H) 07/02/2022         Relevant Orders   Basic metabolic panel   Hemoglobin A1c   Other Visit Diagnoses     Long term current use of oral hypoglycemic drug           Return in about 4 months (around 05/23/2023) for DM, HTN.    Reubin Milan, MD [facility] Medicine [place]

## 2023-01-20 NOTE — Assessment & Plan Note (Addendum)
Normal exam with stable BP on telmisartan and metoprolol. No concerns or side effects to current medication. BP slightly elevated today due to family stresses No change in regimen; continue low sodium diet.  Monitor for worsening.

## 2023-01-21 LAB — CBC WITH DIFFERENTIAL/PLATELET
Basophils Absolute: 0 10*3/uL (ref 0.0–0.2)
Basos: 0 %
EOS (ABSOLUTE): 0.3 10*3/uL (ref 0.0–0.4)
Eos: 5 %
Hematocrit: 34.9 % (ref 34.0–46.6)
Hemoglobin: 11 g/dL — ABNORMAL LOW (ref 11.1–15.9)
Immature Grans (Abs): 0 10*3/uL (ref 0.0–0.1)
Immature Granulocytes: 0 %
Lymphocytes Absolute: 1.7 10*3/uL (ref 0.7–3.1)
Lymphs: 36 %
MCH: 29.8 pg (ref 26.6–33.0)
MCHC: 31.5 g/dL (ref 31.5–35.7)
MCV: 95 fL (ref 79–97)
Monocytes Absolute: 0.5 10*3/uL (ref 0.1–0.9)
Monocytes: 10 %
Neutrophils Absolute: 2.4 10*3/uL (ref 1.4–7.0)
Neutrophils: 49 %
Platelets: 172 10*3/uL (ref 150–450)
RBC: 3.69 x10E6/uL — ABNORMAL LOW (ref 3.77–5.28)
RDW: 13.8 % (ref 11.7–15.4)
WBC: 4.8 10*3/uL (ref 3.4–10.8)

## 2023-01-21 LAB — BASIC METABOLIC PANEL
BUN/Creatinine Ratio: 16 (ref 12–28)
BUN: 18 mg/dL (ref 8–27)
CO2: 25 mmol/L (ref 20–29)
Calcium: 9.7 mg/dL (ref 8.7–10.3)
Chloride: 100 mmol/L (ref 96–106)
Creatinine, Ser: 1.13 mg/dL — ABNORMAL HIGH (ref 0.57–1.00)
Glucose: 119 mg/dL — ABNORMAL HIGH (ref 70–99)
Potassium: 4.5 mmol/L (ref 3.5–5.2)
Sodium: 140 mmol/L (ref 134–144)
eGFR: 48 mL/min/{1.73_m2} — ABNORMAL LOW (ref >59)

## 2023-01-21 LAB — HEMOGLOBIN A1C
Est. average glucose Bld gHb Est-mCnc: 134 mg/dL
Hgb A1c MFr Bld: 6.3 % — ABNORMAL HIGH (ref 4.8–5.6)

## 2023-02-22 ENCOUNTER — Ambulatory Visit: Payer: Medicare HMO | Admitting: Internal Medicine

## 2023-05-09 ENCOUNTER — Inpatient Hospital Stay
Admission: EM | Admit: 2023-05-09 | Discharge: 2023-05-11 | DRG: 065 | Disposition: A | Discharge status: Home Health | Payer: Medicare HMO | Attending: Osteopathic Medicine | Admitting: Osteopathic Medicine

## 2023-05-09 ENCOUNTER — Other Ambulatory Visit: Payer: Self-pay

## 2023-05-09 ENCOUNTER — Observation Stay: Payer: Medicare HMO

## 2023-05-09 ENCOUNTER — Encounter: Payer: Self-pay | Admitting: Emergency Medicine

## 2023-05-09 ENCOUNTER — Emergency Department: Payer: Medicare HMO

## 2023-05-09 DIAGNOSIS — I160 Hypertensive urgency: Secondary | ICD-10-CM | POA: Diagnosis present

## 2023-05-09 DIAGNOSIS — R29700 NIHSS score 0: Secondary | ICD-10-CM | POA: Diagnosis present

## 2023-05-09 DIAGNOSIS — Z885 Allergy status to narcotic agent status: Secondary | ICD-10-CM

## 2023-05-09 DIAGNOSIS — J341 Cyst and mucocele of nose and nasal sinus: Secondary | ICD-10-CM | POA: Diagnosis present

## 2023-05-09 DIAGNOSIS — R4701 Aphasia: Secondary | ICD-10-CM | POA: Diagnosis not present

## 2023-05-09 DIAGNOSIS — I63532 Cerebral infarction due to unspecified occlusion or stenosis of left posterior cerebral artery: Principal | ICD-10-CM | POA: Diagnosis present

## 2023-05-09 DIAGNOSIS — R4702 Dysphasia: Secondary | ICD-10-CM | POA: Diagnosis not present

## 2023-05-09 DIAGNOSIS — Z7982 Long term (current) use of aspirin: Secondary | ICD-10-CM

## 2023-05-09 DIAGNOSIS — D649 Anemia, unspecified: Secondary | ICD-10-CM | POA: Diagnosis present

## 2023-05-09 DIAGNOSIS — I10 Essential (primary) hypertension: Secondary | ICD-10-CM | POA: Diagnosis present

## 2023-05-09 DIAGNOSIS — I169 Hypertensive crisis, unspecified: Secondary | ICD-10-CM

## 2023-05-09 DIAGNOSIS — G43109 Migraine with aura, not intractable, without status migrainosus: Secondary | ICD-10-CM | POA: Diagnosis present

## 2023-05-09 DIAGNOSIS — R29711 NIHSS score 11: Secondary | ICD-10-CM | POA: Diagnosis not present

## 2023-05-09 DIAGNOSIS — R1310 Dysphagia, unspecified: Secondary | ICD-10-CM | POA: Diagnosis present

## 2023-05-09 DIAGNOSIS — Z8673 Personal history of transient ischemic attack (TIA), and cerebral infarction without residual deficits: Secondary | ICD-10-CM | POA: Diagnosis present

## 2023-05-09 DIAGNOSIS — Z888 Allergy status to other drugs, medicaments and biological substances status: Secondary | ICD-10-CM

## 2023-05-09 DIAGNOSIS — R519 Headache, unspecified: Secondary | ICD-10-CM

## 2023-05-09 DIAGNOSIS — R4182 Altered mental status, unspecified: Secondary | ICD-10-CM | POA: Diagnosis not present

## 2023-05-09 DIAGNOSIS — I161 Hypertensive emergency: Secondary | ICD-10-CM | POA: Diagnosis present

## 2023-05-09 DIAGNOSIS — I6930 Unspecified sequelae of cerebral infarction: Secondary | ICD-10-CM | POA: Diagnosis present

## 2023-05-09 DIAGNOSIS — Z7984 Long term (current) use of oral hypoglycemic drugs: Secondary | ICD-10-CM

## 2023-05-09 DIAGNOSIS — E119 Type 2 diabetes mellitus without complications: Secondary | ICD-10-CM | POA: Diagnosis present

## 2023-05-09 DIAGNOSIS — E782 Mixed hyperlipidemia: Secondary | ICD-10-CM

## 2023-05-09 DIAGNOSIS — Z833 Family history of diabetes mellitus: Secondary | ICD-10-CM

## 2023-05-09 DIAGNOSIS — G459 Transient cerebral ischemic attack, unspecified: Secondary | ICD-10-CM | POA: Diagnosis not present

## 2023-05-09 DIAGNOSIS — I639 Cerebral infarction, unspecified: Secondary | ICD-10-CM | POA: Diagnosis present

## 2023-05-09 DIAGNOSIS — R471 Dysarthria and anarthria: Secondary | ICD-10-CM | POA: Diagnosis present

## 2023-05-09 DIAGNOSIS — R29708 NIHSS score 8: Secondary | ICD-10-CM | POA: Diagnosis not present

## 2023-05-09 DIAGNOSIS — R29702 NIHSS score 2: Secondary | ICD-10-CM | POA: Diagnosis not present

## 2023-05-09 DIAGNOSIS — R29707 NIHSS score 7: Secondary | ICD-10-CM | POA: Diagnosis not present

## 2023-05-09 DIAGNOSIS — E039 Hypothyroidism, unspecified: Secondary | ICD-10-CM | POA: Diagnosis present

## 2023-05-09 DIAGNOSIS — E785 Hyperlipidemia, unspecified: Secondary | ICD-10-CM | POA: Diagnosis present

## 2023-05-09 DIAGNOSIS — D696 Thrombocytopenia, unspecified: Secondary | ICD-10-CM | POA: Diagnosis present

## 2023-05-09 DIAGNOSIS — Z79899 Other long term (current) drug therapy: Secondary | ICD-10-CM

## 2023-05-09 LAB — TROPONIN I (HIGH SENSITIVITY): Troponin I (High Sensitivity): 16 ng/L (ref <18)

## 2023-05-09 LAB — CBC
HCT: 32.3 % — ABNORMAL LOW (ref 36.0–46.0)
HCT: 35.7 % — ABNORMAL LOW (ref 36.0–46.0)
Hemoglobin: 10.5 g/dL — ABNORMAL LOW (ref 12.0–15.0)
Hemoglobin: 11.4 g/dL — ABNORMAL LOW (ref 12.0–15.0)
MCH: 30.1 pg (ref 26.0–34.0)
MCH: 31 pg (ref 26.0–34.0)
MCHC: 31.9 g/dL (ref 30.0–36.0)
MCHC: 32.5 g/dL (ref 30.0–36.0)
MCV: 94.2 fL (ref 80.0–100.0)
MCV: 95.3 fL (ref 80.0–100.0)
Platelets: 143 10*3/uL — ABNORMAL LOW (ref 150–400)
Platelets: 145 10*3/uL — ABNORMAL LOW (ref 150–400)
RBC: 3.39 MIL/uL — ABNORMAL LOW (ref 3.87–5.11)
RBC: 3.79 MIL/uL — ABNORMAL LOW (ref 3.87–5.11)
RDW: 14.7 % (ref 11.5–15.5)
RDW: 14.7 % (ref 11.5–15.5)
WBC: 5.9 10*3/uL (ref 4.0–10.5)
WBC: 6.6 10*3/uL (ref 4.0–10.5)
nRBC: 0 % (ref 0.0–0.2)
nRBC: 0 % (ref 0.0–0.2)

## 2023-05-09 LAB — COMPREHENSIVE METABOLIC PANEL
ALT: 8 U/L (ref 0–44)
AST: 18 U/L (ref 15–41)
Albumin: 3.9 g/dL (ref 3.5–5.0)
Alkaline Phosphatase: 47 U/L (ref 38–126)
Anion gap: 13 (ref 5–15)
BUN: 22 mg/dL (ref 8–23)
CO2: 26 mmol/L (ref 22–32)
Calcium: 9.2 mg/dL (ref 8.9–10.3)
Chloride: 99 mmol/L (ref 98–111)
Creatinine, Ser: 1.19 mg/dL — ABNORMAL HIGH (ref 0.44–1.00)
GFR, Estimated: 45 mL/min — ABNORMAL LOW (ref >60)
Glucose, Bld: 102 mg/dL — ABNORMAL HIGH (ref 70–99)
Potassium: 3.6 mmol/L (ref 3.5–5.1)
Sodium: 138 mmol/L (ref 135–145)
Total Bilirubin: 1 mg/dL (ref 0.0–1.2)
Total Protein: 7.9 g/dL (ref 6.5–8.1)

## 2023-05-09 LAB — BASIC METABOLIC PANEL
Anion gap: 15 (ref 5–15)
BUN: 19 mg/dL (ref 8–23)
CO2: 21 mmol/L — ABNORMAL LOW (ref 22–32)
Calcium: 9.2 mg/dL (ref 8.9–10.3)
Chloride: 102 mmol/L (ref 98–111)
Creatinine, Ser: 1.18 mg/dL — ABNORMAL HIGH (ref 0.44–1.00)
GFR, Estimated: 45 mL/min — ABNORMAL LOW (ref >60)
Glucose, Bld: 96 mg/dL (ref 70–99)
Potassium: 3.6 mmol/L (ref 3.5–5.1)
Sodium: 138 mmol/L (ref 135–145)

## 2023-05-09 LAB — LIPID PANEL
Cholesterol: 238 mg/dL — ABNORMAL HIGH (ref 0–200)
HDL: 59 mg/dL (ref >40)
LDL Cholesterol: 167 mg/dL — ABNORMAL HIGH (ref 0–99)
Total CHOL/HDL Ratio: 4 {ratio}
Triglycerides: 61 mg/dL (ref <150)
VLDL: 12 mg/dL (ref 0–40)

## 2023-05-09 LAB — DIFFERENTIAL
Abs Immature Granulocytes: 0.01 10*3/uL (ref 0.00–0.07)
Basophils Absolute: 0 10*3/uL (ref 0.0–0.1)
Basophils Relative: 1 %
Eosinophils Absolute: 0.2 10*3/uL (ref 0.0–0.5)
Eosinophils Relative: 3 %
Immature Granulocytes: 0 %
Lymphocytes Relative: 35 %
Lymphs Abs: 2.1 10*3/uL (ref 0.7–4.0)
Monocytes Absolute: 0.5 10*3/uL (ref 0.1–1.0)
Monocytes Relative: 9 %
Neutro Abs: 3 10*3/uL (ref 1.7–7.7)
Neutrophils Relative %: 52 %

## 2023-05-09 LAB — URINALYSIS, COMPLETE (UACMP) WITH MICROSCOPIC
Bacteria, UA: NONE SEEN
Bilirubin Urine: NEGATIVE
Glucose, UA: 50 mg/dL — AB
Hgb urine dipstick: NEGATIVE
Ketones, ur: 5 mg/dL — AB
Leukocytes,Ua: NEGATIVE
Nitrite: NEGATIVE
Protein, ur: NEGATIVE mg/dL
Specific Gravity, Urine: 1.041 — ABNORMAL HIGH (ref 1.005–1.030)
pH: 5 (ref 5.0–8.0)

## 2023-05-09 LAB — AMMONIA: Ammonia: 36 umol/L — ABNORMAL HIGH (ref 9–35)

## 2023-05-09 LAB — ETHANOL: Alcohol, Ethyl (B): <10 mg/dL (ref <10)

## 2023-05-09 LAB — CBG MONITORING, ED: Glucose-Capillary: 99 mg/dL (ref 70–99)

## 2023-05-09 LAB — PROTIME-INR
INR: 1.1 (ref 0.8–1.2)
Prothrombin Time: 14.6 s (ref 11.4–15.2)

## 2023-05-09 LAB — TSH: TSH: 1.316 u[IU]/mL (ref 0.350–4.500)

## 2023-05-09 LAB — APTT: aPTT: 31 s (ref 24–36)

## 2023-05-09 LAB — GLUCOSE, CAPILLARY: Glucose-Capillary: 96 mg/dL (ref 70–99)

## 2023-05-09 MED ORDER — CLONIDINE HCL 0.1 MG PO TABS
0.2000 mg | ORAL_TABLET | Freq: Once | ORAL | Status: DC
  Filled 2023-05-09: qty 2

## 2023-05-09 MED ORDER — ONDANSETRON HCL 4 MG PO TABS: 4.0000 mg | ORAL_TABLET | Freq: Four times a day (QID) | ORAL | Status: DC | PRN

## 2023-05-09 MED ORDER — ACETAMINOPHEN 500 MG PO TABS
1000.0000 mg | ORAL_TABLET | Freq: Once | ORAL | Status: DC
  Filled 2023-05-09: qty 2

## 2023-05-09 MED ORDER — CLONIDINE HCL 0.1 MG PO TABS: 0.2000 mg | ORAL_TABLET | Freq: Two times a day (BID) | ORAL | Status: DC

## 2023-05-09 MED ORDER — ONDANSETRON HCL 4 MG/2ML IJ SOLN
4.0000 mg | Freq: Four times a day (QID) | INTRAMUSCULAR | Status: DC | PRN
  Administered 2023-05-09: 4 mg via INTRAVENOUS
  Filled 2023-05-09: qty 2

## 2023-05-09 MED ORDER — HYDRALAZINE HCL 20 MG/ML IJ SOLN
10.0000 mg | Freq: Once | INTRAMUSCULAR | Status: AC
  Administered 2023-05-09: 10 mg via INTRAVENOUS
  Filled 2023-05-09: qty 1

## 2023-05-09 MED ORDER — HYDRALAZINE HCL 20 MG/ML IJ SOLN
10.0000 mg | Freq: Four times a day (QID) | INTRAMUSCULAR | Status: DC | PRN
  Administered 2023-05-09 – 2023-05-11 (×2): 10 mg via INTRAVENOUS
  Filled 2023-05-09 (×2): qty 1

## 2023-05-09 MED ORDER — ACETAMINOPHEN 325 MG PO TABS: 650.0000 mg | ORAL_TABLET | Freq: Four times a day (QID) | ORAL | Status: DC | PRN

## 2023-05-09 MED ORDER — ACETAMINOPHEN 650 MG RE SUPP: 650.0000 mg | Freq: Four times a day (QID) | RECTAL | Status: DC | PRN

## 2023-05-09 MED ORDER — ASPIRIN 81 MG PO TBEC
81.0000 mg | DELAYED_RELEASE_TABLET | Freq: Every day | ORAL | Status: DC
  Administered 2023-05-09 – 2023-05-11 (×3): 81 mg via ORAL
  Filled 2023-05-09 (×3): qty 1

## 2023-05-09 MED ORDER — NITROGLYCERIN 2 % TD OINT: 1.0000 [in_us] | TOPICAL_OINTMENT | Freq: Four times a day (QID) | TRANSDERMAL | Status: DC | PRN

## 2023-05-09 MED ORDER — TRAZODONE HCL 50 MG PO TABS: 25.0000 mg | ORAL_TABLET | Freq: Every evening | ORAL | Status: DC | PRN

## 2023-05-09 MED ORDER — LABETALOL HCL 5 MG/ML IV SOLN
5.0000 mg | Freq: Once | INTRAVENOUS | Status: AC
  Administered 2023-05-09: 5 mg via INTRAVENOUS
  Filled 2023-05-09: qty 4

## 2023-05-09 MED ORDER — VITAMIN D 25 MCG (1000 UNIT) PO TABS
1000.0000 [IU] | ORAL_TABLET | Freq: Every day | ORAL | Status: DC
  Administered 2023-05-09 – 2023-05-11 (×3): 1000 [IU] via ORAL
  Filled 2023-05-09 (×3): qty 1

## 2023-05-09 MED ORDER — STROKE: EARLY STAGES OF RECOVERY BOOK: Freq: Once | Status: AC

## 2023-05-09 MED ORDER — IRBESARTAN 150 MG PO TABS
75.0000 mg | ORAL_TABLET | Freq: Every day | ORAL | Status: DC
  Administered 2023-05-09 – 2023-05-11 (×3): 75 mg via ORAL
  Filled 2023-05-09 (×3): qty 1

## 2023-05-09 MED ORDER — ENALAPRILAT 1.25 MG/ML IV SOLN
0.6250 mg | Freq: Four times a day (QID) | INTRAVENOUS | Status: DC | PRN
  Administered 2023-05-09: 0.625 mg via INTRAVENOUS
  Filled 2023-05-09 (×2): qty 0.5

## 2023-05-09 MED ORDER — SODIUM CHLORIDE 0.9 % IV SOLN: INTRAVENOUS | Status: AC

## 2023-05-09 MED ORDER — CYANOCOBALAMIN 500 MCG PO TABS
500.0000 ug | ORAL_TABLET | Freq: Every day | ORAL | Status: DC
  Administered 2023-05-09 – 2023-05-11 (×3): 500 ug via ORAL
  Filled 2023-05-09 (×3): qty 1

## 2023-05-09 MED ORDER — OYSTER SHELL CALCIUM/D3 500-5 MG-MCG PO TABS
1.0000 | ORAL_TABLET | Freq: Every day | ORAL | Status: DC
  Administered 2023-05-09 – 2023-05-11 (×3): 1 via ORAL
  Filled 2023-05-09 (×3): qty 1

## 2023-05-09 MED ORDER — ATORVASTATIN CALCIUM 20 MG PO TABS
10.0000 mg | ORAL_TABLET | Freq: Every day | ORAL | Status: DC
  Administered 2023-05-09 – 2023-05-11 (×3): 10 mg via ORAL
  Filled 2023-05-09 (×3): qty 1

## 2023-05-09 MED ORDER — MAGNESIUM HYDROXIDE 400 MG/5ML PO SUSP: 30.0000 mL | Freq: Every day | ORAL | Status: DC | PRN

## 2023-05-09 MED ORDER — VALPROATE SODIUM 100 MG/ML IV SOLN
1500.0000 mg | Freq: Once | INTRAVENOUS | Status: AC
  Administered 2023-05-09: 1500 mg via INTRAVENOUS
  Filled 2023-05-09: qty 15

## 2023-05-09 MED ORDER — ENOXAPARIN SODIUM 30 MG/0.3ML IJ SOSY
30.0000 mg | PREFILLED_SYRINGE | INTRAMUSCULAR | Status: DC
  Administered 2023-05-09 – 2023-05-11 (×3): 30 mg via SUBCUTANEOUS
  Filled 2023-05-09 (×3): qty 0.3

## 2023-05-09 MED ORDER — METOCLOPRAMIDE HCL 5 MG/ML IJ SOLN
10.0000 mg | Freq: Once | INTRAMUSCULAR | Status: AC
  Administered 2023-05-09: 10 mg via INTRAVENOUS
  Filled 2023-05-09: qty 2

## 2023-05-09 MED ORDER — DOXAZOSIN MESYLATE 4 MG PO TABS
4.0000 mg | ORAL_TABLET | Freq: Every day | ORAL | Status: DC
  Administered 2023-05-09 – 2023-05-11 (×3): 4 mg via ORAL
  Filled 2023-05-09 (×3): qty 1

## 2023-05-09 MED ORDER — METOPROLOL SUCCINATE ER 50 MG PO TB24
100.0000 mg | ORAL_TABLET | Freq: Every day | ORAL | Status: DC
  Administered 2023-05-09 – 2023-05-11 (×3): 100 mg via ORAL
  Filled 2023-05-09 (×3): qty 2

## 2023-05-09 MED ORDER — CLONIDINE HCL 0.1 MG PO TABS
0.2000 mg | ORAL_TABLET | Freq: Three times a day (TID) | ORAL | Status: DC
  Administered 2023-05-09 – 2023-05-11 (×6): 0.2 mg via ORAL
  Filled 2023-05-09 (×6): qty 2

## 2023-05-09 MED ORDER — FERROUS SULFATE 325 (65 FE) MG PO TABS
325.0000 mg | ORAL_TABLET | Freq: Every day | ORAL | Status: DC
  Administered 2023-05-09 – 2023-05-11 (×3): 325 mg via ORAL
  Filled 2023-05-09 (×3): qty 1

## 2023-05-09 MED ORDER — IOHEXOL 350 MG/ML SOLN
75.0000 mL | Freq: Once | INTRAVENOUS | Status: AC | PRN
Start: 2023-05-09 — End: 2023-05-09
  Administered 2023-05-09: 75 mL via INTRAVENOUS

## 2023-05-09 MED ORDER — CLOPIDOGREL BISULFATE 75 MG PO TABS
75.0000 mg | ORAL_TABLET | Freq: Every day | ORAL | Status: DC
  Administered 2023-05-09 – 2023-05-11 (×3): 75 mg via ORAL
  Filled 2023-05-09 (×3): qty 1

## 2023-05-09 NOTE — Progress Notes (Signed)
 PHARMACIST - PHYSICIAN COMMUNICATION  CONCERNING:  Enoxaparin  (Lovenox ) for DVT Prophylaxis    RECOMMENDATION: Patient was prescribed enoxaprin 40mg  q24 hours for VTE prophylaxis.   Filed Weights   05/09/23 0156  Weight: 63.1 kg (139 lb 1.8 oz)    Body mass index is 23.88 kg/m.  Estimated Creatinine Clearance: 29.8 mL/min (A) (by C-G formula based on SCr of 1.19 mg/dL (H)).  Patient is candidate for enoxaparin  30mg  every 24 hours based on CrCl <11ml/min or Weight <45kg  DESCRIPTION: Pharmacy has adjusted enoxaparin  dose per [facility] policy.  Patient is now receiving enoxaparin  30 mg every 24 hours   [place] CANDIE Dills, PharmD, [facility] 05/09/2023 2:14 AM

## 2023-05-09 NOTE — Evaluation (Addendum)
 Physical Therapy Evaluation Patient Details Name: Amber Padilla MRN: [id number] DOB: 1935-08-14 Today's Date: 05/09/2023  History of Present Illness  Amber Padilla is an 87yoF who comes to [facility] ED with acute language changes noted per DTR. PMH: DM2, THN, hypoTSH. Symptoms resolved by time of admission and MRI no acute CVA however alter in the morning expressive aphasia recurred, as well as coordination difficulty w/ upper extremities, describing L frontal/sinus headache. History taken from sister Ida at eval. At baseline pt has no significant deficits, lives alone, manages he rown finanaces, does not drive, no use of equip needed for mobility, no recent falls.  Clinical Impression  Pt in bed on entry, awake, Sister Lindajo and 2 non family visitors at bedside. Pt remains very altered per family report, obvious language deficits, but can follow verbal and tactile/visual cues well enough to partake in assessment of basic mobility. Affect is also somewhat indicate of reduced situational awareness. Pt demonstrates bed mobility, transfers, and AMB with hand held assist or less, but no frank physical assistance needs and no LOB in session. Will continue to follow. Pt currently could meet her rehab needs in a variety of settings, but has more supervision needs at present, which Sister Ida says could likely be met with a combination of several nearby family members.       If plan is discharge home, recommend the following: A little help with walking and/or transfers;Assist for transportation;Assistance with cooking/housework;Help with stairs or ramp for entrance   Can travel by private vehicle        Equipment Recommendations None recommended by PT  Recommendations for Other Services       Functional Status Assessment Patient has had a recent decline in their functional status and demonstrates the ability to make significant improvements in function in a reasonable and predictable amount of time.      Precautions / Restrictions Precautions Precautions: Fall Restrictions Weight Bearing Restrictions Per Provider Order: No      Mobility  Bed Mobility Overal bed mobility: Needs Assistance Bed Mobility: Supine to Sit, Sit to Supine     Supine to sit: Supervision Sit to supine: Supervision        Transfers Overall transfer level: Needs assistance Equipment used: None Transfers: Sit to/from Stand Sit to Stand: Contact guard assist, Min assist           General transfer comment: effort required, multiple attempted to succeed.    Ambulation/Gait   Gait Distance (Feet): 30 Feet Assistive device: 1 person hand held assist, None Gait Pattern/deviations: Step-to pattern       General Gait Details: visual cues for AMB toward BR door and back, no frank LOB but pt reaching for items and or using authros hand to safely come back to EOB.  Stairs            Wheelchair Mobility     Tilt Bed    Modified Rankin (Stroke Patients Only)       Balance                                             Pertinent Vitals/Pain Pain Assessment Pain Assessment: PAINAD Breathing: normal Negative Vocalization: none Facial Expression: smiling or inexpressive Body Language: relaxed Consolability: no need to console PAINAD Score: 0 Pain Intervention(s): Limited activity within patient's tolerance, Monitored during session    Home Living Family/patient  expects to be discharged to:: Private residence Living Arrangements: Alone Available Help at Discharge: Family Type of Home: House Home Access: Level entry       Home Layout: One level Home Equipment: None      Prior Function Prior Level of Function : Independent/Modified Independent                     Extremity/Trunk Assessment                Communication      Cognition Arousal: Alert Behavior During Therapy:  (calm, has clear receptive language impairment, limited but less  oobvious expressive language difficulty.  Pt follows a combination of verbal and gestural cues well enough for basic mobility.) Overall Cognitive Status: Impaired/Different from baseline                                          General Comments      Exercises     Assessment/Plan    PT Assessment Patient needs continued PT services  PT Problem List Decreased strength;Decreased activity tolerance;Decreased balance;Decreased mobility;Decreased coordination;Decreased cognition;Decreased knowledge of precautions;Decreased safety awareness       PT Treatment Interventions DME instruction;Gait training;Stair training;Functional mobility training;Therapeutic activities;Therapeutic exercise;Balance training;Neuromuscular re-education;Cognitive remediation;Patient/family education    PT Goals (Current goals can be found in the Care Plan section)  Acute Rehab PT Goals Patient Stated Goal: regain baseline language fluency PT Goal Formulation: With family Time For Goal Achievement: 05/23/23 Potential to Achieve Goals: Fair    Frequency Min 1X/week     Co-evaluation               AM-PAC PT 6 Clicks Mobility  Outcome Measure Help needed turning from your back to your side while in a flat bed without using bedrails?: A Little Help needed moving from lying on your back to sitting on the side of a flat bed without using bedrails?: A Little Help needed moving to and from a bed to a chair (including a wheelchair)?: A Little Help needed standing up from a chair using your arms (e.g., wheelchair or bedside chair)?: A Little Help needed to walk in hospital room?: A Little Help needed climbing 3-5 steps with a railing? : A Lot 6 Click Score: 17    End of Session Equipment Utilized During Treatment: Gait belt Activity Tolerance: Patient tolerated treatment well;No increased pain Patient left: in bed;with call bell/phone within reach;with nursing/sitter in room;with  family/visitor present Nurse Communication: Mobility status PT Visit Diagnosis: Difficulty in walking, not elsewhere classified (R26.2);Unsteadiness on feet (R26.81);Other abnormalities of gait and mobility (R26.89);Muscle weakness (generalized) (M62.81);Other symptoms and signs involving the nervous system (R29.898)    Time: 8674-8651 PT Time Calculation (min) (ACUTE ONLY): 23 min   Charges:   PT Evaluation $PT Eval Moderate Complexity: 1 Mod   PT General Charges $$ ACUTE PT VISIT: 1 Visit    3:14 PM, 05/09/23 Peggye JAYSON Linear, PT, DPT Physical Therapist - [facility]  [phone] (ASCOM)    Ashaunti Treptow C 05/09/2023, 3:10 PM

## 2023-05-09 NOTE — Progress Notes (Signed)
 MEWS Progress Note  Patient Details Name: Amber Padilla MRN: [id number] DOB: November 18, 1935 Today's Date: 05/09/2023   MEWS Flowsheet Documentation:  Assess: MEWS Score Temp: 98.6 F (37 C) BP: (!) 218/85 MAP (mmHg): 122 Pulse Rate: 61 ECG Heart Rate: 63 Resp: 18 Level of Consciousness: Alert SpO2: 99 % O2 Device: Room Air Assess: MEWS Score MEWS Temp: 0 MEWS Systolic: 2 MEWS Pulse: 0 MEWS RR: 0 MEWS LOC: 0 MEWS Score: 2 MEWS Score Color: Yellow Assess: SIRS CRITERIA SIRS Temperature : 0 SIRS Respirations : 0 SIRS Pulse: 0 SIRS WBC: 0 SIRS Score Sum : 0 SIRS Temperature : 0 SIRS Pulse: 0 SIRS Respirations : 0 SIRS WBC: 0 SIRS Score Sum : 0 Assess: if the MEWS score is Yellow or Red Were vital signs accurate and taken at a resting state?: Yes Does the patient meet 2 or more of the SIRS criteria?: No MEWS guidelines implemented : Yes, yellow Treat MEWS Interventions: Considered administering scheduled or prn medications/treatments as ordered Take Vital Signs Increase Vital Sign Frequency : Yellow: Q2hr x1, continue Q4hrs until patient remains green for 12hrs Escalate MEWS: Escalate: Yellow: Discuss with charge nurse and consider notifying provider and/or RRT Provider Notification Provider Name/Title: Lawence MD Date Provider Notified: 05/09/23 Time Provider Notified: [id number]      [name] 05/09/2023, 5:18 AM

## 2023-05-09 NOTE — Progress Notes (Addendum)
 SLP Cancellation Note  Patient Details Name: Amber Padilla MRN: [id number] DOB: 1936/03/08   Cancelled treatment:       Reason Eval/Treat Not Completed: Patient not medically ready (chart reviewed)  BSE completed w/ pt. Aspiration precautions.  HELD on Cog-Lang evaluation this morning as pt was reported to have fluctuating cog-language presentation again earlier this AM -- Neurology is consulted and pending assessment.  Noted pt exhibited intermittent, functional receptive language skills but exhibits expressive language deficits(few automatic outbursts were appropriate, clear speech). Apparent R side neglect. Initial education given to [name] in room . ST services will f/u w/ Cog-Language evaluation tomorrow. NSG and MD updated, agreed.     Comer Portugal, MS, CCC-SLP Speech Language Pathologist Rehab Services; [facility] [phone] (ascom) Marijean Montanye 05/09/2023, 10:16 AM

## 2023-05-09 NOTE — Progress Notes (Signed)
 Code stroke initiated after Dr. Lawence gave thumbs up for call code stroke to get tele neuro involve question.   This was deemed to be miscommunication. Dr. Lawence came to bedside and informed that nothing much has changed neuro wise since presenting to the ED and Neuro has been consulted.   Pt's SBP remains >200 despite giving PRN hydralazine . Provider aware.   Code stroke cancelled.

## 2023-05-09 NOTE — Assessment & Plan Note (Addendum)
-   The patient will be admitted to an observation medically monitored bed. - This manifested by expressive dysphasia that resolved and currently dysphagia with left-sided headache. - We will follow neuro checks q.4 hours for 24 hours.   - The patient will be placed on aspirin .   - Will obtain a brain MRI without contrast as well as Head and neck CTA and 2D echo with bubble study .   - A neurology consultation  as well as physical/occupation/speech therapy consults will be obtained in a.m. - I notified Dr. Michaela about the patient. - The patient will be placed on statin therapy and fasting lipids will be checked.

## 2023-05-09 NOTE — ED Provider Notes (Addendum)
 [facility] Provider Note    Event Date/Time   First MD Initiated Contact with Patient 05/09/23 0016     (approximate)   History   Altered Mental Status and Eye Pain   HPI  Amber Padilla is a 88 y.o. female   Past medical history of diabetes and hypertension presents to the emergency department with aphasia.  She comes with her daughter who gives collateral information.  She was in her regular state of health today and was last seen by her daughter at 5 PM in person behaving normally, no complaints.  They watch TV and conversed as usual.  At around 9 PM the mother started texting her family incoherently, first with a jumble of letters, and then with random words that did not piece together to make any sense.  The daughter went to check on her mother around 65 PM.  She did not answer the door.  She appeared confused.  She had word salad as described by her RN daughter and further elaborated that she was straining worse together that were completely nonsensical but the mother appeared unfazed by her word choice.  This resolved by the time they came to the emergency department.  Patient's only complaint at this time is left-sided headache.  No visual changes.  She denies any trauma.  Independent Historian contributed to assessment above: Daughter gives past medical history, collateral information as above  External Medical Documents Reviewed: October 2024 primary doctor visit as an outpatient reviewed her past medical history management of hypertension and diabetes      Physical Exam   Triage Vital Signs: ED Triage Vitals  Encounter Vitals Group     BP      Systolic BP Percentile      Diastolic BP Percentile      Pulse      Resp      Temp      Temp src      SpO2      Weight      Height      Head Circumference      Peak Flow      Pain Score      Pain Loc      Pain Education      Exclude from Growth Chart     Most recent vital  signs: Vitals:   05/09/23 0022  BP: (!) 253/88  Pulse: 65  Resp: 18  Temp: 98.4 F (36.9 C)  SpO2: 97%    General: Awake, no distress.  CV:  Good peripheral perfusion.  Resp:  Normal effort.  Abd:  No distention.  Other:  Pleasant woman in no acute distress.  She is markedly hypertensive 250s over 80s.  She is answering my questions appropriately.  Extraocular movements intact she has no temporal tenderness her neck is supple with full range of motion.  On neurologic exam she has no dysarthria, she has no facial asymmetry, no motor or sensory deficits.  She has no gaze preference or unilateral neglect.   ED Results / Procedures / Treatments   Labs (all labs ordered are listed, but only abnormal results are displayed) Labs Reviewed  CBC - Abnormal; Notable for the following components:      Result Value   RBC 3.39 (*)    Hemoglobin 10.5 (*)    HCT 32.3 (*)    Platelets 145 (*)    All other components within normal limits  COMPREHENSIVE METABOLIC PANEL - Abnormal; Notable for the  following components:   Glucose, Bld 102 (*)    Creatinine, Ser 1.19 (*)    GFR, Estimated 45 (*)    All other components within normal limits  PROTIME-INR  APTT  DIFFERENTIAL  ETHANOL  CBG MONITORING, ED     I ordered and reviewed the above labs they are notable for glucose 99  EKG  ED ECG REPORT I, Ginnie Shams, the attending physician, personally viewed and interpreted this ECG.   Date: 05/09/2023  EKG Time: 0055  Rate: 52  Rhythm: sinus bradycardia  Axis: nl  Intervals:none  ST&T Change: no stemi    RADIOLOGY I independently reviewed and interpreted CT head and see no obvious bleeding or midline shift I also reviewed radiologist's formal read.   PROCEDURES:  Critical Care performed: No  Procedures   MEDICATIONS ORDERED IN ED: Medications  acetaminophen  (TYLENOL ) tablet 1,000 mg (has no administration in time range)  cloNIDine  (CATAPRES ) tablet 0.2 mg (has no  administration in time range)    External physician / consultants:  I spoke with hospital medicine for admission and regarding care plan for this patient.   IMPRESSION / MDM / ASSESSMENT AND PLAN / ED COURSE  I reviewed the triage vital signs and the nursing notes.                                Patient's presentation is most consistent with acute presentation with potential threat to life or bodily function.  Differential diagnosis includes, but is not limited to, TIA or stroke, ICH, temporal arteritis, complex migraine headache, hypertensive crisis   The patient is on the cardiac monitor to evaluate for evidence of arrhythmia and/or significant heart rate changes.  MDM:    Considered stroke but outside of thrombolytic indications due to hypertension, outside of time window, and resolving symptoms.  Completely resolved symptoms other than headache at this time.  Will proceed with CT head, stroke labs, subsequent imaging for TIA/stroke workup and admission.  Hypertensive markedly will give home dose of her twice daily clonidine  but otherwise allow for permissive hypertension in the setting of suspected stroke-unfortunately she failed her swallow screen so I gave her a small amount of IV labetalol  5 mg to try to get blood pressure somewhat controlled.  Still will want allow for permissive hypertension around 220 systolic while stroke/TIA workup pending.  Considered temporal arteritis with without tenderness or visual changes think this is less likely.  No trauma to suggest traumatic injuries or ICH but will be further characterized on advanced imaging in any case.      FINAL CLINICAL IMPRESSION(S) / ED DIAGNOSES   Final diagnoses:  Aphasia  Hypertensive crisis  Nonintractable headache, unspecified chronicity pattern, unspecified headache type     Rx / DC Orders   ED Discharge Orders     None        Note:  This document was prepared using Dragon voice recognition  software and may include unintentional dictation errors.    Shams Ginnie, MD 05/09/23 0111    Shams Ginnie, MD 05/09/23 [phone]

## 2023-05-09 NOTE — Assessment & Plan Note (Signed)
 -  We will continue statin therapy and check fasting lipids.

## 2023-05-09 NOTE — Assessment & Plan Note (Signed)
>>  ASSESSMENT AND PLAN FOR TIA (TRANSIENT ISCHEMIC ATTACK) WRITTEN ON 05/09/2023  2:45 AM BY MANSY, JAN A, MD  - The patient will be admitted to an observation medically monitored bed. - This manifested by expressive dysphasia that resolved and currently dysphagia with left-sided headache. - We will follow neuro checks q.4 hours for 24 hours.   - The patient will be placed on aspirin .   - Will obtain a brain MRI without contrast as well as Head and neck CTA and 2D echo with bubble study .   - A neurology consultation  as well as physical/occupation/speech therapy consults will be obtained in a.m. - I notified Dr. Michaela about the patient. - The patient will be placed on statin therapy and fasting lipids will be checked.

## 2023-05-09 NOTE — Evaluation (Signed)
 Clinical/Bedside Swallow Evaluation Patient Details  Name: Amber Padilla MRN: [id number] Date of Birth: 28-Dec-1935  Today's Date: 05/09/2023 Time: SLP Start Time (ACUTE ONLY): 0900 SLP Stop Time (ACUTE ONLY): 1000 SLP Time Calculation (min) (ACUTE ONLY): 60 min  Past Medical History:  Past Medical History:  Diagnosis Date   Allergy    Diabetes mellitus without complication (HCC)    Hyperlipidemia    Hypertension    Hypertensive emergency 07/14/2020   Hypertensive urgency    Thyroid  disease    Past Surgical History:  Past Surgical History:  Procedure Laterality Date   BUNIONECTOMY Bilateral 2005   HPI:  Pt is a 88 y.o. female with medical history significant for type 2 diabetes mellitus, hypertension, hypothyroidism and dyslipidemia, who presented to the emergency room with acute onset of expressive dysphasia, she was in her normal state of health around 5 PM with her daughter watching TV and talking to her as her normal.  Her daughter left and around 51 PM and the patient texted her incoherently, first with jumbled letters and then with random words that did not piece together to make any sense.  The daughter went to check on her mother around 69 PM and she did not answer the door.  She appeared confused and was having word salad as described per her daughter who is an [employer].  She elaborated that she was training words together that were completely nonsensical however she appeared unfazed by her word choice.  This resolved by the time she came to the ER and her only complaint was left-sided headache with no visual changes.  She failed her swallow test in the ED.     MRI:  No acute intracranial abnormality.  2. Moderate for age cerebral white matter signal changes, most  commonly due to chronic small vessel disease.     Assessment / Plan / Recommendation  Clinical Impression   Pt seen for BSE today. Pt awakened, verbal but w/ expressive language deficits noted. Spontaneous responses/remarks  were clear and intelligible: I don't like that at all when give tsp of pudding/applesauce. Pt followed basic commands w/ cues. Noted R side neglect; but could turn past midline w/ MOD+ cues. Speech soft/mumbled. Eyes closed often when no stimulation.  On RA, afebrile. WBC WNL.   Pt appears to present w/ functional oropharyngeal phase swallowing w/ No oropharyngeal phase dysphagia noted, No neuromuscular deficits noted. Pt consumed po trials w/ No overt, clinical s/s of aspiration during po trials.  Pt appears at reduced risk for aspiration following general aspiration precautions and when supported at meals. However, pt does have challenging factors that could impact her oropharyngeal swallowing to include new neurological change/expressive language deficits, deconditioning/weakness, and advanced age. Also noted is R side neglect behavior. These factors can increase risk for dysphagia as well as decreased oral intake overall.   During po trials, pt consumed all consistencies w/ no overt coughing, decline in vocal quality, or change in respiratory presentation during/post trials. O2 sats remained in upper 90s. Oral phase appeared [facility] w/ timely bolus management, mastication, and control of bolus propulsion for A-P transfer for swallowing. Oral clearing achieved w/ all trial consistencies -- moistened, soft foods given. OM Exam appeared [facility] w/ no unilateral weakness noted. Speech Clear, intelligible w/ spontaneous remarks. Pt fed self w/ setup support.   Recommend a Regular/mech soft consistency diet w/ well-Cut meats, moistened foods; Thin liquids -- monitor and pt should Hold Cup when drinking. Recommend general aspiration precautions, reduce distractions during meals.  Support tray setup and guidance as needed d/t R side neglect behavior. Pills WHOLE in Puree for safer, easier swallowing IF any difficulty swallowing 1 at a time w/ water w/ NSG.  Education given on Pills in Puree option; food consistencies  and easy to eat options; general aspiration precautions to pt and Dtr/Family present. ST services will f/u w/ diet check x1 and cognitive-language evaluation next 1-2 days to determine if any needs at D/C. MD/NSG updated, agreed. Recommend Dietician f/u for support. Family agreed. Pt resting.  Precautions posted in room, chart.  SLP Visit Diagnosis: Dysphagia, unspecified (R13.10)    Aspiration Risk   (reduced following general aspiration precautions)    Diet Recommendation   Thin;Age appropriate regular (cut meats, moistened foods) = Regular/mech soft consistency diet w/ well-Cut meats, moistened foods; Thin liquids -- monitor and pt should Hold Cup when drinking. Recommend general aspiration precautions, reduce distractions during meals. Support tray setup and guidance as needed d/t R side neglect behavior.  Medication Administration: Whole meds with puree (IF unable to swallow w/ water 1 at a time)    Other  Recommendations Recommended Consults:  (Dietician) Oral Care Recommendations: Oral care BID;Patient independent with oral care (w/ support)    Recommendations for follow up therapy are one component of a multi-disciplinary discharge planning process, led by the attending physician.  Recommendations may be updated based on patient status, additional functional criteria and insurance authorization.  Follow up Recommendations No SLP follow up (for swallowing expected)      Assistance Recommended at Discharge  FULL at this time  Functional Status Assessment Patient has had a recent decline in their functional status and demonstrates the ability to make significant improvements in function in a reasonable and predictable amount of time.  Frequency and Duration min 1 x/week  1 week       Prognosis Prognosis for improved oropharyngeal function: Good Barriers to Reach Goals: Cognitive deficits;Language deficits      Swallow Study   General Date of Onset: 05/09/23 HPI: Pt is a 88 y.o.  female with medical history significant for type 2 diabetes mellitus, hypertension, hypothyroidism and dyslipidemia, who presented to the emergency room with acute onset of expressive dysphasia, she was in her normal state of health around 5 PM with her daughter watching TV and talking to her as her normal.  Her daughter left and around 24 PM and the patient texted her incoherently, first with jumbled letters and then with random words that did not piece together to make any sense.  The daughter went to check on her mother around 17 PM and she did not answer the door.  She appeared confused and was having word salad as described per her daughter who is an [employer].  She elaborated that she was training words together that were completely nonsensical however she appeared unfazed by her word choice.  This resolved by the time she came to the ER and her only complaint was left-sided headache with no visual changes.  She failed her swallow test in the ED.    MRI:  No acute intracranial abnormality.  2. Moderate for age cerebral white matter signal changes, most  commonly due to chronic small vessel disease. Type of Study: Bedside Swallow Evaluation Previous Swallow Assessment: none Diet Prior to this Study: NPO (regular diet at home per family) Temperature Spikes Noted: No (wbc 6.6) Respiratory Status: Room air History of Recent Intubation: No Behavior/Cognition: Alert;Cooperative;Confused;Pleasant mood;Distractible;Requires cueing (expressive language deficits) Oral Cavity Assessment: Within Functional  Limits Oral Care Completed by SLP: Yes Oral Cavity - Dentition: Adequate natural dentition (bridge+) Vision: Functional for self-feeding Self-Feeding Abilities: Able to feed self;Needs assist;Needs set up Patient Positioning: Upright in bed (MOD assist) Baseline Vocal Quality: Low vocal intensity (WFL) Volitional Cough: Strong Volitional Swallow: Able to elicit    Oral/Motor/Sensory Function Overall Oral  Motor/Sensory Function: Within functional limits   Ice Chips Ice chips: Within functional limits Presentation: Spoon (fed; 2 trials)   Thin Liquid Thin Liquid: Within functional limits Presentation: Cup (10+ trials)    Nectar Thick Nectar Thick Liquid: Not tested   Honey Thick Honey Thick Liquid: Not tested   Puree Puree: Within functional limits Presentation: Spoon;Self Fed (supported; 10 trials)   Solid     Solid: Within functional limits Presentation: Self Fed (moistened well; 8 trials)        Comer Portugal, MS, CCC-SLP Speech Language Pathologist Rehab Services; [facility] [phone] (ascom) Moani Weipert 05/09/2023,1:43 PM

## 2023-05-09 NOTE — Care Management Obs Status (Signed)
 MEDICARE OBSERVATION STATUS NOTIFICATION   Patient Details  Name: Amber Padilla MRN: [id number] Date of Birth: [birth year]   Medicare Observation Status Notification Given:  Yes Patient was not oriented per nursing staff and sister was in the room with patient and was orally explained the form and agreed to sign for patient at the time. Hard copy given to patient's sister.     Heron KATHEE Edison, RN 05/09/2023, 5:15 PM

## 2023-05-09 NOTE — Progress Notes (Signed)
 Pt's BP remained >200 since arriving to the floor despite giving both PRN medication. Provider made aware, once time dose of 10mg  hydralazine  ordered and given by this RN at shift change

## 2023-05-09 NOTE — Hospital Course (Addendum)
 Hospital course / significant events:   HPI: Amber Padilla is a 88 y.o. female with medical history significant for type 2 diabetes mellitus, hypertension, hypothyroidism and dyslipidemia, who presented to the emergency room with acute onset of expressive dysphasia, she was in her normal state of health around 5 PM with her daughter watching TV and talking to her as her normal.  Her daughter left and around 59 PM and the patient texted her incoherently, first with jumbled letters and then with random words that did not piece together to make any sense.  The daughter went to check on her mother around 32 PM and she did not answer the door.  She appeared confused and was having word salad as described per her daughter who is an [employer].  She elaborated that she was training words together that were completely nonsensical however she appeared unfazed by her word choice.  This resolved by the time she came to the ER and her only complaint was left-sided headache   02/09: to ED. Symptoms resolved at time of admission and MRI no acute CVA however alter in the morning expressive aphasia recurred, as well as coordination difficulty w/ upper extremities, describing L frontal/sinus headache (note no sinus infection on imaging but she does have cyst in maxillary sinus on L). Neurology recs - hold repeat MRI but would consider tomorrow, will get EEG, given headache will rx migraine cocktail w/ reglan  + depakote in case seizure. Continue DAPT, statin  02/10: Echo done, pending read. EEG not done today. Speech still slurred - will repeat MRI brain   02/11: MRI (+)Small acute left posterior temporal lobe infarct. Echo EF 60-65, MOd high Pulm A sytolic pressure, G2DD, LA and RA dilation, mild/mod MR but no other significant valvular dz,      Consultants:  Neurology   Procedures/Surgeries: none      ASSESSMENT & PLAN:   Small acute left posterior temporal lobe infarct confirmed MRI 02/10.   Potentially complicated  by migraine, HTN encephalopathy.  Less likely for seizure  Appreciate neurology recs  Permissive HTN window is passed DAPT, statin Continue telemetry  EEG pending getting done  Migraine cocktail yesterday w/ Reglan  10 mg x 1 + Depacon  1500 mg x 1  B12, TSH, ammonia    Hypertensive urgency - may explain headache also  Record review - BP typically is well controlled, family does not think pt has missed her usual meds  BP at goal now Continue current meds    Dyslipidemia Statin Lipid panel   Type 2 diabetes mellitus without complications (HCC) SSI     No concerns based on BMI: Body mass index is 23.88 kg/m.  Underweight - under 18  overweight - 25 to 29 obese - 30 or more Class 1 obesity: BMI of 30.0 to 34 Class 2 obesity: BMI of 35.0 to 39 Class 3 obesity: BMI of 40.0 to 49 Super Morbid Obesity: BMI 50-59 Super-super Morbid Obesity: BMI 60+ Significantly low or high BMI is associated with higher medical risk.  Weight management advised as adjunct to other disease management and risk reduction treatments    DVT prophylaxis: lovenox  IV fluids: d/c Nutrition: aspiration precautions. Regular diet  Central lines / invasive devices: none  Code Status: FULL CODE ACP documentation reviewed:  none on file in [facility]  TOC needs: home health Barriers to dispo / significant pending items:

## 2023-05-09 NOTE — H&P (Addendum)
 [place]   PATIENT NAME: Amber Padilla    MR#:  [id number]  DATE OF BIRTH:  [birth year]  DATE OF ADMISSION:  05/09/2023  PRIMARY CARE PHYSICIAN: Justus Leita DEL, MD   Patient is coming from: Home  REQUESTING/REFERRING PHYSICIAN: Cyrena Mylar, MD  CHIEF COMPLAINT:   Chief Complaint  Patient presents with   Altered Mental Status   Eye Pain    HISTORY OF PRESENT ILLNESS:  Amber Padilla is a 88 y.o. female with medical history significant for type 2 diabetes mellitus, hypertension, hypothyroidism and dyslipidemia, who presented to the emergency room with acute onset of expressive dysphasia, she was in her normal state of health around 5 PM with her daughter watching TV and talking to her as her normal.  Her daughter left and around 49 PM and the patient texted her incoherently, first with jumbled letters and then with random words that did not piece together to make any sense.  The daughter went to check on her mother around 100 PM and she did not answer the door.  She appeared confused and was having word salad as described per her daughter who is an [employer].  She elaborated that she was training words together that were completely nonsensical however she appeared unfazed by her word choice.  This resolved by the time she came to the ER and her only complaint was left-sided headache with no visual changes.  She failed her swallow test in the ED.  She denied any paresthesias or focal muscle weakness.  No chest pain or palpitations.  No tinnitus or vertigo.  No urinary or stool incontinence.  No weakness seizures.  No fever or chills.  No dysuria, oliguria or hematuria or flank pain.  No cough or wheezing or dyspnea.  ED Course: When she came to the ER, BP was 253/88 with otherwise normal vital signs.  Labs revealed creatinine of 1.19 and glucose of 102 with otherwise unremarkable CMP.  CBC showed anemia close to baseline.  It showed thrombocytopenia of 145. EKG as reviewed by me : EKG  showed sinus bradycardia with rate of 52. Imaging: Noncontrasted head CT scan revealed mild for age atrophy and small vessel disease with no acute intracranial abnormalities.  The patient was given 5 mg of IV labetalol .  He will be admitted to an observation medical telemetry bed for further evaluation and management. PAST MEDICAL HISTORY:   Past Medical History:  Diagnosis Date   Allergy    Diabetes mellitus without complication (HCC)    Hyperlipidemia    Hypertension    Hypertensive emergency 07/14/2020   Hypertensive urgency    Thyroid  disease     PAST SURGICAL HISTORY:   Past Surgical History:  Procedure Laterality Date   BUNIONECTOMY Bilateral 2005    SOCIAL HISTORY:   Social History   Tobacco Use   Smoking status: Never   Smokeless tobacco: Never  Substance Use Topics   Alcohol use: Never    FAMILY HISTORY:   Family History  Problem Relation Age of Onset   Diabetes Mother     DRUG ALLERGIES:   Allergies  Allergen Reactions   Amlodipine Swelling    Other Reaction(s): Edema   Propoxyphene Rash    REVIEW OF SYSTEMS:   ROS As per history of present illness. All pertinent systems were reviewed above. Constitutional, HEENT, cardiovascular, respiratory, GI, GU, musculoskeletal, neuro, psychiatric, endocrine, integumentary and hematologic systems were reviewed and are otherwise negative/unremarkable except for positive findings mentioned above  in the HPI.   MEDICATIONS AT HOME:   Prior to Admission medications   Medication Sig Start Date End Date Taking? Authorizing Provider  acetaminophen  (TYLENOL ) 500 MG tablet Take 1,000 mg by mouth every 6 (six) hours as needed.    [provider]  aspirin  EC 81 MG tablet Take 81 mg by mouth daily.    [provider]  atorvastatin  (LIPITOR) 10 MG tablet Take 1 tablet (10 mg total) by mouth daily. 07/17/22   Justus Leita DEL, MD  blood glucose meter kit and supplies Dispense based on patient and  insurance preference. Use up to four times daily as directed. (FOR ICD-10 E10.9, E11.9). 10/16/20   Justus Leita DEL, MD  CALCIUM  PO Take 600 mg by mouth.    [provider]  calcium -vitamin D  (OSCAL WITH D) 500-200 MG-UNIT tablet Take 1 tablet by mouth.    [provider]  Cholecalciferol  (VITAMIN D3 PO) Take 1,000 Units by mouth.    [provider]  cloNIDine  (CATAPRES ) 0.2 MG tablet TAKE 1 TABLET TWICE A DAY 12/20/22   Justus Leita DEL, MD  doxazosin  (CARDURA ) 4 MG tablet TAKE 1 TABLET DAILY 12/20/22   Berglund, Laura H, MD  ferrous sulfate  324 MG TBEC Take 324 mg by mouth daily.    [provider]  Lancets MISC 1 each by Does not apply route daily at 6 (six) AM. 12/31/21   Justus Leita DEL, MD  Magnesium  500 MG CAPS Take by mouth.    [provider]  metFORMIN  (GLUCOPHAGE ) 500 MG tablet TAKE 1 TABLET TWICE DAILY  WITH MEALS **ZYDUS MFR** 07/17/22   Berglund, Laura H, MD  metoprolol  succinate (TOPROL -XL) 100 MG 24 hr tablet TAKE 1 TABLET DAILY WITH ORIMMEDIATELY FOLLOWING A    MEAL 12/20/22   Justus Leita DEL, MD  [facility] VERIO test strip Use up to 4 times daily as directed E10.9, E11.9 11/05/20   Justus Leita DEL, MD  telmisartan  (MICARDIS ) 80 MG tablet TAKE 1 TABLET DAILY 12/20/22   Berglund, Laura H, MD  vitamin B-12 (CYANOCOBALAMIN ) 500 MCG tablet Take 500 mcg by mouth daily.    [provider]      VITAL SIGNS:  Blood pressure (!) 184/70, pulse (!) 50, temperature 98.4 F (36.9 C), temperature source Oral, resp. rate 16, height 5' 4 (1.626 m), weight 63.1 kg, SpO2 99%.  PHYSICAL EXAMINATION:  Physical Exam  GENERAL:  88 y.o.-year-old patient lying in the bed with no acute distress.  EYES: Pupils equal, round, reactive to light and accommodation. No scleral icterus. Extraocular muscles intact.  HEENT: Head atraumatic, normocephalic. Oropharynx and nasopharynx clear.  NECK:  Supple, no jugular venous distention. No thyroid   enlargement, no tenderness.  LUNGS: Normal breath sounds bilaterally, no wheezing, rales,rhonchi or crepitation. No use of accessory muscles of respiration.  CARDIOVASCULAR: Regular rate and rhythm, S1, S2 normal. No murmurs, rubs, or gallops.  ABDOMEN: Soft, nondistended, nontender. Bowel sounds present. No organomegaly or mass.  EXTREMITIES: No pedal edema, cyanosis, or clubbing.  NEUROLOGIC: Cranial nerves II through XII are intact except for mild expressive dysphasia and current dysphagia. Muscle strength 5/5 in all extremities. Sensation intact. Gait not checked.  PSYCHIATRIC: The patient is alert and oriented x 3.  Normal affect and good eye contact. SKIN: No obvious rash, lesion, or ulcer.   LABORATORY PANEL:   CBC Recent Labs  Lab 05/09/23 0043  WBC 5.9  HGB 10.5*  HCT 32.3*  PLT 145*   ------------------------------------------------------------------------------------------------------------------  Chemistries  Recent Labs  Lab 05/09/23 0043  NA 138  K 3.6  CL 99  CO2 26  GLUCOSE 102*  BUN 22  CREATININE 1.19*  CALCIUM  9.2  AST 18  ALT 8  ALKPHOS 47  BILITOT 1.0   ------------------------------------------------------------------------------------------------------------------  Cardiac Enzymes No results for input(s): TROPONINI in the last 168 hours. ------------------------------------------------------------------------------------------------------------------  RADIOLOGY:  CT HEAD WO CONTRAST Result Date: 05/09/2023 CLINICAL DATA:  Neuro deficit, acute, stroke suspected. Patient's daughter indicated the patient had altered mental status yesterday evening, with left orbital left cranial pain. EXAM: CT HEAD WITHOUT CONTRAST TECHNIQUE: Contiguous axial images were obtained from the base of the skull through the vertex without intravenous contrast. RADIATION DOSE REDUCTION: This exam was performed according to the departmental dose-optimization program which  includes automated exposure control, adjustment of the mA and/or kV according to patient size and/or use of iterative reconstruction technique. COMPARISON:  None Available. FINDINGS: Brain: There is mild global atrophy and mild small-vessel disease of the cerebral white matter. The ventricles are normal in size and position. No asymmetry is seen worrisome for acute cortical based infarct, hemorrhage, mass or mass effect. Basal cisterns are clear. No old territorial infarcts. Vascular: Scattered calcific plaque both siphons. No hyperdense central vasculature. Skull: Negative for fractures or focal lesions. Hyperostosis frontalis interna. Sinuses/Orbits: No acute abnormality. Old lens extractions. Clear visualized sinuses and mastoids. Other: None. IMPRESSION: 1. No acute intracranial CT findings. 2. Mild for age atrophy and small-vessel disease. Electronically Signed   By: Francis Quam M.D.   On: 05/09/2023 01:24      IMPRESSION AND PLAN:  Assessment and Plan: * TIA (transient ischemic attack) - The patient will be admitted to an observation medically monitored bed. - This manifested by expressive dysphasia that resolved and currently dysphagia with left-sided headache. - We will follow neuro checks q.4 hours for 24 hours.   - The patient will be placed on aspirin .   - Will obtain a brain MRI without contrast as well as Head and neck CTA and 2D echo with bubble study .   - A neurology consultation  as well as physical/occupation/speech therapy consults will be obtained in a.m. - I notified Dr. Michaela about the patient. - The patient will be placed on statin therapy and fasting lipids will be checked.    Hypertensive urgency - We will continue antihypertensive therapy when able to take p.o. due to current dysphagia. - We will allow permissive hypertension.  As needed IV hydralazine .  Dyslipidemia - We will continue statin therapy and check fasting lipids.  Type 2 diabetes mellitus  without complications (HCC) - The patient will be placed on supplemental coverage with NovoLog . - We will hold off metformin .    DVT prophylaxis: Lovenox .  Advanced Care Planning:  Code Status: full code.  Family Communication:  The plan of care was discussed in details with the patient (and family). I answered all questions. The patient agreed to proceed with the above mentioned plan. Further management will depend upon hospital course. Disposition Plan: Back to previous home environment Consults called: Neurology All the records are reviewed and case discussed with ED provider.  Status is: Observation  I certify that at the time of admission, it is my clinical judgment that the patient will require hospital care extending less than 2 midnights.                            Dispo: The patient is from:  Home              Anticipated d/c is to: Home              Patient currently is not medically stable to d/c.              Difficult to place patient: No  Madison DELENA Peaches M.D on 05/09/2023 at 2:47 AM  [facility]   From 7 PM-7 AM, contact night-coverage [email or web address]  CC: Primary care physician; Justus Leita DEL, MD

## 2023-05-09 NOTE — Progress Notes (Signed)
 OT Cancellation Note  Patient Details Name: Amiyah Shryock MRN: [id number] DOB: 05/03/1935   Cancelled Treatment:    Reason Eval/Treat Not Completed: Medical issues which prohibited therapy. Orders received, per chart review pt pending further medical workup with neurology consult and high BP readings. OT will hold eval at this time and attempt when medically appropriate.    Sage Kopera L. Iylah Dworkin, OTR/L  05/09/23, 11:23 AM

## 2023-05-09 NOTE — ED Triage Notes (Signed)
 Pt arrived via POV with daughter reports LKW around 1700, states around 940pm tonight daughter received text messages that were not making sense, daughter states she went to the patient's house and found her not making sense unable to state her name and c/o L eye pain and L side headache.   On arrival, pt has headache on L side of the head and eye pain, pt able to speak clearly and able to state her name and DOB.   Pt taken to room 2 and Dr. Cyrena notified and in room at this time.

## 2023-05-09 NOTE — Consult Note (Signed)
 NEUROLOGY CONSULT NOTE   Date of service: May 09, 2023 Patient Name: Amber Padilla MRN:  [id number] DOB:  Oct 04, 1935 Chief Complaint: aphasia Requesting Provider: Marsa Edelman, DO  History of Present Illness  Amber Padilla is a 88 y.o. female with hx of hypertension, hyperlipidemia, diabetes who presents with difficulty speaking.  On 2/08, she was texting with her family around 49 PM and her texts were incoherent.  That prompted her family to call but she was not making sense.  Her daughter went to check on her mother around 35 PM and appeared confused.  She was brought into the emergency department where her symptoms significantly improved.  She complained of a left-sided headache with photophobia during this time.  At approximately the same time she went for an MRI this morning, her symptoms seem to recur and she has been having difficulty with speech ever since that time.  Per family, at baseline there are no concerns for short-term memory problems, dementia, or other cognitive disorders.  Family denies any history of similar episodes, denies any abnormal movements such as twitching, or loss of consciousness.  She does have a history of headaches, but has attributed them to sinus headaches.  LKW: 5 PM 2/8 Modified rankin score: 0-Completely asymptomatic and back to baseline post- stroke IV Thrombolysis: No, outside of window EVT: No, no LVO  NIHSS components Score: Comment  1a Level of Conscious 0[x]  1[]  2[]  3[]      1b LOC Questions 0[]  1[]  2[x]       1c LOC Commands 0[]  1[x]  2[]       2 Best Gaze 0[x]  1[]  2[]       3 Visual 0[x]  1[]  2[]  3[]      4 Facial Palsy 0[x]  1[]  2[]  3[]      5a Motor Arm - left 0[x]  1[]  2[]  3[]  4[]  UN[]    5b Motor Arm - Right 0[x]  1[]  2[]  3[]  4[]  UN[]    6a Motor Leg - Left 0[x]  1[]  2[]  3[]  4[]  UN[]    6b Motor Leg - Right 0[x]  1[]  2[]  3[]  4[]  UN[]    7 Limb Ataxia 0[x]  1[]  2[]  3[]  UN[]     8 Sensory 0[x]  1[]  2[]  UN[]      9 Best Language 0[]  1[]   2[x]  3[]      10 Dysarthria 0[]  1[x]  2[]  UN[]      11 Extinct. and Inattention 0[x]  1[]  2[]       TOTAL: 6      Past History   Past Medical History:  Diagnosis Date   Allergy    Diabetes mellitus without complication (HCC)    Hyperlipidemia    Hypertension    Hypertensive emergency 07/14/2020   Hypertensive urgency    Thyroid  disease     Past Surgical History:  Procedure Laterality Date   BUNIONECTOMY Bilateral 2005    Family History: Family History  Problem Relation Age of Onset   Diabetes Mother     Social History  reports that she has never smoked. She has never used smokeless tobacco. She reports that she does not drink alcohol and does not use drugs.  Allergies  Allergen Reactions   Amlodipine Swelling    Other Reaction(s): Edema   Propoxyphene Rash    Medications   Current Facility-Administered Medications:    [START ON 05/10/2023]  stroke: early stages of recovery book, , Does not apply, Once, Mansy, Jan A, MD   0.9 %  sodium chloride  infusion, , Intravenous, Continuous, Mansy, Jan A, MD, Last Rate: 100 mL/hr at 05/09/23 0441, New Bag  at 05/09/23 0441   acetaminophen  (TYLENOL ) tablet 650 mg, 650 mg, Oral, Q6H PRN **OR** acetaminophen  (TYLENOL ) suppository 650 mg, 650 mg, Rectal, Q6H PRN, Mansy, Jan A, MD   acetaminophen  (TYLENOL ) tablet 1,000 mg, 1,000 mg, Oral, Once, Cyrena Mylar, MD   aspirin  EC tablet 81 mg, 81 mg, Oral, Daily, Mansy, Jan A, MD, 81 mg at 05/09/23 9144   atorvastatin  (LIPITOR) tablet 10 mg, 10 mg, Oral, Daily, Mansy, Jan A, MD, 10 mg at 05/09/23 1211   calcium -vitamin D  (OSCAL WITH D) 500-5 MG-MCG per tablet 1 tablet, 1 tablet, Oral, Q breakfast, Mansy, Jan A, MD, 1 tablet at 05/09/23 1212   cholecalciferol  (VITAMIN D3) 25 MCG (1000 UNIT) tablet 1,000 Units, 1,000 Units, Oral, Daily, Mansy, Jan A, MD, 1,000 Units at 05/09/23 1212   cloNIDine  (CATAPRES ) tablet 0.2 mg, 0.2 mg, Oral, Once, Cyrena Mylar, MD   cloNIDine  (CATAPRES ) tablet 0.2 mg, 0.2  mg, Oral, TID, Mansy, Jan A, MD, 0.2 mg at 05/09/23 1213   clopidogrel  (PLAVIX ) tablet 75 mg, 75 mg, Oral, Daily, Mansy, Jan A, MD, 75 mg at 05/09/23 1212   cyanocobalamin  (VITAMIN B12) tablet 500 mcg, 500 mcg, Oral, Daily, Mansy, Jan A, MD, 500 mcg at 05/09/23 1212   doxazosin  (CARDURA ) tablet 4 mg, 4 mg, Oral, Daily, Mansy, Jan A, MD, 4 mg at 05/09/23 1212   enalaprilat  (VASOTEC) injection 0.625 mg, 0.625 mg, Intravenous, Q6H PRN, Mansy, Jan A, MD, 0.625 mg at 05/09/23 9486   enoxaparin  (LOVENOX ) injection 30 mg, 30 mg, Subcutaneous, Q24H, Mansy, Jan A, MD, 30 mg at 05/09/23 9155   ferrous sulfate  tablet 325 mg, 325 mg, Oral, Daily, Mansy, Jan A, MD, 325 mg at 05/09/23 1212   hydrALAZINE  (APRESOLINE ) injection 10 mg, 10 mg, Intravenous, Q6H PRN, Jesus America, NP, 10 mg at 05/09/23 0327   irbesartan  (AVAPRO ) tablet 75 mg, 75 mg, Oral, Daily, Mansy, Jan A, MD, 75 mg at 05/09/23 1212   magnesium  hydroxide (MILK OF MAGNESIA) suspension 30 mL, 30 mL, Oral, Daily PRN, Mansy, Jan A, MD   metoCLOPramide  (REGLAN ) injection 10 mg, 10 mg, Intravenous, Once, Michaela Aisha SQUIBB, MD   metoprolol  succinate (TOPROL -XL) 24 hr tablet 100 mg, 100 mg, Oral, Daily, Jesus America, NP, 100 mg at 05/09/23 1212   nitroGLYCERIN  (NITROGLYN) 2 % ointment 1 inch, 1 inch, Topical, Q6H PRN, Mansy, Jan A, MD   ondansetron  (ZOFRAN ) tablet 4 mg, 4 mg, Oral, Q6H PRN **OR** ondansetron  (ZOFRAN ) injection 4 mg, 4 mg, Intravenous, Q6H PRN, Mansy, Jan A, MD, 4 mg at 05/09/23 9165   traZODone  (DESYREL ) tablet 25 mg, 25 mg, Oral, QHS PRN, Mansy, Jan A, MD   valproate (DEPACON ) 1,500 mg in dextrose  5 % 50 mL IVPB, 1,500 mg, Intravenous, Once, Michaela Aisha SQUIBB, MD  Vitals   Vitals:   05/09/23 0514 05/09/23 0622 05/09/23 0835 05/09/23 1149  BP: (!) 218/85 (!) 225/85 (!) 189/74 (!) 173/67  Pulse:  64 82 74  Resp:   17 17  Temp:   98.4 F (36.9 C) 99.4 F (37.4 C)  TempSrc:      SpO2:  96% 96% 93%  Weight:       Height:        Body mass index is 23.88 kg/m.  Physical Exam   Constitutional: Appears well-developed and well-nourished.  Neck is supple Neurologic Examination   Neuro: Mental Status: Patient is awake, alert, she repeatedly states yes to any question, but does attempt to verbalize resulting in incomprehensible sounds.  She appears very mildly dysarthric.  She is able to follow commands to stick out her tongue, when asked to show her thumbs she shows her hand but does not clearly demonstrate a thumbs up. Cranial Nerves: II: Blinks to threat bilaterally. Pupils are equal, round, and reactive to light.   III,IV, VI: EOMI without ptosis or diploplia.  V: Facial sensation is symmetric to temperature VII: Facial movement is symmetric.  XII: tongue is midline without atrophy or fasciculations.  Motor: She does not cooperate with formal confrontational testing, but appears to have decent strength bilaterally Sensory: She response to noxious stimulation bilaterally Cerebellar: No clear ataxia       Labs/Imaging/Neurodiagnostic studies   CBC:  Recent Labs  Lab 17-May-2023 0043 05-17-23 0443  WBC 5.9 6.6  NEUTROABS 3.0  --   HGB 10.5* 11.4*  HCT 32.3* 35.7*  MCV 95.3 94.2  PLT 145* 143*   Basic Metabolic Panel:  Lab Results  Component Value Date   NA 138 05-17-23   K 3.6 May 17, 2023   CO2 21 (L) 05-17-2023   GLUCOSE 96 17-May-2023   BUN 19 05-17-2023   CREATININE 1.18 (H) 2023/05/17   CALCIUM  9.2 05/17/2023   GFRNONAA 45 (L) 05-17-2023   GFRAA 62 12/27/2019   Lipid Panel:  Lab Results  Component Value Date   LDLCALC 167 (H) May 17, 2023   HgbA1c:  Lab Results  Component Value Date   HGBA1C 6.3 (H) 01/20/2023   Urine Drug Screen: No results found for: LABOPIA, COCAINSCRNUR, LABBENZ, AMPHETMU, THCU, LABBARB  Alcohol Level     Component Value Date/Time   ETH <10 17-May-2023 0043   INR  Lab Results  Component Value Date   INR 1.1 05/17/23    APTT  Lab Results  Component Value Date   APTT 31 May 17, 2023    CT Head without contrast(Personally reviewed): Negative  CT angio Head and Neck with contrast(Personally reviewed): No LVO  MRI Brain(Personally reviewed): Negative  ASSESSMENT   Amber Padilla is a 88 y.o. female with a history of multiple stroke risk factors as well as unilateral headaches who presents with difficulty speaking in the setting of a unilateral left-sided headache.  Though her MRI was negative, her symptoms had improved around the time of her MRI, and it is possible that a stuttering thalamic lacunar can give aphasia in the setting.  Complicated migraine given her history of headaches as well as her left-sided unilateral headache would be another consideration.  Seizure can be another cause of focal neurological dysfunction, but without any discrete episodes, loss of consciousness, motor involvement, or other activity to suggest seizure I think this is less likely.  She is afebrile and does not have any meningismus making infection less likely.  Her blood pressures are exceedingly high and therefore I think both hypertensive encephalopathy would also be a consideration.  RECOMMENDATIONS  Headache cocktail with Reglan  10 mg x 1 and Depacon  1500 mg x 1 EEG B12, TSH, ammonia If her symptoms do not improve by tomorrow, would consider repeating MRI Neurology will continue to follow ______________________________________________________________________    Signed, Aisha Seals, MD [facility]

## 2023-05-09 NOTE — Progress Notes (Signed)
 PT Cancellation Note  Patient Details Name: Amber Padilla MRN: [id number] DOB: September 27, 1935   Cancelled Treatment:    Reason Eval/Treat Not Completed: Medical issues which prohibited therapy (Will defer PT evaluation until pressure back in safe range.) Most recent SBP .    10:24 AM, 05/09/23 Peggye JAYSON Linear, PT, DPT Physical Therapist - [place]  [phone] (ASCOM)     Donis Pinder C 05/09/2023, 10:24 AM

## 2023-05-09 NOTE — Progress Notes (Signed)
 PROGRESS NOTE    Amber Padilla   FMW:[id number] DOB: 15-Oct-1935  DOA: 05/09/2023 Date of Service: 05/09/23 which is hospital day 0  PCP: Justus Leita DEL, MD    Hospital course / significant events:   HPI: Amber Padilla is a 88 y.o. female with medical history significant for type 2 diabetes mellitus, hypertension, hypothyroidism and dyslipidemia, who presented to the emergency room with acute onset of expressive dysphasia, she was in her normal state of health around 5 PM with her daughter watching TV and talking to her as her normal.  Her daughter left and around 61 PM and the patient texted her incoherently, first with jumbled letters and then with random words that did not piece together to make any sense.  The daughter went to check on her mother around 46 PM and she did not answer the door.  She appeared confused and was having word salad as described per her daughter who is an [employer].  She elaborated that she was training words together that were completely nonsensical however she appeared unfazed by her word choice.  This resolved by the time she came to the ER and her only complaint was left-sided headache   02/09: to ED. Symptoms resolved at time of admission and MRI no acute CVA however alter in the morning expressive aphasia recurred, as well as coordination difficulty w/ upper extremities, describing L frontal/sinus headache (note no sinus infection on imaging but she does have cyst in maxillary sinus on L). Neurology recs - hold repeat MRI but would consider tomorrow, will get EEG, given headache will rx migraine cocktail w/ depakote in case seizure. Continue DAPT, statin      Consultants:  Neurology   Procedures/Surgeries: none      ASSESSMENT & PLAN:   TIA (transient ischemic attack) w/ recurrent symptoms - Ddx evolving CVA, atypical migraine, focal seizure  neuro checks q.4 hours for 24 hours Permissive HTN DAPT, statin Echo  Continue telemetry  Appreciate  neurology recs  Consider repeat MRI tomorrow EEG tomorrow  Migraine cocktail     Hypertensive urgency - may explain headache also  Record review - BP typically is well controlled, family does not think pt has missed her usual meds  Permissive HTN per neurology Npo pending able to swallow medications safely, anticipate restart home antihypertensives    Dyslipidemia Statin Lipid panel   Type 2 diabetes mellitus without complications (HCC) SSI     No concerns based on BMI: Body mass index is 23.88 kg/m.  Underweight - under 18  overweight - 25 to 29 obese - 30 or more Class 1 obesity: BMI of 30.0 to 34 Class 2 obesity: BMI of 35.0 to 39 Class 3 obesity: BMI of 40.0 to 49 Super Morbid Obesity: BMI 50-59 Super-super Morbid Obesity: BMI 60+ Significantly low or high BMI is associated with higher medical risk.  Weight management advised as adjunct to other disease management and risk reduction treatments    DVT prophylaxis: lovenox  IV fluids: reduce continuous IV fluids but remain on this while NPO  Nutrition: aspiration precautions  Central lines / invasive devices: none  Code Status: FULL CODE ACP documentation reviewed:  none on file in [facility]  TOC needs: TBD, expect may need rehab if significant neuro deficits  Barriers to dispo / significant pending items: eval CVA< possible seizure, will need neuro clearance prior to DC, expect will be here couple more days              Subjective /  Brief ROS:  Patient is alert, she is occasionally peaking clearly but more often than not is exhibiting incoherent speech, word salad  Reports headache Family confirms above history    Family Communication: family is at bedside on rounds     Objective Findings:  Vitals:   05/09/23 0514 05/09/23 0622 05/09/23 0835 05/09/23 1149  BP: (!) 218/85 (!) 225/85 (!) 189/74 (!) 173/67  Pulse:  64 82 74  Resp:   17 17  Temp:   98.4 F (36.9 C) 99.4 F (37.4 C)  TempSrc:       SpO2:  96% 96% 93%  Weight:      Height:       No intake or output data in the 24 hours ending 05/09/23 1202 Filed Weights   05/09/23 0156  Weight: 63.1 kg    Examination:  Physical Exam Constitutional:      General: She is not in acute distress. Cardiovascular:     Rate and Rhythm: Normal rate and regular rhythm.  Pulmonary:     Effort: Pulmonary effort is normal.     Breath sounds: Normal breath sounds.  Abdominal:     Palpations: Abdomen is soft.  Musculoskeletal:     Right lower leg: No edema.     Left lower leg: No edema.  Skin:    General: Skin is warm and dry.  Neurological:     Mental Status: She is alert.     Motor: Weakness present.     Coordination: Coordination abnormal.  Psychiatric:        Behavior: Behavior normal.          Scheduled Medications:   [START ON 05/10/2023]  stroke: early stages of recovery book   Does not apply Once   acetaminophen   1,000 mg Oral Once   aspirin  EC  81 mg Oral Daily   atorvastatin   10 mg Oral Daily   calcium -vitamin D   1 tablet Oral Q breakfast   cholecalciferol   1,000 Units Oral Daily   cloNIDine   0.2 mg Oral Once   cloNIDine   0.2 mg Oral TID   clopidogrel   75 mg Oral Daily   cyanocobalamin   500 mcg Oral Daily   doxazosin   4 mg Oral Daily   enoxaparin  (LOVENOX ) injection  30 mg Subcutaneous Q24H   ferrous sulfate   325 mg Oral Daily   irbesartan   75 mg Oral Daily   metoCLOPramide  (REGLAN ) injection  10 mg Intravenous Once   metoprolol  succinate  100 mg Oral Daily    Continuous Infusions:  sodium chloride  100 mL/hr at 05/09/23 0441   valproate sodium       PRN Medications:  acetaminophen  **OR** acetaminophen , enalaprilat , hydrALAZINE , magnesium  hydroxide, nitroGLYCERIN , ondansetron  **OR** ondansetron  (ZOFRAN ) IV, traZODone   Antimicrobials from admission:  Anti-infectives (From admission, onward)    None           Data Reviewed:  I have personally reviewed the following...  CBC: Recent Labs   Lab 05/09/23 0043 05/09/23 0443  WBC 5.9 6.6  NEUTROABS 3.0  --   HGB 10.5* 11.4*  HCT 32.3* 35.7*  MCV 95.3 94.2  PLT 145* 143*   Basic Metabolic Panel: Recent Labs  Lab 05/09/23 0043 05/09/23 0443  NA 138 138  K 3.6 3.6  CL 99 102  CO2 26 21*  GLUCOSE 102* 96  BUN 22 19  CREATININE 1.19* 1.18*  CALCIUM  9.2 9.2   GFR: Estimated Creatinine Clearance: 30.1 mL/min (A) (by C-G formula based on SCr of 1.18  mg/dL (H)). Liver Function Tests: Recent Labs  Lab 05/09/23 0043  AST 18  ALT 8  ALKPHOS 47  BILITOT 1.0  PROT 7.9  ALBUMIN 3.9   No results for input(s): LIPASE, AMYLASE in the last 168 hours. No results for input(s): AMMONIA in the last 168 hours. Coagulation Profile: Recent Labs  Lab 05/09/23 0043  INR 1.1   Cardiac Enzymes: No results for input(s): CKTOTAL, CKMB, CKMBINDEX, TROPONINI in the last 168 hours. BNP (last 3 results) No results for input(s): PROBNP in the last 8760 hours. HbA1C: No results for input(s): HGBA1C in the last 72 hours. CBG: Recent Labs  Lab 05/09/23 0030 05/09/23 0435  GLUCAP 99 96   Lipid Profile: Recent Labs    05/09/23 0443  CHOL 238*  HDL 59  LDLCALC 167*  TRIG 61  CHOLHDL 4.0   Thyroid  Function Tests: No results for input(s): TSH, T4TOTAL, FREET4, T3FREE, THYROIDAB in the last 72 hours. Anemia Panel: No results for input(s): VITAMINB12, FOLATE, FERRITIN, TIBC, IRON, RETICCTPCT in the last 72 hours. Most Recent Urinalysis On File:     Component Value Date/Time   COLORURINE YELLOW (A) 07/13/2020 2039   APPEARANCEUR CLEAR (A) 07/13/2020 2039   LABSPEC 1.013 07/13/2020 2039   PHURINE 7.0 07/13/2020 2039   GLUCOSEU >=500 (A) 07/13/2020 2039   HGBUR SMALL (A) 07/13/2020 2039   BILIRUBINUR neg 07/11/2021 1017   KETONESUR 80 (A) 07/13/2020 2039   PROTEINUR Negative 07/11/2021 1017   PROTEINUR 100 (A) 07/13/2020 2039   UROBILINOGEN 0.2 07/11/2021 1017   NITRITE neg  07/11/2021 1017   NITRITE NEGATIVE 07/13/2020 2039   LEUKOCYTESUR Negative 07/11/2021 1017   LEUKOCYTESUR NEGATIVE 07/13/2020 2039   Sepsis Labs: @LABRCNTIP (procalcitonin:4,lacticidven:4) Microbiology: No results found for this or any previous visit (from the past 240 hours).    Radiology Studies last 3 days: CT ANGIO HEAD NECK W WO CM Result Date: 05/09/2023 CLINICAL DATA:  88 year old female neurologic deficit, TIA, found confused at home. EXAM: CT ANGIOGRAPHY HEAD AND NECK WITH CONTRAST TECHNIQUE: Multidetector CT imaging of the head and neck was performed using the standard protocol during bolus administration of intravenous contrast. Multiplanar CT image reconstructions and MIPs were obtained to evaluate the vascular anatomy. Carotid stenosis measurements (when applicable) are obtained utilizing NASCET criteria, using the distal internal carotid diameter as the denominator. RADIATION DOSE REDUCTION: This exam was performed according to the departmental dose-optimization program which includes automated exposure control, adjustment of the mA and/or kV according to patient size and/or use of iterative reconstruction technique. CONTRAST:  75mL OMNIPAQUE  IOHEXOL  350 MG/ML SOLN COMPARISON:  Brain MRI and head CT this morning. FINDINGS: CTA NECK Skeleton: Mild upper thoracic compression fractures are age indeterminate. Mild for age cervical spine degeneration. Upper chest: Mild dependent atelectasis, otherwise negative. Other neck: Neck soft tissue spaces are within normal limits. Aortic arch: Tortuous 3 vessel arch. Calcified aortic atherosclerosis. Right carotid system: Tortuous brachiocephalic artery and right CCA. Patent right carotid bifurcation. Tortuous right ICA distal to the bulb. No significant atherosclerosis or stenosis. Left carotid system: Tortuous left CCA origin with mild calcified plaque and no stenosis. Patent left carotid bifurcation. Tortuous left ICA distal to the bulb. No significant  plaque or stenosis. Vertebral arteries: Mildly tortuous proximal right subclavian artery. Tortuous right vertebral artery origin and late entry of the right vertebral artery into the cervical transverse foramen. The right vertebral is also non dominant, remains highly tortuous in the neck but is patent to the skull base with no significant  plaque or stenosis. Proximal left subclavian artery soft and calcified plaque without stenosis. Normal left vertebral artery origin. Tortuous left V1 segment. Dominant left vertebral artery with intermittent V2 tortuosity is patent to the skull base with no significant plaque or stenosis. CTA HEAD Posterior circulation: Dominant left and diminutive right distal vertebral arteries and vertebrobasilar junction are patent without plaque or stenosis. Normal right PICA origin. Dominant appearing left AICA is patent. Patent basilar artery without stenosis. Patent SCA and left PCA origins. Fetal type right PCA origin. Left posterior communicating artery diminutive or absent. Bilateral PCA branches are within normal limits, more tortuous on the right. Anterior circulation: Both ICA siphons are patent. Tortuous left ICA siphon with mild calcified plaque and no stenosis. There is an infundibulum of the distal left ICA on series 8, image 82 (normal variant). Contralateral tortuous right ICA siphon with moderate calcified plaque. But ectatic vessel with no significant stenosis. Normal right posterior communicating artery origin. Patent carotid termini, MCA and ACA origins. Tortuous A1 segments. Anterior communicating artery is within normal limits. Bilateral ACA branches are within normal limits, tortuous. Left MCA M1 segment and trifurcation are patent without stenosis. Right MCA M1 segment and bifurcation are patent without stenosis. Bilateral MCA branches are within normal limits. Venous sinuses: Early contrast timing, grossly patent. Anatomic variants: Dominant left vertebral artery, the  right is diminutive. Fetal type right PCA origin. Review of the MIP images confirms the above findings IMPRESSION: 1. Negative for large vessel occlusion. 2. Generalized arterial tortuosity in the head and neck. No significant extracranial atherosclerosis. Right greater than left ICA siphon calcified plaque, but no hemodynamically significant stenosis. 3.  Aortic Atherosclerosis (ICD10-I70.0). 4. Mild age indeterminate upper thoracic spine compression fractures. Electronically Signed   By: VEAR Hurst M.D.   On: 05/09/2023 07:50   MR BRAIN WO CONTRAST Result Date: 05/09/2023 CLINICAL DATA:  88 year old female neurologic deficit, TIA, found confused at home. EXAM: MRI HEAD WITHOUT CONTRAST TECHNIQUE: Multiplanar, multiecho pulse sequences of the brain and surrounding structures were obtained without intravenous contrast. COMPARISON:  Head CT 0047 hours. FINDINGS: Brain: Cerebral volume is within normal limits for age. No restricted diffusion to suggest acute infarction. No midline shift, mass effect, evidence of mass lesion, ventriculomegaly, extra-axial collection or acute intracranial hemorrhage. Cervicomedullary junction and pituitary are within normal limits. Patchy, periventricular and scattered bilateral cerebral white matter T2 and FLAIR hyperintensity. No cortical encephalomalacia identified. No chronic cerebral blood products on SWI. Mild deep white matter capsule involvement. Deep gray nuclei, brainstem, and cerebellum are within normal limits for age. Vascular: Major intracranial vascular flow voids are preserved. Some generalized intracranial artery tortuosity. Skull and upper cervical spine: Negative for age visible cervical spine. Visualized bone marrow signal is within normal limits. Sinuses/Orbits: Postoperative changes to the globes, otherwise negative orbits. Left maxillary sinus mucous retention cysts. Paranasal sinuses and mastoids stable and well aerated. Other: Grossly negative visible internal  auditory structures. Negative visible scalp and face. IMPRESSION: 1. No acute intracranial abnormality. 2. Moderate for age cerebral white matter signal changes, most commonly due to chronic small vessel disease. Electronically Signed   By: VEAR Hurst M.D.   On: 05/09/2023 07:04   CT HEAD WO CONTRAST Result Date: 05/09/2023 CLINICAL DATA:  Neuro deficit, acute, stroke suspected. Patient's daughter indicated the patient had altered mental status yesterday evening, with left orbital left cranial pain. EXAM: CT HEAD WITHOUT CONTRAST TECHNIQUE: Contiguous axial images were obtained from the base of the skull through the vertex without intravenous  contrast. RADIATION DOSE REDUCTION: This exam was performed according to the departmental dose-optimization program which includes automated exposure control, adjustment of the mA and/or kV according to patient size and/or use of iterative reconstruction technique. COMPARISON:  None Available. FINDINGS: Brain: There is mild global atrophy and mild small-vessel disease of the cerebral white matter. The ventricles are normal in size and position. No asymmetry is seen worrisome for acute cortical based infarct, hemorrhage, mass or mass effect. Basal cisterns are clear. No old territorial infarcts. Vascular: Scattered calcific plaque both siphons. No hyperdense central vasculature. Skull: Negative for fractures or focal lesions. Hyperostosis frontalis interna. Sinuses/Orbits: No acute abnormality. Old lens extractions. Clear visualized sinuses and mastoids. Other: None. IMPRESSION: 1. No acute intracranial CT findings. 2. Mild for age atrophy and small-vessel disease. Electronically Signed   By: Francis Quam M.D.   On: 05/09/2023 01:24       Time spent: 35 min     Symphani Eckstrom, DO [facility] 05/09/2023, 12:02 PM    Dictation software may have been used to generate the above note. Typos may occur and escape review in typed/dictated notes. Please contact  Dr Marsa directly for clarity if needed.  Staff may message me via secure chat in Epic  but this may not receive an immediate response,  please page me for urgent matters!  If 7PM-7AM, please contact night coverage [email or web address]

## 2023-05-09 NOTE — Progress Notes (Signed)
   05/09/23 0454  Spiritual Encounters  Type of Visit Initial  Care provided to: Pt and family  Conversation partners present during encounter Nurse  Referral source Code page  Reason for visit Code  OnCall Visit Yes  Interventions  Spiritual Care Interventions Made Established relationship of care and support  Intervention Outcomes  Outcomes Connection to spiritual care  Spiritual Care Plan  Spiritual Care Issues Still Outstanding No further spiritual care needs at this time (see row info)

## 2023-05-09 NOTE — Assessment & Plan Note (Addendum)
-   The patient will be placed on supplemental coverage with NovoLog. - We will hold off metformin. 

## 2023-05-09 NOTE — Assessment & Plan Note (Signed)
-   We will continue antihypertensive therapy when able to take p.o. due to current dysphagia. - We will allow permissive hypertension.  As needed IV hydralazine .

## 2023-05-10 ENCOUNTER — Observation Stay
Admit: 2023-05-10 | Discharge: 2023-05-10 | Disposition: A | Discharge status: Home / Self Care | Payer: Medicare HMO | Attending: Family Medicine

## 2023-05-10 ENCOUNTER — Ambulatory Visit: Payer: Medicare HMO

## 2023-05-10 ENCOUNTER — Inpatient Hospital Stay: Payer: Medicare HMO

## 2023-05-10 DIAGNOSIS — D649 Anemia, unspecified: Secondary | ICD-10-CM | POA: Diagnosis present

## 2023-05-10 DIAGNOSIS — G43109 Migraine with aura, not intractable, without status migrainosus: Secondary | ICD-10-CM | POA: Diagnosis present

## 2023-05-10 DIAGNOSIS — G459 Transient cerebral ischemic attack, unspecified: Secondary | ICD-10-CM | POA: Diagnosis not present

## 2023-05-10 DIAGNOSIS — J341 Cyst and mucocele of nose and nasal sinus: Secondary | ICD-10-CM | POA: Diagnosis present

## 2023-05-10 DIAGNOSIS — Z888 Allergy status to other drugs, medicaments and biological substances status: Secondary | ICD-10-CM | POA: Diagnosis not present

## 2023-05-10 DIAGNOSIS — I10 Essential (primary) hypertension: Secondary | ICD-10-CM | POA: Diagnosis present

## 2023-05-10 DIAGNOSIS — E119 Type 2 diabetes mellitus without complications: Secondary | ICD-10-CM | POA: Diagnosis present

## 2023-05-10 DIAGNOSIS — R4701 Aphasia: Secondary | ICD-10-CM | POA: Diagnosis present

## 2023-05-10 DIAGNOSIS — I63532 Cerebral infarction due to unspecified occlusion or stenosis of left posterior cerebral artery: Secondary | ICD-10-CM | POA: Diagnosis present

## 2023-05-10 DIAGNOSIS — R1310 Dysphagia, unspecified: Secondary | ICD-10-CM | POA: Diagnosis present

## 2023-05-10 DIAGNOSIS — E039 Hypothyroidism, unspecified: Secondary | ICD-10-CM | POA: Diagnosis present

## 2023-05-10 DIAGNOSIS — R4182 Altered mental status, unspecified: Secondary | ICD-10-CM | POA: Diagnosis present

## 2023-05-10 DIAGNOSIS — E785 Hyperlipidemia, unspecified: Secondary | ICD-10-CM | POA: Diagnosis present

## 2023-05-10 DIAGNOSIS — Z833 Family history of diabetes mellitus: Secondary | ICD-10-CM | POA: Diagnosis not present

## 2023-05-10 DIAGNOSIS — Z79899 Other long term (current) drug therapy: Secondary | ICD-10-CM | POA: Diagnosis not present

## 2023-05-10 DIAGNOSIS — R471 Dysarthria and anarthria: Secondary | ICD-10-CM | POA: Diagnosis present

## 2023-05-10 DIAGNOSIS — R29707 NIHSS score 7: Secondary | ICD-10-CM | POA: Diagnosis not present

## 2023-05-10 DIAGNOSIS — R29708 NIHSS score 8: Secondary | ICD-10-CM | POA: Diagnosis not present

## 2023-05-10 DIAGNOSIS — D696 Thrombocytopenia, unspecified: Secondary | ICD-10-CM | POA: Diagnosis present

## 2023-05-10 DIAGNOSIS — R29711 NIHSS score 11: Secondary | ICD-10-CM | POA: Diagnosis not present

## 2023-05-10 DIAGNOSIS — R29702 NIHSS score 2: Secondary | ICD-10-CM | POA: Diagnosis not present

## 2023-05-10 DIAGNOSIS — I161 Hypertensive emergency: Secondary | ICD-10-CM | POA: Diagnosis present

## 2023-05-10 DIAGNOSIS — Z8673 Personal history of transient ischemic attack (TIA), and cerebral infarction without residual deficits: Secondary | ICD-10-CM | POA: Diagnosis present

## 2023-05-10 DIAGNOSIS — Z7984 Long term (current) use of oral hypoglycemic drugs: Secondary | ICD-10-CM | POA: Diagnosis not present

## 2023-05-10 DIAGNOSIS — R29700 NIHSS score 0: Secondary | ICD-10-CM | POA: Diagnosis present

## 2023-05-10 DIAGNOSIS — Z7982 Long term (current) use of aspirin: Secondary | ICD-10-CM | POA: Diagnosis not present

## 2023-05-10 DIAGNOSIS — I639 Cerebral infarction, unspecified: Secondary | ICD-10-CM | POA: Diagnosis present

## 2023-05-10 DIAGNOSIS — R4702 Dysphasia: Secondary | ICD-10-CM | POA: Diagnosis present

## 2023-05-10 LAB — GLUCOSE, CAPILLARY: Glucose-Capillary: 91 mg/dL (ref 70–99)

## 2023-05-10 LAB — MAGNESIUM: Magnesium: 1.7 mg/dL (ref 1.7–2.4)

## 2023-05-10 LAB — ECHOCARDIOGRAM COMPLETE BUBBLE STUDY
AR max vel: 2.51 cm2
AV Area VTI: 2.65 cm2
AV Area mean vel: 2.45 cm2
AV Mean grad: 6 mm[Hg]
AV Peak grad: 12.1 mm[Hg]
Ao pk vel: 1.74 m/s
Area-P 1/2: 2.13 cm2
MV VTI: 2.84 cm2
S' Lateral: 2.3 cm

## 2023-05-10 LAB — VITAMIN B12: Vitamin B-12: 3369 pg/mL — ABNORMAL HIGH (ref 180–914)

## 2023-05-10 MED ORDER — ENSURE ENLIVE PO LIQD
237.0000 mL | Freq: Two times a day (BID) | ORAL | Status: DC
  Administered 2023-05-10 – 2023-05-11 (×3): 237 mL via ORAL

## 2023-05-10 NOTE — Progress Notes (Signed)
 PROGRESS NOTE    Amber Padilla   NUU:[id number] DOB: September 21, 1935  DOA: 05/09/2023 Date of Service: 05/10/23 which is hospital day 0  PCP: Sheron Dixons, MD    Hospital course / significant events:   HPI: Amber Padilla is a 88 y.o. female with medical history significant for type 2 diabetes mellitus, hypertension, hypothyroidism and dyslipidemia, who presented to the emergency room with acute onset of expressive dysphasia, she was in her normal state of health around 5 PM with her daughter watching TV and talking to her as her normal.  Her daughter left and around 70 PM and the patient texted her incoherently, first with jumbled letters and then with random words that did not piece together to make any sense.  The daughter went to check on her mother around 49 PM and she did not answer the door.  She appeared confused and was having word salad as described per her daughter who is an Charity fundraiser.  She elaborated that she was training words together that were completely nonsensical however she appeared unfazed by her word choice.  This resolved by the time she came to the ER and her only complaint was left-sided headache   02/09: to ED. Symptoms resolved at time of admission and MRI no acute CVA however alter in the morning expressive aphasia recurred, as well as coordination difficulty w/ upper extremities, describing L frontal/sinus headache (note no sinus infection on imaging but she does have cyst in maxillary sinus on L). Neurology recs - hold repeat MRI but would consider tomorrow, will get EEG, given headache will rx migraine cocktail w/ reglan  + depakote in case seizure. Continue DAPT, statin  02/10: Echo done, pending read. EEG not done today. Speech still slurred - will repeat MRI brain       Consultants:  Neurology   Procedures/Surgeries: none      ASSESSMENT & PLAN:   TIA (transient ischemic attack) w/ recurrent symptoms - Ddx evolving or stuttering CVA, compliacted migraine,  focal seizure, HTN encephalopathy Per neurology consult note: "Though her MRI was negative, her symptoms had improved around the time of her MRI, and it is possible that a stuttering thalamic lacunar can give aphasia in the setting. Complicated migraine given her history of headaches as well as her left-sided unilateral headache would be another consideration. Seizure can be another cause of focal neurological dysfunction, but... less likely. She is afebrile and does not have any meningismus making infection less likely. Her blood pressures are exceedingly high and therefore I think both hypertensive encephalopathy would also be a consideration." Still slurring speech today, word salad per family, she is repeating herself a lot  Appreciate neurology recs  neuro checks q.4 hours for 24 hours Permissive HTN DAPT, statin Echo pending read Continue telemetry  repeat MRI since symptoms not improving  EEG pending getting done  Migraine cocktail yesterday w/ Reglan  10 mg x 1 + Depacon  1500 mg x 1  B12, TSH, ammonia    Hypertensive urgency - may explain headache also  Record review - BP typically is well controlled, family does not think pt has missed her usual meds  Permissive HTN per neurology   Dyslipidemia Statin Lipid panel   Type 2 diabetes mellitus without complications (HCC) SSI     No concerns based on BMI: Body mass index is 23.88 kg/m.  Underweight - under 18  overweight - 25 to 29 obese - 30 or more Class 1 obesity: BMI of 30.0 to 34 Class 2 obesity:  BMI of 35.0 to 39 Class 3 obesity: BMI of 40.0 to 49 Super Morbid Obesity: BMI 50-59 Super-super Morbid Obesity: BMI 60+ Significantly low or high BMI is associated with higher medical risk.  Weight management advised as adjunct to other disease management and risk reduction treatments    DVT prophylaxis: lovenox  IV fluids: reduce continuous IV fluids but remain on this while NPO  Nutrition: aspiration precautions. Regular  diet  Central lines / invasive devices: none  Code Status: FULL CODE ACP documentation reviewed:  none on file in [facility]  TOC needs: home health Barriers to dispo / significant pending items: eval CVA, possible seizure, will need neuro clearance prior to DC, expect will be here until Echo, EEG, MRI are done which did not happen today              Subjective / Brief ROS:  Patient is alert, she is still exhibiting incoherent speech, word salad  No headache at this time Family confirms above - expresses that she is still not talking at her baseline    Family Communication: family is at bedside on rounds     Objective Findings:  Vitals:   05/10/23 0125 05/10/23 0403 05/10/23 0759 05/10/23 1157  BP: (!) 185/74 (!) 178/67 (!) 167/61 (!) 141/62  Pulse: 65 67 63 61  Resp: 19 16 18 16   Temp: 98.2 F (36.8 C) 98.4 F (36.9 C) 98.6 F (37 C) (!) 97.5 F (36.4 C)  TempSrc:      SpO2: 94% 93% 94% 97%  Weight:      Height:        Intake/Output Summary (Last 24 hours) at 05/10/2023 1644 Last data filed at 05/10/2023 0348 Gross per 24 hour  Intake 622.53 ml  Output --  Net 622.53 ml   Filed Weights   05/09/23 0156  Weight: 63.1 kg    Examination:  Physical Exam Constitutional:      General: She is not in acute distress. Cardiovascular:     Rate and Rhythm: Normal rate and regular rhythm.  Pulmonary:     Effort: Pulmonary effort is normal.     Breath sounds: Normal breath sounds.  Abdominal:     Palpations: Abdomen is soft.  Musculoskeletal:     Right lower leg: No edema.     Left lower leg: No edema.  Skin:    General: Skin is warm and dry.  Neurological:     Mental Status: She is alert.     Motor: No weakness.     Coordination: Coordination abnormal (seems a bit better today finger/nose and heel/shin but still no smooth movements).  Psychiatric:        Behavior: Behavior normal.          Scheduled Medications:    stroke: early stages of  recovery book   Does not apply Once   acetaminophen   1,000 mg Oral Once   aspirin  EC  81 mg Oral Daily   atorvastatin   10 mg Oral Daily   calcium -vitamin D   1 tablet Oral Q breakfast   cholecalciferol   1,000 Units Oral Daily   cloNIDine   0.2 mg Oral Once   cloNIDine   0.2 mg Oral TID   clopidogrel   75 mg Oral Daily   cyanocobalamin   500 mcg Oral Daily   doxazosin   4 mg Oral Daily   enoxaparin  (LOVENOX ) injection  30 mg Subcutaneous Q24H   feeding supplement  237 mL Oral BID BM   ferrous sulfate   325 mg  Oral Daily   irbesartan   75 mg Oral Daily   metoprolol  succinate  100 mg Oral Daily    Continuous Infusions:    PRN Medications:  acetaminophen  **OR** acetaminophen , enalaprilat , hydrALAZINE , magnesium  hydroxide, nitroGLYCERIN , ondansetron  **OR** ondansetron  (ZOFRAN ) IV, traZODone   Antimicrobials from admission:  Anti-infectives (From admission, onward)    None           Data Reviewed:  I have personally reviewed the following...  CBC: Recent Labs  Lab 05/09/23 0043 05/09/23 0443  WBC 5.9 6.6  NEUTROABS 3.0  --   HGB 10.5* 11.4*  HCT 32.3* 35.7*  MCV 95.3 94.2  PLT 145* 143*   Basic Metabolic Panel: Recent Labs  Lab 05/09/23 0043 05/09/23 0443 05/10/23 0529  NA 138 138  --   K 3.6 3.6  --   CL 99 102  --   CO2 26 21*  --   GLUCOSE 102* 96  --   BUN 22 19  --   CREATININE 1.19* 1.18*  --   CALCIUM  9.2 9.2  --   MG  --   --  1.7   GFR: Estimated Creatinine Clearance: 30.1 mL/min (A) (by C-G formula based on SCr of 1.18 mg/dL (H)). Liver Function Tests: Recent Labs  Lab 05/09/23 0043  AST 18  ALT 8  ALKPHOS 47  BILITOT 1.0  PROT 7.9  ALBUMIN 3.9   No results for input(s): "LIPASE", "AMYLASE" in the last 168 hours. Recent Labs  Lab 05/09/23 1605  AMMONIA 36*   Coagulation Profile: Recent Labs  Lab 05/09/23 0043  INR 1.1   Cardiac Enzymes: No results for input(s): "CKTOTAL", "CKMB", "CKMBINDEX", "TROPONINI" in the last 168  hours. BNP (last 3 results) No results for input(s): "PROBNP" in the last 8760 hours. HbA1C: No results for input(s): "HGBA1C" in the last 72 hours. CBG: Recent Labs  Lab 05/09/23 0030 05/09/23 0435 05/10/23 0124  GLUCAP 99 96 91   Lipid Profile: Recent Labs    05/09/23 0443  CHOL 238*  HDL 59  LDLCALC 167*  TRIG 61  CHOLHDL 4.0   Thyroid  Function Tests: Recent Labs    05/09/23 1605  TSH 1.316   Anemia Panel: Recent Labs    05/09/23 1605  VITAMINB12 3,369*   Most Recent Urinalysis On File:     Component Value Date/Time   COLORURINE YELLOW (A) 05/09/2023 0934   APPEARANCEUR CLEAR (A) 05/09/2023 0934   LABSPEC 1.041 (H) 05/09/2023 0934   PHURINE 5.0 05/09/2023 0934   GLUCOSEU 50 (A) 05/09/2023 0934   HGBUR NEGATIVE 05/09/2023 0934   BILIRUBINUR NEGATIVE 05/09/2023 0934   BILIRUBINUR neg 07/11/2021 1017   KETONESUR 5 (A) 05/09/2023 0934   PROTEINUR NEGATIVE 05/09/2023 0934   UROBILINOGEN 0.2 07/11/2021 1017   NITRITE NEGATIVE 05/09/2023 0934   LEUKOCYTESUR NEGATIVE 05/09/2023 0934   Sepsis Labs: @LABRCNTIP (procalcitonin:4,lacticidven:4) Microbiology: No results found for this or any previous visit (from the past 240 hours).    Radiology Studies last 3 days: ECHOCARDIOGRAM COMPLETE BUBBLE STUDY Result Date: 05/10/2023    ECHOCARDIOGRAM REPORT   Patient Name:   NAIRA TEJADA Date of Exam: 05/10/2023 Medical Rec #:  [id number]      Height:       64.0 in Accession #:    [id number]     Weight:       139.1 lb Date of Birth:  [birth year]      BSA:          1.677 m Patient Age:  87 years       BP:           141/62 mmHg Patient Gender: F              HR:           54 bpm. Exam Location:  [facility] Procedure: 2D Echo, Cardiac Doppler, Color Doppler and Saline Contrast Bubble            Study Indications:     TIA  History:         Patient has prior history of Echocardiogram examinations, most                  recent 07/15/2020. TIA; Risk Factors:Hypertension, Diabetes and                   Dyslipidemia.  Sonographer:     Clarke Crouch Referring Phys:  [id number] JAN A MANSY Diagnosing Phys: Sabina Custovic IMPRESSIONS  1. Left ventricular ejection fraction, by estimation, is 60 to 65%. The left ventricle has normal function. The left ventricle has no regional wall motion abnormalities. Left ventricular diastolic parameters are consistent with Grade II diastolic dysfunction (pseudonormalization).  2. Right ventricular systolic function is normal. The right ventricular size is normal. There is moderately elevated pulmonary artery systolic pressure.  3. Left atrial size was severely dilated.  4. Right atrial size was mildly dilated.  5. The mitral valve is grossly normal. Mild to moderate mitral valve regurgitation. No evidence of mitral stenosis.  6. Tricuspid valve regurgitation is moderate.  7. The aortic valve is normal in structure. Aortic valve regurgitation is trivial. No aortic stenosis is present. FINDINGS  Left Ventricle: Left ventricular ejection fraction, by estimation, is 60 to 65%. The left ventricle has normal function. The left ventricle has no regional wall motion abnormalities. The left ventricular internal cavity size was normal in size. There is  no left ventricular hypertrophy. Left ventricular diastolic parameters are consistent with Grade II diastolic dysfunction (pseudonormalization). Right Ventricle: The right ventricular size is normal. No increase in right ventricular wall thickness. Right ventricular systolic function is normal. There is moderately elevated pulmonary artery systolic pressure. The tricuspid regurgitant velocity is 3.09 m/s, and with an assumed right atrial pressure of 8 mmHg, the estimated right ventricular systolic pressure is 46.2 mmHg. Left Atrium: Left atrial size was severely dilated. Right Atrium: Right atrial size was mildly dilated. Pericardium: There is no evidence of pericardial effusion. Mitral Valve: The mitral valve is grossly normal.  Mild to moderate mitral valve regurgitation. No evidence of mitral valve stenosis. MV peak gradient, 6.2 mmHg. The mean mitral valve gradient is 2.0 mmHg. Tricuspid Valve: The tricuspid valve is grossly normal. Tricuspid valve regurgitation is moderate. Aortic Valve: The aortic valve is normal in structure. Aortic valve regurgitation is trivial. No aortic stenosis is present. Aortic valve mean gradient measures 6.0 mmHg. Aortic valve peak gradient measures 12.1 mmHg. Aortic valve area, by VTI measures 2.65 cm. Pulmonic Valve: The pulmonic valve was grossly normal. Pulmonic valve regurgitation is not visualized. Aorta: The aortic root is normal in size and structure. IAS/Shunts: No atrial level shunt detected by color flow Doppler. Agitated saline contrast was given intravenously to evaluate for intracardiac shunting.  LEFT VENTRICLE PLAX 2D LVIDd:         5.00 cm   Diastology LVIDs:         2.30 cm   LV e' medial:    5.11 cm/s LV PW:  1.30 cm   LV E/e' medial:  16.5 LV IVS:        1.50 cm   LV e' lateral:   5.87 cm/s LVOT diam:     1.90 cm   LV E/e' lateral: 14.4 LV SV:         115 LV SV Index:   68 LVOT Area:     2.84 cm  RIGHT VENTRICLE RV Basal diam:  3.30 cm RV Mid diam:    3.30 cm RV S prime:     14.00 cm/s TAPSE (M-mode): 3.0 cm LEFT ATRIUM             Index        RIGHT ATRIUM           Index LA diam:        5.60 cm 3.34 cm/m   RA Area:     16.10 cm LA Vol (A2C):   92.9 ml 55.41 ml/m  RA Volume:   40.90 ml  24.39 ml/m LA Vol (A4C):   97.1 ml 57.91 ml/m LA Biplane Vol: 98.0 ml 58.45 ml/m  AORTIC VALVE                     PULMONIC VALVE AV Area (Vmax):    2.51 cm      PV Vmax:       1.32 m/s AV Area (Vmean):   2.45 cm      PV Peak grad:  7.0 mmHg AV Area (VTI):     2.65 cm AV Vmax:           174.00 cm/s AV Vmean:          111.000 cm/s AV VTI:            0.433 m AV Peak Grad:      12.1 mmHg AV Mean Grad:      6.0 mmHg LVOT Vmax:         154.00 cm/s LVOT Vmean:        96.000 cm/s LVOT VTI:           0.405 m LVOT/AV VTI ratio: 0.94  AORTA Ao Root diam: 2.90 cm MITRAL VALVE                TRICUSPID VALVE MV Area (PHT): 2.13 cm     TR Peak grad:   38.2 mmHg MV Area VTI:   2.84 cm     TR Vmax:        309.00 cm/s MV Peak grad:  6.2 mmHg MV Mean grad:  2.0 mmHg     SHUNTS MV Vmax:       1.24 m/s     Systemic VTI:  0.40 m MV Vmean:      58.4 cm/s    Systemic Diam: 1.90 cm MV Decel Time: 356 msec MV E velocity: 84.40 cm/s MV A velocity: 113.00 cm/s MV E/A ratio:  0.75 Designer, multimedia signed by Isabell Manzanilla Signature Date/Time: 05/10/2023/4:38:16 PM    Final    CT ANGIO HEAD NECK W WO CM Result Date: 05/09/2023 CLINICAL DATA:  88 year old female neurologic deficit, TIA, found confused at home. EXAM: CT ANGIOGRAPHY HEAD AND NECK WITH CONTRAST TECHNIQUE: Multidetector CT imaging of the head and neck was performed using the standard protocol during bolus administration of intravenous contrast. Multiplanar CT image reconstructions and MIPs were obtained to evaluate the vascular anatomy. Carotid stenosis measurements (when applicable) are obtained utilizing NASCET criteria, using the distal  internal carotid diameter as the denominator. RADIATION DOSE REDUCTION: This exam was performed according to the departmental dose-optimization program which includes automated exposure control, adjustment of the mA and/or kV according to patient size and/or use of iterative reconstruction technique. CONTRAST:  75mL OMNIPAQUE  IOHEXOL  350 MG/ML SOLN COMPARISON:  Brain MRI and head CT this morning. FINDINGS: CTA NECK Skeleton: Mild upper thoracic compression fractures are age indeterminate. Mild for age cervical spine degeneration. Upper chest: Mild dependent atelectasis, otherwise negative. Other neck: Neck soft tissue spaces are within normal limits. Aortic arch: Tortuous 3 vessel arch. Calcified aortic atherosclerosis. Right carotid system: Tortuous brachiocephalic artery and right CCA. Patent right carotid  bifurcation. Tortuous right ICA distal to the bulb. No significant atherosclerosis or stenosis. Left carotid system: Tortuous left CCA origin with mild calcified plaque and no stenosis. Patent left carotid bifurcation. Tortuous left ICA distal to the bulb. No significant plaque or stenosis. Vertebral arteries: Mildly tortuous proximal right subclavian artery. Tortuous right vertebral artery origin and late entry of the right vertebral artery into the cervical transverse foramen. The right vertebral is also non dominant, remains highly tortuous in the neck but is patent to the skull base with no significant plaque or stenosis. Proximal left subclavian artery soft and calcified plaque without stenosis. Normal left vertebral artery origin. Tortuous left V1 segment. Dominant left vertebral artery with intermittent V2 tortuosity is patent to the skull base with no significant plaque or stenosis. CTA HEAD Posterior circulation: Dominant left and diminutive right distal vertebral arteries and vertebrobasilar junction are patent without plaque or stenosis. Normal right PICA origin. Dominant appearing left AICA is patent. Patent basilar artery without stenosis. Patent SCA and left PCA origins. Fetal type right PCA origin. Left posterior communicating artery diminutive or absent. Bilateral PCA branches are within normal limits, more tortuous on the right. Anterior circulation: Both ICA siphons are patent. Tortuous left ICA siphon with mild calcified plaque and no stenosis. There is an infundibulum of the distal left ICA on series 8, image 82 (normal variant). Contralateral tortuous right ICA siphon with moderate calcified plaque. But ectatic vessel with no significant stenosis. Normal right posterior communicating artery origin. Patent carotid termini, MCA and ACA origins. Tortuous A1 segments. Anterior communicating artery is within normal limits. Bilateral ACA branches are within normal limits, tortuous. Left MCA M1 segment  and trifurcation are patent without stenosis. Right MCA M1 segment and bifurcation are patent without stenosis. Bilateral MCA branches are within normal limits. Venous sinuses: Early contrast timing, grossly patent. Anatomic variants: Dominant left vertebral artery, the right is diminutive. Fetal type right PCA origin. Review of the MIP images confirms the above findings IMPRESSION: 1. Negative for large vessel occlusion. 2. Generalized arterial tortuosity in the head and neck. No significant extracranial atherosclerosis. Right greater than left ICA siphon calcified plaque, but no hemodynamically significant stenosis. 3.  Aortic Atherosclerosis (ICD10-I70.0). 4. Mild age indeterminate upper thoracic spine compression fractures. Electronically Signed   By: Marlise Simpers M.D.   On: 05/09/2023 07:50   MR BRAIN WO CONTRAST Result Date: 05/09/2023 CLINICAL DATA:  88 year old female neurologic deficit, TIA, found confused at home. EXAM: MRI HEAD WITHOUT CONTRAST TECHNIQUE: Multiplanar, multiecho pulse sequences of the brain and surrounding structures were obtained without intravenous contrast. COMPARISON:  Head CT 0047 hours. FINDINGS: Brain: Cerebral volume is within normal limits for age. No restricted diffusion to suggest acute infarction. No midline shift, mass effect, evidence of mass lesion, ventriculomegaly, extra-axial collection or acute intracranial hemorrhage. Cervicomedullary junction and pituitary  are within normal limits. Patchy, periventricular and scattered bilateral cerebral white matter T2 and FLAIR hyperintensity. No cortical encephalomalacia identified. No chronic cerebral blood products on SWI. Mild deep white matter capsule involvement. Deep gray nuclei, brainstem, and cerebellum are within normal limits for age. Vascular: Major intracranial vascular flow voids are preserved. Some generalized intracranial artery tortuosity. Skull and upper cervical spine: Negative for age visible cervical spine.  Visualized bone marrow signal is within normal limits. Sinuses/Orbits: Postoperative changes to the globes, otherwise negative orbits. Left maxillary sinus mucous retention cysts. Paranasal sinuses and mastoids stable and well aerated. Other: Grossly negative visible internal auditory structures. Negative visible scalp and face. IMPRESSION: 1. No acute intracranial abnormality. 2. Moderate for age cerebral white matter signal changes, most commonly due to chronic small vessel disease. Electronically Signed   By: Marlise Simpers M.D.   On: 05/09/2023 07:04   CT HEAD WO CONTRAST Result Date: 05/09/2023 CLINICAL DATA:  Neuro deficit, acute, stroke suspected. Patient's daughter indicated the patient had altered mental status yesterday evening, with left orbital left cranial pain. EXAM: CT HEAD WITHOUT CONTRAST TECHNIQUE: Contiguous axial images were obtained from the base of the skull through the vertex without intravenous contrast. RADIATION DOSE REDUCTION: This exam was performed according to the departmental dose-optimization program which includes automated exposure control, adjustment of the mA and/or kV according to patient size and/or use of iterative reconstruction technique. COMPARISON:  None Available. FINDINGS: Brain: There is mild global atrophy and mild small-vessel disease of the cerebral white matter. The ventricles are normal in size and position. No asymmetry is seen worrisome for acute cortical based infarct, hemorrhage, mass or mass effect. Basal cisterns are clear. No old territorial infarcts. Vascular: Scattered calcific plaque both siphons. No hyperdense central vasculature. Skull: Negative for fractures or focal lesions. Hyperostosis frontalis interna. Sinuses/Orbits: No acute abnormality. Old lens extractions. Clear visualized sinuses and mastoids. Other: None. IMPRESSION: 1. No acute intracranial CT findings. 2. Mild for age atrophy and small-vessel disease. Electronically Signed   By: Denman Fischer  M.D.   On: 05/09/2023 01:24          Jessamine Barcia, DO [facility] 05/10/2023, 4:44 PM    Dictation software may have been used to generate the above note. Typos may occur and escape review in typed/dictated notes. Please contact Dr Authur Leghorn directly for clarity if needed.  Staff may message me via secure chat in Epic  but this may not receive an immediate response,  please page me for urgent matters!  If 7PM-7AM, please contact night coverage [email or web address]

## 2023-05-10 NOTE — Progress Notes (Signed)
 Physical Therapy Treatment Patient Details Name: Amber Padilla MRN: [id number] DOB: [birth year] Today's Date: 05/10/2023   History of Present Illness Pt is a 88 y.o. female presenting to [facility] ED with nonsensical speech and L-sided HA. MRI showed no acute CVA, later pt had increased expressive aphasia and coordination difficulties. Admitted with possible TIA, hypertensive urgency and dyslipidemia. PMH significant for HTN, HLD, & DM.    PT Comments  Pt received in bed, daughter at bedside. Discussed role of PT and benefits of increasing mobility. Pt able to maneuver to EOB without physical assist. Sit<>stand with SBA/CGA. Pt completed 54ft of gait training with HHA/MinA for balance due to slight unsteadiness. Decreased step length and cadence, no significant deficits noted. Overall, very pleasant and cooperative, does perseverate throughout session. Pt was able to count to 10 fairly consistently while performing exercises. Will continue to progress acutely.     If plan is discharge home, recommend the following: A little help with walking and/or transfers;Assist for transportation;Assistance with cooking/housework;Help with stairs or ramp for entrance   Can travel by private vehicle        Equipment Recommendations  None recommended by PT    Recommendations for Other Services       Precautions / Restrictions Precautions Precautions: Fall Restrictions Weight Bearing Restrictions Per Provider Order: No     Mobility  Bed Mobility Overal bed mobility: Needs Assistance Bed Mobility: Supine to Sit, Sit to Supine     Supine to sit: Supervision     General bed mobility comments: Pt able to remove heavy linen and maneuver to EOB without difficulty    Transfers Overall transfer level: Needs assistance Equipment used: None Transfers: Sit to/from Stand Sit to Stand: Contact guard assist, Min assist           General transfer comment: Increased time to coordinate tasks with  repeated cues    Ambulation/Gait Ambulation/Gait assistance: Min assist Gait Distance (Feet): 85 Feet Assistive device: 1 person hand held assist, None Gait Pattern/deviations: Step-to pattern, Decreased step length - left, Decreased step length - right Gait velocity: decr     General Gait Details: Directional cues required, slow cadence, slightly unsteady.   Stairs             Wheelchair Mobility     Tilt Bed    Modified Rankin (Stroke Patients Only)       Balance Overall balance assessment: Needs assistance Sitting-balance support: No upper extremity supported, Feet supported Sitting balance-Leahy Scale: Good     Standing balance support: Single extremity supported, During functional activity Standing balance-Leahy Scale: Fair Standing balance comment: Pt requires MinA/HHA during dynamic mobility and ambulation                            Cognition Arousal: Alert Behavior During Therapy: WFL for tasks assessed/performed Overall Cognitive Status: Impaired/Different from baseline Area of Impairment: Orientation, Attention, Memory, Following commands, Safety/judgement, Awareness, Problem solving                 Orientation Level: Disoriented to, Place, Time, Situation Current Attention Level: Focused   Following Commands: Follows one step commands inconsistently, Follows one step commands consistently   Awareness: Intellectual Problem Solving: Slow processing, Decreased initiation, Requires tactile cues, Requires verbal cues, Difficulty sequencing          Exercises General Exercises - Lower Extremity Ankle Circles/Pumps: AROM, Both, 15 reps Long Arc Quad: AROM, Both, 15 reps, Seated Hip  Flexion/Marching: AROM, Both, 10 reps, Seated    General Comments General comments (skin integrity, edema, etc.): Pt and daughter educated on role of PT and Importance of increasing mobility      Pertinent Vitals/Pain Pain Assessment Pain  Assessment: No/denies pain    Home Living Family/patient expects to be discharged to:: Private residence Living Arrangements: Alone Available Help at Discharge: Family Type of Home: House Home Access: Level entry       Home Layout: One level Home Equipment: Grab bars - tub/shower;Shower seat      Prior Function            PT Goals (current goals can now be found in the care plan section) Acute Rehab PT Goals Patient Stated Goal: regain baseline language fluency Progress towards PT goals: Progressing toward goals    Frequency    Min 1X/week      PT Plan      Co-evaluation              AM-PAC PT "6 Clicks" Mobility   Outcome Measure  Help needed turning from your back to your side while in a flat bed without using bedrails?: A Little Help needed moving from lying on your back to sitting on the side of a flat bed without using bedrails?: A Little Help needed moving to and from a bed to a chair (including a wheelchair)?: A Little Help needed standing up from a chair using your arms (e.g., wheelchair or bedside chair)?: A Little Help needed to walk in hospital room?: A Little Help needed climbing 3-5 steps with a railing? : A Lot 6 Click Score: 17    End of Session Equipment Utilized During Treatment: Gait belt Activity Tolerance: Patient tolerated treatment well;No increased pain Patient left: in chair;with call bell/phone within reach;with family/visitor present Nurse Communication: Mobility status PT Visit Diagnosis: Difficulty in walking, not elsewhere classified (R26.2);Unsteadiness on feet (R26.81);Other abnormalities of gait and mobility (R26.89);Muscle weakness (generalized) (M62.81);Other symptoms and signs involving the nervous system (R29.898)     Time: 1104-1150 PT Time Calculation (min) (ACUTE ONLY): 46 min  Charges:    $Gait Training: 8-22 mins $Therapeutic Exercise: 8-22 mins $Therapeutic Activity: 8-22 mins PT General Charges $$ ACUTE  PT VISIT: 1 Visit                    Melvyn Stagers, PTA  Diona Franklin 05/10/2023, 1:04 PM

## 2023-05-10 NOTE — Progress Notes (Signed)
 Eeg done

## 2023-05-10 NOTE — Plan of Care (Signed)
  Problem: Education: Goal: Knowledge of disease or condition will improve Outcome: Progressing   Problem: Ischemic Stroke/TIA Tissue Perfusion: Goal: Complications of ischemic stroke/TIA will be minimized Outcome: Progressing   Problem: Coping: Goal: Will verbalize positive feelings about self Outcome: Progressing   Problem: Self-Care: Goal: Ability to participate in self-care as condition permits will improve Outcome: Progressing   Problem: Nutrition: Goal: Risk of aspiration will decrease Outcome: Progressing   Problem: Clinical Measurements: Goal: Will remain free from infection Outcome: Progressing   Problem: Elimination: Goal: Will not experience complications related to bowel motility Outcome: Progressing   Problem: Pain Managment: Goal: General experience of comfort will improve and/or be controlled Outcome: Progressing   Problem: Safety: Goal: Ability to remain free from injury will improve Outcome: Progressing

## 2023-05-10 NOTE — Procedures (Signed)
 Routine EEG Report  Amber Padilla is a 88 y.o. female with a history of altered mental status who is undergoing an EEG to evaluate for seizures.  Report: This EEG was acquired with electrodes placed according to the International 10-20 electrode system (including Fp1, Fp2, F3, F4, C3, C4, P3, P4, O1, O2, T3, T4, T5, T6, A1, A2, Fz, Cz, Pz). The following electrodes were missing or displaced: none.  The occipital dominant rhythm was 8.5 Hz. This activity is reactive to stimulation. Drowsiness was manifested by background fragmentation; deeper stages of sleep were identified by K complexes and sleep spindles. There was no focal slowing. There were no interictal epileptiform discharges. There were no electrographic seizures identified. There was no abnormal response to photic stimulation or hyperventilation.   Impression: This EEG was obtained while awake and asleep and is normal.    Clinical Correlation: Normal EEGs, however, do not rule out epilepsy.  Greg Leaks, MD [facility] [phone]  If 7pm- 7am, please page neurology on call as listed in [facility].

## 2023-05-10 NOTE — Evaluation (Signed)
 Speech Language Pathology Evaluation Patient Details Name: Amber Padilla MRN: [id number] DOB: 18-Aug-1935 Today's Date: 05/10/2023 Time: 2595-6387 SLP Time Calculation (min) (ACUTE ONLY): 40 min  Problem List:  Patient Active Problem List   Diagnosis Date Noted   TIA (transient ischemic attack) 05/09/2023   Dyslipidemia 05/09/2023   Type 2 diabetes mellitus without complications (HCC) 05/09/2023   Proteinuria due to type 2 diabetes mellitus (HCC) 12/31/2021   Osteopenia determined by x-ray 07/04/2021   Horseshoe kidney with renal calculus 07/25/2020   Aortic atherosclerosis (HCC) 07/25/2020   Hypertensive urgency    Type II diabetes mellitus with complication (HCC) 12/28/2018   Essential hypertension 12/28/2018   Hyperlipidemia associated with type 2 diabetes mellitus (HCC) 12/28/2018   Past Medical History:  Past Medical History:  Diagnosis Date   Allergy    Diabetes mellitus without complication (HCC)    Hyperlipidemia    Hypertension    Hypertensive emergency 07/14/2020   Hypertensive urgency    Thyroid  disease    Past Surgical History:  Past Surgical History:  Procedure Laterality Date   BUNIONECTOMY Bilateral 2005   HPI:  Per neurology note: "Thereasa Padilla is a 88 y.o. female with hx of hypertension, hyperlipidemia, diabetes who presents with difficulty speaking.  On 2/08, she was texting with her family around 49 PM and her texts were incoherent.  That prompted her family to call but she was not making sense.  Her daughter went to check on her mother around 43 PM and appeared confused.  She was brought into the emergency department where her symptoms significantly improved.  She complained of a left-sided headache with photophobia during this time.  At approximately the same time she went for an MRI this (2/9) morning, her symptoms seem to recur and she has been having difficulty with speech ever since that time." MRI 05/09/23: No acute intracranial abnormality. Moderate for  age cerebral white matter signal changes, most commonly due to chronic small vessel disease. Per Neurology, considering stuttering thalamic lacunar vs complicated migraine vs hypertensive encephalopathy. Neuro continues to follow patient. Pt seen for bedside swallow assessment on 05/09/23 and cleared for regular solids with cut meats. Follow up completed today for linguistic assessment.   Assessment / Plan / Recommendation Clinical Impression  Pt seen for cognitive linguistic evaluation in the setting of acute onset of aphasia. Assessment consisting of pt/daughter interview and completion of portions of the Western Aphasia Battery- Bedside Form. Pt's verbal expression notable for severe perseveration (notably perseverative on her name for majority of session despite provision of visual/verbal redirection), intermittent echolalia of therapist prompt, and fluent jargon. Receptive language notable for intermittent accuracy for one step commands, with increased acc with repetition and visual/tactile cues. Verbal response to simple WH questions and yes/no questions limited secondary to verbal perseveration- specifically with response of "yes." Reading informally assessed with information from orientation board in room, with pt identifying correct information with visual cues. Pt held pen, but did not attempt to write when prompted. Further assessment is warranted for reading, writing, and cognition. Daughter present for duration of session, reporting pt has emerging awareness and frustration with language deficits. Education and demonstration provided for response verification, redirection for perseveration, and provision of visual/tactile cues to aid language. Daughter reported understanding. Recommend continued assessment to determine approximation to baseline and follow up recommendations while in house. Expect follow up SLP services at discharge. Pt/daughter in agreement with stated plan.       Further, pt seen  for dysphagia follow up.  Pt seen with trials of thin liquid and regular solids. No s/sx of aspiration noted across trials. Pt requiring fading assistance for holding up and brining regular solid to mouth. Slow rate and small sip maintained t/o trials. Recommend continuation of current diet recommendations with aspiration precautions (slow rate, small sips, elevated HOB, alert for PO intake). No further dysphagia intervention indicated at this time.     SLP Assessment  SLP Recommendation/Assessment: Patient needs continued Speech Lanaguage Pathology Services SLP Visit Diagnosis: Aphasia (R47.01)    Recommendations for follow up therapy are one component of a multi-disciplinary discharge planning process, led by the attending physician.  Recommendations may be updated based on patient status, additional functional criteria and insurance authorization.    Follow Up Recommendations  Follow physician's recommendations for discharge plan and follow up therapies    Assistance Recommended at Discharge  Frequent or constant Supervision/Assistance (for language based tasks)  Functional Status Assessment Patient has had a recent decline in their functional status and demonstrates the ability to make significant improvements in function in a reasonable and predictable amount of time.  Frequency and Duration min 1 x/week  2 weeks      SLP Evaluation Cognition  Overall Cognitive Status: Impaired/Different from baseline Arousal/Alertness: Awake/alert Orientation Level: Oriented to person (orientation questions limited secondary to language demand) Attention: Sustained Sustained Attention: Appears intact Memory:  (limited assessment secondary to language demand) Awareness:  (daughter reports emerging awareness and frustration with speech) Problem Solving:  (limited assessment secondary to language demand) Executive Function:  (limited assessment secondary to language demand) Behaviors:  Perseveration Safety/Judgment:  (limited assessment secondary to language demand)       Comprehension  Auditory Comprehension Overall Auditory Comprehension: Impaired Yes/No Questions: Impaired Basic Biographical Questions: 26-50% accurate Basic Immediate Environment Questions: 25-49% accurate Commands: Impaired One Step Basic Commands: 25-49% accurate Conversation: Simple Other Conversation Comments: conversation impacted by perseveration. perseverating on name and "yes" EffectiveTechniques: Extra processing time;Pausing;Repetition;Visual/Gestural cues Visual Recognition/Discrimination Discrimination: Within Function Limits (for reading words on white board) Reading Comprehension Reading Status:  (need further assessment- reading 2/3 targets from board)    Expression Expression Primary Mode of Expression: Verbal Verbal Expression Overall Verbal Expression: Impaired Initiation: No impairment Automatic Speech: Name Level of Generative/Spontaneous Verbalization: Phrase;Sentence Repetition: Impaired Level of Impairment: Word level Naming: Impairment Responsive: 0-25% accurate Confrontation: Impaired Convergent: 0-24% accurate Verbal Errors: Perseveration;Echolalia;Jargon;Other (comment) (emerging awareness/frustration) Effective Techniques: Semantic cues;Open ended questions Written Expression Dominant Hand: Right Written Expression: Unable to assess (comment) (pt held pen without attempt to write)   Oral / Motor  Oral Motor/Sensory Function Overall Oral Motor/Sensory Function: Within functional limits Motor Speech Overall Motor Speech: Appears within functional limits for tasks assessed (no overt dysarthria or groping oral patterns to suggest apraxia)           [place] Destine Zirkle Clapp, MS, CCC-SLP Speech Language Pathologist Rehab Services; [facility] [phone] (ascom)   [place] J Clapp 05/10/2023, 12:25 PM

## 2023-05-10 NOTE — Progress Notes (Signed)
*  PRELIMINARY RESULTS* Echocardiogram 2D Echocardiogram has been performed.  Silvana Drones 05/10/2023, 2:14 PM

## 2023-05-10 NOTE — Evaluation (Signed)
 Occupational Therapy Evaluation Patient Details Name: Amber Padilla MRN: [id number] DOB: [birth year] Today's Date: 05/10/2023   History of Present Illness Pt is a 88 y.o. female presenting to [facility] ED with nonsensical speech and L-sided HA. MRI showed no acute CVA, later pt had increased expressive aphasia and coordination difficulties. Admitted with possible TIA, hypertensive urgency and dyslipidemia. PMH significant for HTN, HLD, & DM.   Clinical Impression   Pt is pleasant, answers "yes" or "it's good" to most questions. Daughter Adriana Hopping in room provides home setup and PLOF.  PTA, pt lives alone and is independent including med + financial mgmt, no falls hx. Pt able to follow directional cuing for UE assessment with increased time (e.g. gives therapist high-five and reaches to touch head), but noted impairments in familiar functional task performance. Pt requires assist to self-feed, has a hard time locating items on tray in front of her and cannot identify proper utensils for type of food. When handed fork, pt able to coordinate putting eggs on utensil, but increased difficulties with manipulation of utensil and impaired grasp. R-sided inattention present with potential visual perceptual impairments along with expressive language difficulties. Pt appears to have basic receptive language with current ability to follow commands. Discussed with daughter need for 24/7 assist at discharge, options for STR, [facility] OT and outpatient OT. Anticipate pt requiring multimodal cues for environmental and safety awareness, and likely CGA-minA for ADLs/mobility.   Pt would benefit from skilled OT services to address noted impairments and functional limitations (see below for any additional details) in order to maximize safety and independence while minimizing falls risk and caregiver burden. Patient will benefit from continued inpatient follow up therapy, <3 hours/day.       If plan is discharge home, recommend the  following: A little help with walking and/or transfers;A little help with bathing/dressing/bathroom;Assistance with cooking/housework;Assistance with feeding;Direct supervision/assist for medications management;Direct supervision/assist for financial management;Assist for transportation;Help with stairs or ramp for entrance;Supervision due to cognitive status    Functional Status Assessment  Patient has had a recent decline in their functional status and demonstrates the ability to make significant improvements in function in a reasonable and predictable amount of time.  Equipment Recommendations  None recommended by OT    Recommendations for Other Services       Precautions / Restrictions Precautions Precautions: Fall Restrictions Weight Bearing Restrictions Per Provider Order: No      Mobility Bed Mobility Overal bed mobility: Needs Assistance             General bed mobility comments: Pt able to scoot self up in bed with use of bed rails    Transfers Overall transfer level: Needs assistance                 General transfer comment: NT this session      Balance Overall balance assessment: Needs assistance                             High Level Balance Comments: NT           ADL either performed or assessed with clinical judgement   ADL Overall ADL's : Needs assistance/impaired Eating/Feeding: Minimal assistance;Sitting;Bed level Eating/Feeding Details (indicate cue type and reason): pt requires minA for manipulation of utensils and cues to grasp cup. Daughter cuts up sausage, pt able to use fork with slow motor planning. Pt then tapping fork on grits, unaware that she needed to use  spoon. Could not locate spoon on tray, or problem-solve why fork was not working. When provied knife and spoon,  pt unable to choose proper item. Hand over hand provied by daughter to coordinate spoon Grooming: Cueing for sequencing;Sitting Grooming Details (indicate  cue type and reason): will require cues for task segmentation                 Toilet Transfer: Cueing for sequencing;Ambulation;Contact guard assist           Functional mobility during ADLs: Cueing for sequencing;Cueing for safety;Contact guard assist General ADL Comments: OOB mobility deferred this date - BP noted to be 173/63 (96), with food tray arriving. Per PT note, pt required supervision - HHA for mobility. Anticipate cues needed for task segmentation, and light minA-CGA for ADLs. Will need environmental cues for safety awarness due to vision deficits.     Vision Baseline Vision/History: 1 Wears glasses Vision Assessment?: Vision impaired- to be further tested in functional context Additional Comments: Difficult to determine true extent of visual deficits - unsure if R inattention, R visual field cut or depth perception. Pt could not put on eyeglasses without assist, attempts to place them on L side of face. Noted to have a harder time locating items on R side of food tray     Perception Perception: Impaired Preception Impairment Details: Inattention/Neglect Perception-Other Comments: R inattention       Pertinent Vitals/Pain Pain Assessment Pain Assessment: PAINAD Breathing: normal Negative Vocalization: none Facial Expression: smiling or inexpressive Body Language: relaxed Consolability: no need to console PAINAD Score: 0     Extremity/Trunk Assessment Upper Extremity Assessment Upper Extremity Assessment: Right hand dominant;Difficult to assess due to impaired cognition;RUE deficits/detail (Pt with poor coordination and functional task use for UB ADLs. Difficulties manipulating utensils and R inattention present (difficult to determine if visual perceptual vs visual field cut).) RUE Deficits / Details: Pt able to raise arms up above head, touch head and raise cup to mouth. RUE Coordination: decreased fine motor;decreased gross motor           Communication  Communication Communication: Difficulty communicating thoughts/reduced clarity of speech;Difficulty following commands/understanding Following commands: Follows one step commands with increased time;Follows one step commands inconsistently Cueing Techniques: Visual cues;Tactile cues;Verbal cues   Cognition Arousal: Alert Behavior During Therapy: WFL for tasks assessed/performed Overall Cognitive Status: Impaired/Different from baseline Area of Impairment: Orientation, Attention, Memory, Following commands, Safety/judgement, Awareness, Problem solving                 Orientation Level: Disoriented to, Place, Time, Situation Current Attention Level: Focused   Following Commands: Follows one step commands inconsistently, Follows one step commands consistently (follows commands with increased time, functional task performance impaired (pt requires cues to self-feed, but was able to follow directions to touch head, give high five, etc).)   Awareness: Intellectual Problem Solving: Slow processing, Decreased initiation, Requires tactile cues, Requires verbal cues, Difficulty sequencing General Comments: Per daughter, pt tends to answer "yes" to everything. When asked to read the clock, pt states "12" then says "12" in response to any questions for the remainder of the session. Daughter states this has been consistent during hosptial stay. At baseline, pt does not have cognition or language deficits     General Comments  notified RN of BP 173/63 (96).            Home Living Family/patient expects to be discharged to:: Private residence Living Arrangements: Alone Available Help at Discharge: Family Type  of Home: House Home Access: Level entry     Home Layout: One level     Bathroom Shower/Tub: Producer, television/film/video: Standard     Home Equipment: Grab bars - tub/shower;Shower seat          Prior Functioning/Environment Prior Level of Function :  Independent/Modified Independent             Mobility Comments: no falls hx ADLs Comments: independent        OT Problem List: Impaired UE functional use;Decreased cognition;Decreased coordination;Impaired vision/perception;Decreased knowledge of use of DME or AE;Impaired balance (sitting and/or standing)      OT Treatment/Interventions: Self-care/ADL training;Therapeutic exercise;Neuromuscular education;DME and/or AE instruction;Therapeutic activities;Visual/perceptual remediation/compensation;Cognitive remediation/compensation;Patient/family education;Balance training    OT Goals(Current goals can be found in the care plan section) Acute Rehab OT Goals OT Goal Formulation: With family Time For Goal Achievement: 05/24/23 Potential to Achieve Goals: Good  OT Frequency: Min 1X/week       AM-PAC OT "6 Clicks" Daily Activity     Outcome Measure Help from another person eating meals?: A Little Help from another person taking care of personal grooming?: A Little Help from another person toileting, which includes using toliet, bedpan, or urinal?: A Little Help from another person bathing (including washing, rinsing, drying)?: A Little Help from another person to put on and taking off regular upper body clothing?: A Little Help from another person to put on and taking off regular lower body clothing?: A Little 6 Click Score: 18   End of Session Nurse Communication: Mobility status;Other (comment) (high BP)  Activity Tolerance: Patient tolerated treatment well Patient left: in bed;with call bell/phone within reach;with bed alarm set;with family/visitor present  OT Visit Diagnosis: Cognitive communication deficit (R41.841);Other symptoms and signs involving cognitive function;Feeding difficulties (R63.3);Other symptoms and signs involving the nervous system (R29.898) Symptoms and signs involving cognitive functions:  (unsure of etiology)                Time: 1324-4010 OT Time  Calculation (min): 34 min Charges:  OT General Charges $OT Visit: 1 Visit OT Evaluation $OT Eval Moderate Complexity: 1 Mod Nels Munn L. Leodan Bolyard, OTR/L  05/10/23, 11:14 AM

## 2023-05-10 NOTE — Progress Notes (Signed)
NEUROLOGY CONSULT FOLLOW UP NOTE   Date of service: May 10, 2023 Patient Name: Amber Padilla MRN:  [id number] DOB:  [birth year]  Interval Hx/subjective   Patient's aphasia resolved yesterday around the time that she went for MRI brain, which did not show acute infarct. A few hrs later her aphasia resolved and has been persistent since that time. EEG performed today was normal.  Vitals   Vitals:   05/10/23 0403 05/10/23 0759 05/10/23 1157 05/10/23 1944  BP: (!) 178/67 (!) 167/61 (!) 141/62 (!) 168/65  Pulse: 67 63 61 (!) 58  Resp: 16 18 16    Temp: 98.4 F (36.9 C) 98.6 F (37 C) (!) 97.5 F (36.4 C) 97.8 F (36.6 C)  TempSrc:    Oral  SpO2: 93% 94% 97% 95%  Weight:      Height:         Body mass index is 23.88 kg/m.  Physical Exam   Gen: patient lying in bed, NAD CV: extremities appear well-perfused Resp: normal WOB  Neurologic Examination   MS: alert, does not answer orientation questions but perseverates on the word "yes," follows some but not all simple commands Speech: mild dysarthria, severe aphasia, unable to name and repeat CN: PERRL, blinks to threat bilaterqally, tracks examiner with apparently full EOM, face symmetric at rest, hearing intact to voice Motor: unable to participate in formal strength exam 2/2 difficulty following commands but is at least 4-/5 strength throughout Sensory: SILT  Cerebellar: no clear ataxia  NIHSS components Score: Comment  1a Level of Conscious 0[x]  1[]  2[]  3[]      1b LOC Questions 0[]  1[]  2[x]       1c LOC Commands 0[]  1[x]  2[]       2 Best Gaze 0[x]  1[]  2[]       3 Visual 0[x]  1[]  2[]  3[]      4 Facial Palsy 0[x]  1[]  2[]  3[]      5a Motor Arm - left 0[]  1[x]  2[]  3[]  4[]  UN[]    5b Motor Arm - Right 0[]  1[x]  2[]  3[]  4[]  UN[]    6a Motor Leg - Left 0[]  1[x]  2[]  3[]  4[]  UN[]    6b Motor Leg - Right 0[]  1[x]  2[]  3[]  4[]  UN[]    7 Limb Ataxia 0[x]  1[]  2[]  3[]  UN[]     8 Sensory 0[x]  1[]  2[]  UN[]      9 Best Language 0[]  1[]  2[x]   3[]      10 Dysarthria 0[]  1[x]  2[]  UN[]      11 Extinct. and Inattention 0[x]  1[]  2[]       TOTAL:  9     Medications  Current Facility-Administered Medications:    acetaminophen (TYLENOL) tablet 650 mg, 650 mg, Oral, Q6H PRN **OR** acetaminophen (TYLENOL) suppository 650 mg, 650 mg, Rectal, Q6H PRN, Mansy, Jan A, MD   acetaminophen (TYLENOL) tablet 1,000 mg, 1,000 mg, Oral, Once, Pilar Jarvis, MD   aspirin EC tablet 81 mg, 81 mg, Oral, Daily, Mansy, Jan A, MD, 81 mg at 05/10/23 1025   atorvastatin (LIPITOR) tablet 10 mg, 10 mg, Oral, Daily, Mansy, Jan A, MD, 10 mg at 05/10/23 1025   calcium-vitamin D (OSCAL WITH D) 500-5 MG-MCG per tablet 1 tablet, 1 tablet, Oral, Q breakfast, Mansy, Jan A, MD, 1 tablet at 05/10/23 1025   cholecalciferol (VITAMIN D3) 25 MCG (1000 UNIT) tablet 1,000 Units, 1,000 Units, Oral, Daily, Mansy, Jan A, MD, 1,000 Units at 05/10/23 1025   cloNIDine (CATAPRES) tablet 0.2 mg, 0.2 mg, Oral, Once, Pilar Jarvis, MD   cloNIDine (CATAPRES) tablet  0.2 mg, 0.2 mg, Oral, TID, Mansy, Jan A, MD, 0.2 mg at 05/10/23 1743   clopidogrel (PLAVIX) tablet 75 mg, 75 mg, Oral, Daily, Mansy, Jan A, MD, 75 mg at 05/10/23 1026   cyanocobalamin (VITAMIN B12) tablet 500 mcg, 500 mcg, Oral, Daily, Mansy, Jan A, MD, 500 mcg at 05/10/23 1025   doxazosin (CARDURA) tablet 4 mg, 4 mg, Oral, Daily, Mansy, Jan A, MD, 4 mg at 05/10/23 1024   enalaprilat (VASOTEC) injection 0.625 mg, 0.625 mg, Intravenous, Q6H PRN, Mansy, Jan A, MD, 0.625 mg at 05/09/23 0513   enoxaparin (LOVENOX) injection 30 mg, 30 mg, Subcutaneous, Q24H, Mansy, Jan A, MD, 30 mg at 05/10/23 1026   feeding supplement (ENSURE ENLIVE / ENSURE PLUS) liquid 237 mL, 237 mL, Oral, BID BM, Sunnie Nielsen, DO, 237 mL at 05/10/23 1355   ferrous sulfate tablet 325 mg, 325 mg, Oral, Daily, Mansy, Jan A, MD, 325 mg at 05/10/23 1025   hydrALAZINE (APRESOLINE) injection 10 mg, 10 mg, Intravenous, Q6H PRN, Manuela Schwartz, NP, 10 mg at 05/09/23  0327   irbesartan (AVAPRO) tablet 75 mg, 75 mg, Oral, Daily, Mansy, Jan A, MD, 75 mg at 05/10/23 1025   magnesium hydroxide (MILK OF MAGNESIA) suspension 30 mL, 30 mL, Oral, Daily PRN, Mansy, Jan A, MD   metoprolol succinate (TOPROL-XL) 24 hr tablet 100 mg, 100 mg, Oral, Daily, Manuela Schwartz, NP, 100 mg at 05/10/23 1025   nitroGLYCERIN (NITROGLYN) 2 % ointment 1 inch, 1 inch, Topical, Q6H PRN, Mansy, Jan A, MD   ondansetron (ZOFRAN) tablet 4 mg, 4 mg, Oral, Q6H PRN **OR** ondansetron (ZOFRAN) injection 4 mg, 4 mg, Intravenous, Q6H PRN, Mansy, Jan A, MD, 4 mg at 05/09/23 0981   traZODone (DESYREL) tablet 25 mg, 25 mg, Oral, QHS PRN, Mansy, Jan A, MD  Labs and Diagnostic Imaging   CBC:  Recent Labs  Lab 05/09/23 0043 05/09/23 0443  WBC 5.9 6.6  NEUTROABS 3.0  --   HGB 10.5* 11.4*  HCT 32.3* 35.7*  MCV 95.3 94.2  PLT 145* 143*    Basic Metabolic Panel:  Lab Results  Component Value Date   NA 138 05/09/2023   K 3.6 05/09/2023   CO2 21 (L) 05/09/2023   GLUCOSE 96 05/09/2023   BUN 19 05/09/2023   CREATININE 1.18 (H) 05/09/2023   CALCIUM 9.2 05/09/2023   GFRNONAA 45 (L) 05/09/2023   GFRAA 62 12/27/2019   Lipid Panel:  Lab Results  Component Value Date   LDLCALC 167 (H) 05/09/2023   HgbA1c:  Lab Results  Component Value Date   HGBA1C 6.3 (H) 01/20/2023   Urine Drug Screen: No results found for: "LABOPIA", "COCAINSCRNUR", "LABBENZ", "AMPHETMU", "THCU", "LABBARB"  Alcohol Level     Component Value Date/Time   ETH <10 05/09/2023 0043   INR  Lab Results  Component Value Date   INR 1.1 05/09/2023   APTT  Lab Results  Component Value Date   APTT 31 05/09/2023   AED levels: No results found for: "PHENYTOIN", "ZONISAMIDE", "LAMOTRIGINE", "LEVETIRACETA"  CT Head without contrast(Personally reviewed): Negative   CT angio Head and Neck with contrast(Personally reviewed): No LVO   MRI Brain(Personally reviewed): Negative  rEEG:  Normal  Assessment   Amber Padilla is a 88 y.o. female with a history of multiple stroke risk factors as well as unilateral headaches who presents with difficulty speaking in the setting of a unilateral left-sided headache.  Though her MRI was negative, her symptoms had improved around the time  of her MRI, and it is possible that a stuttering thalamic lacunar can give aphasia in the setting.  Complicated migraine given her history of headaches as well as her left-sided unilateral headache would be another consideration.  Seizure can be another cause of focal neurological dysfunction, but without any discrete episodes, loss of consciousness, motor involvement, or other activity to suggest seizure I think this is less likely.  EEG performed while patient was aphasic today was normal. She is afebrile and does not have any meningismus making infection less likely.   Recommendations   - Repeat brain MRI without contrast - Neurology will continue to follow ______________________________________________________________________   Signed, Jefferson Fuel, MD [facility]

## 2023-05-11 DIAGNOSIS — G459 Transient cerebral ischemic attack, unspecified: Secondary | ICD-10-CM | POA: Diagnosis not present

## 2023-05-11 LAB — GLUCOSE, CAPILLARY: Glucose-Capillary: 123 mg/dL — ABNORMAL HIGH (ref 70–99)

## 2023-05-11 MED ORDER — ATORVASTATIN CALCIUM 20 MG PO TABS: 80.0000 mg | ORAL_TABLET | Freq: Every day | ORAL | Status: DC

## 2023-05-11 MED ORDER — ATORVASTATIN CALCIUM 80 MG PO TABS: 80.0000 mg | ORAL_TABLET | Freq: Every day | ORAL | 0 refills | Status: AC

## 2023-05-11 MED ORDER — CLONIDINE HCL 0.2 MG PO TABS: 0.2000 mg | ORAL_TABLET | Freq: Three times a day (TID) | ORAL | 0 refills | Status: DC

## 2023-05-11 MED ORDER — ASPIRIN EC 81 MG PO TBEC: 81.0000 mg | DELAYED_RELEASE_TABLET | Freq: Every day | ORAL | Status: AC

## 2023-05-11 MED ORDER — CLOPIDOGREL BISULFATE 75 MG PO TABS
75.0000 mg | ORAL_TABLET | Freq: Every day | ORAL | 0 refills | Status: DC
Start: 2023-05-12 — End: 2024-02-07

## 2023-05-11 NOTE — Discharge Summary (Signed)
Physician Discharge Summary   Patient: Amber Padilla MRN: [id number]  DOB: 1935-05-20   Admit:     Date of Admission: 05/09/2023 Admitted from: home   Discharge: Date of discharge: 05/11/23 Disposition: Home Condition at discharge: good  CODE STATUS: FULL CODE     Discharge Physician: Sunnie Nielsen, DO [facility]     PCP: Reubin Milan, MD  Recommendations for Outpatient Follow-up:  Follow up with PCP Reubin Milan, MD in 1 week / ASAP for hospital follow up and BP check  Referred to neurology     Discharge Instructions     Ambulatory referral to Neurology   Complete by: As directed    Diet - low sodium heart healthy   Complete by: As directed    Increase activity slowly   Complete by: As directed          Discharge Diagnoses: Principal Problem:   TIA (transient ischemic attack) Active Problems:   Hypertensive urgency   Dyslipidemia   Type 2 diabetes mellitus without complications (HCC)   CVA (cerebral vascular accident) [facility])        Hospital course / significant events:   HPI: Amber Padilla is a 88 y.o. female with medical history significant for type 2 diabetes mellitus, hypertension, hypothyroidism and dyslipidemia, who presented to the emergency room with acute onset of expressive dysphasia, she was in her normal state of health around 5 PM with her daughter watching TV and talking to her as her normal.  Her daughter left and around 21 PM and the patient texted her incoherently, first with jumbled letters and then with random words that did not piece together to make any sense.  The daughter went to check on her mother around 39 PM and she did not answer the door.  She appeared confused and was having word salad as described per her daughter who is an Charity fundraiser.  She elaborated that she was training words together that were completely nonsensical however she appeared unfazed by her word choice.  This resolved by the time she came to the  ER and her only complaint was left-sided headache   02/09: to ED. Symptoms resolved at time of admission and MRI no acute CVA however alter in the morning expressive aphasia recurred, as well as coordination difficulty w/ upper extremities, describing L frontal/sinus headache (note no sinus infection on imaging but she does have cyst in maxillary sinus on L). Neurology recs - hold repeat MRI but would consider tomorrow, will get EEG, given headache will rx migraine cocktail w/ reglan + depakote in case seizure. Continue DAPT, statin  02/10: Echo done, pending read. EEG not done today. Speech still slurred - will repeat MRI brain   02/11: MRI (+)Small acute left posterior temporal lobe infarct. Echo EF 60-65, MOd high Pulm A sytolic pressure, G2DD, LA and RA dilation, mild/mod MR but no other significant valvular dz. Cardiology to mail Zio      Consultants:  Neurology   Procedures/Surgeries: none      ASSESSMENT & PLAN:   Small acute left posterior temporal lobe infarct confirmed MRI 02/10.   Potentially complicated by migraine, HTN encephalopathy.  Less likely for seizure  Appreciate neurology recs  DAPT: ASA + plavix x 21d then plavix alone  increased statin Neuro follow up    Hypertensive urgency - may explain headache also  Record review - BP typically is well controlled, family does not think pt has missed her usual meds Meds as  below  Close PCP follow up    Dyslipidemia Statin increased will need recheck this and CMP outpatient    Type 2 diabetes mellitus without complications (HCC) Resume home meds Outpatient f/u         Discharge Instructions  Allergies as of 05/11/2023       Reactions   Amlodipine Swelling   Other Reaction(s): Edema   Propoxyphene Rash        Medication List     TAKE these medications    acetaminophen 500 MG tablet Commonly known as: TYLENOL Take 1,000 mg by mouth every 6 (six) hours as needed.   aspirin EC 81 MG tablet Take  1 tablet (81 mg total) by mouth daily for 21 days.   atorvastatin 80 MG tablet Commonly known as: LIPITOR Take 1 tablet (80 mg total) by mouth daily. What changed:  medication strength how much to take   blood glucose meter kit and supplies Dispense based on patient and insurance preference. Use up to four times daily as directed. (FOR ICD-10 E10.9, E11.9).   Calcium Carb-Cholecalciferol 500-3.125 MG-MCG Tabs Take 1 tablet by mouth daily.   CALCIUM PO Take 600 mg by mouth.   calcium-vitamin D 500-200 MG-UNIT tablet Commonly known as: OSCAL WITH D Take 1 tablet by mouth.   cloNIDine 0.2 MG tablet Commonly known as: CATAPRES Take 1 tablet (0.2 mg total) by mouth 3 (three) times daily. What changed: when to take this   clopidogrel 75 MG tablet Commonly known as: PLAVIX Take 1 tablet (75 mg total) by mouth daily. Start taking on: May 12, 2023   doxazosin 4 MG tablet Commonly known as: CARDURA TAKE 1 TABLET DAILY   ferrous sulfate 324 MG Tbec Take 324 mg by mouth daily.   Lancets Misc 1 each by Does not apply route daily at 6 (six) AM.   Magnesium 500 MG Caps Take by mouth.   metFORMIN 500 MG tablet Commonly known as: GLUCOPHAGE TAKE 1 TABLET TWICE DAILY  WITH MEALS **ZYDUS MFR**   metoprolol succinate 100 MG 24 hr tablet Commonly known as: TOPROL-XL TAKE 1 TABLET DAILY WITH ORIMMEDIATELY FOLLOWING A    MEAL   OneTouch Verio test strip Generic drug: glucose blood Use up to 4 times daily as directed E10.9, E11.9   telmisartan 80 MG tablet Commonly known as: MICARDIS TAKE 1 TABLET DAILY   vitamin B-12 500 MCG tablet Commonly known as: CYANOCOBALAMIN Take 500 mcg by mouth daily.   VITAMIN D3 PO Take 1,000 Units by mouth.         Follow-up Information     Reubin Milan, MD. Schedule an appointment as soon as possible for a visit.   Specialty: Internal Medicine Why: hosptial follow up and BP check ASAP Contact information: [address] [place] [address] [phone]                 Allergies  Allergen Reactions   Amlodipine Swelling    Other Reaction(s): Edema   Propoxyphene Rash     Subjective: Pt reports feeling good today. Family states speech is better but not quite back to normal. EPisode overnight of lightheaded and low BP following getting up from the toilet, resolved. NO further issues after that.    Discharge Exam: BP (!) 163/76 (BP Location: Left Arm)   Pulse (!) 51   Temp (!) 97.4 F (36.3 C) (Oral)   Resp 18   Ht 5\' 4"  (1.626 m)   Wt  63.1 kg   SpO2 98%   BMI 23.88 kg/m  General: Pt is alert, awake, not in acute distress Cardiovascular: RRR, S1/S2 +, no rubs, no gallops Respiratory: CTA bilaterally, no wheezing, no rhonchi Abdominal: Soft, NT, ND, bowel sounds + Extremities: no edema, no cyanosis Neuro: normal coordination and strength, speech more coherent, still some slurring     The results of significant diagnostics from this hospitalization (including imaging, microbiology, ancillary and laboratory) are listed below for reference.     Microbiology: No results found for this or any previous visit (from the past 240 hours).   Labs: BNP (last 3 results) No results for input(s): "BNP" in the last 8760 hours. Basic Metabolic Panel: Recent Labs  Lab 05/09/23 0043 05/09/23 0443 05/10/23 0529  NA 138 138  --   K 3.6 3.6  --   CL 99 102  --   CO2 26 21*  --   GLUCOSE 102* 96  --   BUN 22 19  --   CREATININE 1.19* 1.18*  --   CALCIUM 9.2 9.2  --   MG  --   --  1.7   Liver Function Tests: Recent Labs  Lab 05/09/23 0043  AST 18  ALT 8  ALKPHOS 47  BILITOT 1.0  PROT 7.9  ALBUMIN 3.9   No results for input(s): "LIPASE", "AMYLASE" in the last 168 hours. Recent Labs  Lab 05/09/23 1605  AMMONIA 36*   CBC: Recent Labs  Lab 05/09/23 0043 05/09/23 0443  WBC 5.9 6.6  NEUTROABS 3.0  --   HGB 10.5* 11.4*  HCT 32.3* 35.7*  MCV 95.3 94.2  PLT 145*  143*   Cardiac Enzymes: No results for input(s): "CKTOTAL", "CKMB", "CKMBINDEX", "TROPONINI" in the last 168 hours. BNP: Invalid input(s): "POCBNP" CBG: Recent Labs  Lab 05/09/23 0030 05/09/23 0435 05/10/23 0124 05/11/23 0453  GLUCAP 99 96 91 123*   D-Dimer No results for input(s): "DDIMER" in the last 72 hours. Hgb A1c No results for input(s): "HGBA1C" in the last 72 hours. Lipid Profile Recent Labs    05/09/23 0443  CHOL 238*  HDL 59  LDLCALC 167*  TRIG 61  CHOLHDL 4.0   Thyroid function studies Recent Labs    05/09/23 1605  TSH 1.316   Anemia work up Recent Labs    05/09/23 1605  VITAMINB12 3,369*   Urinalysis    Component Value Date/Time   COLORURINE YELLOW (A) 05/09/2023 0934   APPEARANCEUR CLEAR (A) 05/09/2023 0934   LABSPEC 1.041 (H) 05/09/2023 0934   PHURINE 5.0 05/09/2023 0934   GLUCOSEU 50 (A) 05/09/2023 0934   HGBUR NEGATIVE 05/09/2023 0934   BILIRUBINUR NEGATIVE 05/09/2023 0934   BILIRUBINUR neg 07/11/2021 1017   KETONESUR 5 (A) 05/09/2023 0934   PROTEINUR NEGATIVE 05/09/2023 0934   UROBILINOGEN 0.2 07/11/2021 1017   NITRITE NEGATIVE 05/09/2023 0934   LEUKOCYTESUR NEGATIVE 05/09/2023 0934   Sepsis Labs Recent Labs  Lab 05/09/23 0043 05/09/23 0443  WBC 5.9 6.6   Microbiology No results found for this or any previous visit (from the past 240 hours). Imaging MR BRAIN WO CONTRAST Result Date: 05/11/2023 CLINICAL DATA:  Stroke, follow up.  New onset aphasia. EXAM: MRI HEAD WITHOUT CONTRAST TECHNIQUE: Multiplanar, multiecho pulse sequences of the brain and surrounding structures were obtained without intravenous contrast. COMPARISON:  May 09, 2023 MRI head. FINDINGS: Brain:. Small acute left posterior temporal lobe infarct. Mild edema. No significant mass effect. Additional moderate for age T2/FLAIR hyperintensities in the white matter  are nonspecific but compatible with chronic microvascular ischemic disease. No evidence of acute  hemorrhage, mass lesion, midline shift or hydrocephalus. Vascular: Major arterial flow voids are maintained at the skull base. Skull and upper cervical spine: Normal marrow signal. Sinuses/Orbits: Left maxillary sinus retention cysts. No acute orbital abnormality. Other: No mastoid effusions. IMPRESSION: Small acute left posterior temporal lobe infarct. These results will be called to the ordering clinician or representative by the Radiologist Assistant, and communication documented in the PACS or [employer]. Electronically Signed   By: Feliberto Harts M.D.   On: 05/11/2023 02:11   EEG adult Result Date: 05/10/2023 Jefferson Fuel, MD     05/10/2023  8:24 PM Routine EEG Report Keyoni Lapinski is a 88 y.o. female with a history of altered mental status who is undergoing an EEG to evaluate for seizures. Report: This EEG was acquired with electrodes placed according to the International 10-20 electrode system (including Fp1, Fp2, F3, F4, C3, C4, P3, P4, O1, O2, T3, T4, T5, T6, A1, A2, Fz, Cz, Pz). The following electrodes were missing or displaced: none. The occipital dominant rhythm was 8.5 Hz. This activity is reactive to stimulation. Drowsiness was manifested by background fragmentation; deeper stages of sleep were identified by K complexes and sleep spindles. There was no focal slowing. There were no interictal epileptiform discharges. There were no electrographic seizures identified. There was no abnormal response to photic stimulation or hyperventilation. Impression: This EEG was obtained while awake and asleep and is normal.   Clinical Correlation: Normal EEGs, however, do not rule out epilepsy. Bing Neighbors, MD [facility] [phone] If 7pm- 7am, please page neurology on call as listed in AMION.   ECHOCARDIOGRAM COMPLETE BUBBLE STUDY Result Date: 05/10/2023    ECHOCARDIOGRAM REPORT   Patient Name:   TAMALYN WADSWORTH Date of Exam: 05/10/2023 Medical Rec #:  [id number]      Height:        64.0 in Accession #:    [id number]     Weight:       139.1 lb Date of Birth:  Feb 03, 1936      BSA:          1.677 m Patient Age:    87 years       BP:           141/62 mmHg Patient Gender: F              HR:           54 bpm. Exam Location:  [facility] Procedure: 2D Echo, Cardiac Doppler, Color Doppler and Saline Contrast Bubble            Study Indications:     TIA  History:         Patient has prior history of Echocardiogram examinations, most                  recent 07/15/2020. TIA; Risk Factors:Hypertension, Diabetes and                  Dyslipidemia.  Sonographer:     Mikki Harbor Referring Phys:  [id number] JAN A MANSY Diagnosing Phys: Rozell Searing Custovic IMPRESSIONS  1. Left ventricular ejection fraction, by estimation, is 60 to 65%. The left ventricle has normal function. The left ventricle has no regional wall motion abnormalities. Left ventricular diastolic parameters are consistent with Grade II diastolic dysfunction (pseudonormalization).  2. Right ventricular systolic function is normal. The right ventricular size is normal. There is moderately elevated  pulmonary artery systolic pressure.  3. Left atrial size was severely dilated.  4. Right atrial size was mildly dilated.  5. The mitral valve is grossly normal. Mild to moderate mitral valve regurgitation. No evidence of mitral stenosis.  6. Tricuspid valve regurgitation is moderate.  7. The aortic valve is normal in structure. Aortic valve regurgitation is trivial. No aortic stenosis is present. FINDINGS  Left Ventricle: Left ventricular ejection fraction, by estimation, is 60 to 65%. The left ventricle has normal function. The left ventricle has no regional wall motion abnormalities. The left ventricular internal cavity size was normal in size. There is  no left ventricular hypertrophy. Left ventricular diastolic parameters are consistent with Grade II diastolic dysfunction (pseudonormalization). Right Ventricle: The right ventricular size is normal. No increase  in right ventricular wall thickness. Right ventricular systolic function is normal. There is moderately elevated pulmonary artery systolic pressure. The tricuspid regurgitant velocity is 3.09 m/s, and with an assumed right atrial pressure of 8 mmHg, the estimated right ventricular systolic pressure is 46.2 mmHg. Left Atrium: Left atrial size was severely dilated. Right Atrium: Right atrial size was mildly dilated. Pericardium: There is no evidence of pericardial effusion. Mitral Valve: The mitral valve is grossly normal. Mild to moderate mitral valve regurgitation. No evidence of mitral valve stenosis. MV peak gradient, 6.2 mmHg. The mean mitral valve gradient is 2.0 mmHg. Tricuspid Valve: The tricuspid valve is grossly normal. Tricuspid valve regurgitation is moderate. Aortic Valve: The aortic valve is normal in structure. Aortic valve regurgitation is trivial. No aortic stenosis is present. Aortic valve mean gradient measures 6.0 mmHg. Aortic valve peak gradient measures 12.1 mmHg. Aortic valve area, by VTI measures 2.65 cm. Pulmonic Valve: The pulmonic valve was grossly normal. Pulmonic valve regurgitation is not visualized. Aorta: The aortic root is normal in size and structure. IAS/Shunts: No atrial level shunt detected by color flow Doppler. Agitated saline contrast was given intravenously to evaluate for intracardiac shunting.  LEFT VENTRICLE PLAX 2D LVIDd:         5.00 cm   Diastology LVIDs:         2.30 cm   LV e' medial:    5.11 cm/s LV PW:         1.30 cm   LV E/e' medial:  16.5 LV IVS:        1.50 cm   LV e' lateral:   5.87 cm/s LVOT diam:     1.90 cm   LV E/e' lateral: 14.4 LV SV:         115 LV SV Index:   68 LVOT Area:     2.84 cm  RIGHT VENTRICLE RV Basal diam:  3.30 cm RV Mid diam:    3.30 cm RV S prime:     14.00 cm/s TAPSE (M-mode): 3.0 cm LEFT ATRIUM             Index        RIGHT ATRIUM           Index LA diam:        5.60 cm 3.34 cm/m   RA Area:     16.10 cm LA Vol (A2C):   92.9 ml 55.41  ml/m  RA Volume:   40.90 ml  24.39 ml/m LA Vol (A4C):   97.1 ml 57.91 ml/m LA Biplane Vol: 98.0 ml 58.45 ml/m  AORTIC VALVE  PULMONIC VALVE AV Area (Vmax):    2.51 cm      PV Vmax:       1.32 m/s AV Area (Vmean):   2.45 cm      PV Peak grad:  7.0 mmHg AV Area (VTI):     2.65 cm AV Vmax:           174.00 cm/s AV Vmean:          111.000 cm/s AV VTI:            0.433 m AV Peak Grad:      12.1 mmHg AV Mean Grad:      6.0 mmHg LVOT Vmax:         154.00 cm/s LVOT Vmean:        96.000 cm/s LVOT VTI:          0.405 m LVOT/AV VTI ratio: 0.94  AORTA Ao Root diam: 2.90 cm MITRAL VALVE                TRICUSPID VALVE MV Area (PHT): 2.13 cm     TR Peak grad:   38.2 mmHg MV Area VTI:   2.84 cm     TR Vmax:        309.00 cm/s MV Peak grad:  6.2 mmHg MV Mean grad:  2.0 mmHg     SHUNTS MV Vmax:       1.24 m/s     Systemic VTI:  0.40 m MV Vmean:      58.4 cm/s    Systemic Diam: 1.90 cm MV Decel Time: 356 msec MV E velocity: 84.40 cm/s MV A velocity: 113.00 cm/s MV E/A ratio:  0.75 Designer, multimedia signed by Clotilde Dieter Signature Date/Time: 05/10/2023/4:38:16 PM    Final       Time coordinating discharge: over 30 minutes  SIGNED:  Sunnie Nielsen DO [facility]

## 2023-05-11 NOTE — Plan of Care (Signed)
Daughter reported that pt had an assisted fall on her way back from the bathroom.  She explained that she was holding on to her mother and walking her back to the bed, when she felt her mom go limp and assisted her to the floor.  Both pt and daughter said that she didn't hit anything or hurt herself.  She had just had a BM and don't know if she had a vagal event.  Pt has been scored a Level 5 mobility by PT and their recommendation is for pt to have HHPT. VS and blood sugar were checked and everything was w/in normal parameters.  Will continue to monitor pt.

## 2023-05-11 NOTE — Progress Notes (Incomplete)
NEUROLOGY CONSULT FOLLOW UP NOTE   Date of service: May 11, 2023 Patient Name: Amber Padilla MRN:  [id number] DOB:  [birth year]  Interval Hx/subjective   Aphasia***  Vitals   Vitals:   05/11/23 0452 05/11/23 0652 05/11/23 0911 05/11/23 1218  BP: 121/83 139/83 (!) 177/68 (!) 163/76  Pulse: 98 (!) 108 94 (!) 51  Resp:   18 18  Temp: (!) 97.5 F (36.4 C) 98.4 F (36.9 C)  (!) 97.4 F (36.3 C)  TempSrc:    Oral  SpO2: 98% 98% 95% 98%  Weight:      Height:         Body mass index is 23.88 kg/m.  Physical Exam   Gen: patient lying in bed, NAD CV: extremities appear well-perfused Resp: normal WOB  Neurologic Examination   MS: alert, does not answer orientation questions but perseverates on the word "yes," follows some but not all simple commands Speech: mild dysarthria, severe aphasia, unable to name and repeat CN: PERRL, blinks to threat bilaterqally, tracks examiner with apparently full EOM, face symmetric at rest, hearing intact to voice Motor: unable to participate in formal strength exam 2/2 difficulty following commands but is at least 4-/5 strength throughout Sensory: SILT  Cerebellar: no clear ataxia  NIHSS components Score: Comment  1a Level of Conscious 0[x]  1[]  2[]  3[]      1b LOC Questions 0[]  1[]  2[x]       1c LOC Commands 0[]  1[x]  2[]       2 Best Gaze 0[x]  1[]  2[]       3 Visual 0[x]  1[]  2[]  3[]      4 Facial Palsy 0[x]  1[]  2[]  3[]      5a Motor Arm - left 0[]  1[x]  2[]  3[]  4[]  UN[]    5b Motor Arm - Right 0[]  1[x]  2[]  3[]  4[]  UN[]    6a Motor Leg - Left 0[]  1[x]  2[]  3[]  4[]  UN[]    6b Motor Leg - Right 0[]  1[x]  2[]  3[]  4[]  UN[]    7 Limb Ataxia 0[x]  1[]  2[]  3[]  UN[]     8 Sensory 0[x]  1[]  2[]  UN[]      9 Best Language 0[]  1[]  2[x]  3[]      10 Dysarthria 0[]  1[x]  2[]  UN[]      11 Extinct. and Inattention 0[x]  1[]  2[]       TOTAL:  9     Medications  Current Facility-Administered Medications:    acetaminophen (TYLENOL) tablet 650 mg, 650 mg, Oral, Q6H PRN  **OR** acetaminophen (TYLENOL) suppository 650 mg, 650 mg, Rectal, Q6H PRN, Mansy, Jan A, MD   acetaminophen (TYLENOL) tablet 1,000 mg, 1,000 mg, Oral, Once, Pilar Jarvis, MD   aspirin EC tablet 81 mg, 81 mg, Oral, Daily, Mansy, Jan A, MD, 81 mg at 05/11/23 0911   atorvastatin (LIPITOR) tablet 10 mg, 10 mg, Oral, Daily, Mansy, Jan A, MD, 10 mg at 05/11/23 0911   calcium-vitamin D (OSCAL WITH D) 500-5 MG-MCG per tablet 1 tablet, 1 tablet, Oral, Q breakfast, Mansy, Jan A, MD, 1 tablet at 05/11/23 0900   cholecalciferol (VITAMIN D3) 25 MCG (1000 UNIT) tablet 1,000 Units, 1,000 Units, Oral, Daily, Mansy, Jan A, MD, 1,000 Units at 05/11/23 0913   cloNIDine (CATAPRES) tablet 0.2 mg, 0.2 mg, Oral, TID, Mansy, Jan A, MD, 0.2 mg at 05/11/23 9147   clopidogrel (PLAVIX) tablet 75 mg, 75 mg, Oral, Daily, Mansy, Jan A, MD, 75 mg at 05/11/23 8295   cyanocobalamin (VITAMIN B12) tablet 500 mcg, 500 mcg, Oral, Daily, Mansy, Jan A, MD, 500 mcg at  05/11/23 0911   doxazosin (CARDURA) tablet 4 mg, 4 mg, Oral, Daily, Mansy, Jan A, MD, 4 mg at 05/11/23 0917   enalaprilat (VASOTEC) injection 0.625 mg, 0.625 mg, Intravenous, Q6H PRN, Mansy, Jan A, MD, 0.625 mg at 05/09/23 0513   enoxaparin (LOVENOX) injection 30 mg, 30 mg, Subcutaneous, Q24H, Mansy, Jan A, MD, 30 mg at 05/11/23 0913   feeding supplement (ENSURE ENLIVE / ENSURE PLUS) liquid 237 mL, 237 mL, Oral, BID BM, Sunnie Nielsen, DO, 237 mL at 05/11/23 1610   ferrous sulfate tablet 325 mg, 325 mg, Oral, Daily, Mansy, Jan A, MD, 325 mg at 05/11/23 0913   hydrALAZINE (APRESOLINE) injection 10 mg, 10 mg, Intravenous, Q6H PRN, Manuela Schwartz, NP, 10 mg at 05/11/23 0050   irbesartan (AVAPRO) tablet 75 mg, 75 mg, Oral, Daily, Mansy, Jan A, MD, 75 mg at 05/11/23 0913   magnesium hydroxide (MILK OF MAGNESIA) suspension 30 mL, 30 mL, Oral, Daily PRN, Mansy, Jan A, MD   metoprolol succinate (TOPROL-XL) 24 hr tablet 100 mg, 100 mg, Oral, Daily, Manuela Schwartz, NP, 100 mg  at 05/11/23 9604   nitroGLYCERIN (NITROGLYN) 2 % ointment 1 inch, 1 inch, Topical, Q6H PRN, Mansy, Jan A, MD   ondansetron (ZOFRAN) tablet 4 mg, 4 mg, Oral, Q6H PRN **OR** ondansetron (ZOFRAN) injection 4 mg, 4 mg, Intravenous, Q6H PRN, Mansy, Jan A, MD, 4 mg at 05/09/23 5409   traZODone (DESYREL) tablet 25 mg, 25 mg, Oral, QHS PRN, Mansy, Jan A, MD  Labs and Diagnostic Imaging   CBC:  Recent Labs  Lab 05/09/23 0043 05/09/23 0443  WBC 5.9 6.6  NEUTROABS 3.0  --   HGB 10.5* 11.4*  HCT 32.3* 35.7*  MCV 95.3 94.2  PLT 145* 143*    Basic Metabolic Panel:  Lab Results  Component Value Date   NA 138 05/09/2023   K 3.6 05/09/2023   CO2 21 (L) 05/09/2023   GLUCOSE 96 05/09/2023   BUN 19 05/09/2023   CREATININE 1.18 (H) 05/09/2023   CALCIUM 9.2 05/09/2023   GFRNONAA 45 (L) 05/09/2023   GFRAA 62 12/27/2019   Lipid Panel:  Lab Results  Component Value Date   LDLCALC 167 (H) 05/09/2023   HgbA1c:  Lab Results  Component Value Date   HGBA1C 6.3 (H) 01/20/2023   Urine Drug Screen: No results found for: "LABOPIA", "COCAINSCRNUR", "LABBENZ", "AMPHETMU", "THCU", "LABBARB"  Alcohol Level     Component Value Date/Time   ETH <10 05/09/2023 0043   INR  Lab Results  Component Value Date   INR 1.1 05/09/2023   APTT  Lab Results  Component Value Date   APTT 31 05/09/2023   AED levels: No results found for: "PHENYTOIN", "ZONISAMIDE", "LAMOTRIGINE", "LEVETIRACETA"  CT Head without contrast(Personally reviewed): Negative   CT angio Head and Neck with contrast(Personally reviewed): No LVO   MRI Brain(Personally reviewed): Negative  rEEG:  Normal  Assessment   Amber Padilla is a 88 y.o. female with a history of multiple stroke risk factors as well as unilateral headaches who presents with difficulty speaking in the setting of a unilateral left-sided headache.  Though her MRI was negative, her symptoms had improved around the time of her MRI, and it is possible that a  stuttering thalamic lacunar can give aphasia in the setting.  Complicated migraine given her history of headaches as well as her left-sided unilateral headache would be another consideration.  Seizure can be another cause of focal neurological dysfunction, but without any discrete episodes, loss of  consciousness, motor involvement, or other activity to suggest seizure I think this is less likely.  EEG performed while patient was aphasic today was normal. She is afebrile and does not have any meningismus making infection less likely.   Recommendations   - Repeat brain MRI without contrast - Neurology will continue to follow ______________________________________________________________________   Signed, Jefferson Fuel, MD [facility]

## 2023-05-11 NOTE — TOC Progression Note (Signed)
Transition of Care [facility]) - Progression Note    Patient Details  Name: Amber Padilla MRN: [id number] Date of Birth: 04-Jan-1936  Transition of Care [facility]) CM/SW Contact  Allena Katz, LCSW Phone Number: 05/11/2023, 10:21 AM  Clinical Narrative:   CSW spoke with patients daughter about [facility] services. She is agreeable and reports no preference. Message sent to [name] with [facility] to see if she can accept for PT/OT/RN.         Expected Discharge Plan and Services                                               Social Determinants of Health (SDOH) Interventions SDOH Screenings   Food Insecurity: No Food Insecurity (05/09/2023)  Housing: Low Risk  (05/09/2023)  Transportation Needs: No Transportation Needs (05/09/2023)  Utilities: Not At Risk (05/09/2023)  Alcohol Screen: Low Risk  (07/02/2022)  Depression (PHQ2-9): Low Risk  (01/20/2023)  Financial Resource Strain: Low Risk  (07/02/2022)  Physical Activity: Insufficiently Active (07/02/2022)  Social Connections: Moderately Isolated (05/09/2023)  Stress: No Stress Concern Present (07/02/2022)  Tobacco Use: Low Risk  (05/09/2023)    Readmission Risk Interventions     No data to display

## 2023-05-11 NOTE — Plan of Care (Signed)
  Problem: Education: Goal: Knowledge of disease or condition will improve Outcome: Progressing Goal: Knowledge of patient specific risk factors will improve (DELETE if not current risk factor) Outcome: Progressing   Problem: Ischemic Stroke/TIA Tissue Perfusion: Goal: Complications of ischemic stroke/TIA will be minimized Outcome: Progressing   Problem: Nutrition: Goal: Adequate nutrition will be maintained Outcome: Progressing   Problem: Coping: Goal: Level of anxiety will decrease Outcome: Progressing   Problem: Pain Managment: Goal: General experience of comfort will improve and/or be controlled Outcome: Progressing   Problem: Safety: Goal: Ability to remain free from injury will improve Outcome: Progressing   Problem: Skin Integrity: Goal: Risk for impaired skin integrity will decrease Outcome: Progressing

## 2023-05-11 NOTE — TOC Transition Note (Signed)
Transition of Care [facility]) - Discharge Note   Patient Details  Name: Amber Padilla MRN: [id number] Date of Birth: 08-27-1935  Transition of Care [facility]) CM/SW Contact:  Allena Katz, LCSW Phone Number: 05/11/2023, 2:58 PM   Clinical Narrative:   Pt discharging home with [facility]. [name] with [facility] notified.     Final next level of care: Home w Home Health Services Barriers to Discharge: Barriers Resolved   Patient Goals and CMS Choice Patient states their goals for this hospitalization and ongoing recovery are:: go home with [facility] CMS Medicare.gov Compare Post Acute Care list provided to:: Patient Choice offered to / list presented to : Patient      Discharge Placement                    Patient and family notified of of transfer: 05/11/23  Discharge Plan and Services Additional resources added to the After Visit Summary for                            [facility] Arranged: PT, OT, RN [facility] Agency: [facility] Date [facility] Agency Contacted: 05/11/23   Representative spoke with at [facility] Agency: [name]  Social Drivers of Health (SDOH) Interventions SDOH Screenings   Food Insecurity: No Food Insecurity (05/09/2023)  Housing: Low Risk  (05/09/2023)  Transportation Needs: No Transportation Needs (05/09/2023)  Utilities: Not At Risk (05/09/2023)  Alcohol Screen: Low Risk  (07/02/2022)  Depression (PHQ2-9): Low Risk  (01/20/2023)  Financial Resource Strain: Low Risk  (07/02/2022)  Physical Activity: Insufficiently Active (07/02/2022)  Social Connections: Moderately Isolated (05/09/2023)  Stress: No Stress Concern Present (07/02/2022)  Tobacco Use: Low Risk  (05/09/2023)     Readmission Risk Interventions     No data to display

## 2023-05-11 NOTE — Progress Notes (Signed)
Physical Therapy Treatment Patient Details Name: Amber Padilla MRN: [id number] DOB: 27-Jun-1935 Today's Date: 05/11/2023   History of Present Illness Pt is a 88 y.o. female presenting to [facility] ED with nonsensical speech and L-sided HA. MRI showed no acute CVA, later pt had increased expressive aphasia and coordination difficulties. Admitted with possible TIA, hypertensive urgency and dyslipidemia. PMH significant for HTN, HLD, & DM.    PT Comments  Fall noted this AM.  No c/o pain during session.  She is able to get in and out of bed with ease and no assist.  Initially quite resistant to using RW for support and balance.  She walks 61' with no AD or HHA with lateral sway.  Is given RW with significant improvement in gait and safety.  Education is provided and she is encouraged to use RW that she has at home at least until strength is improved.  Pt agrees to use RW upon discharge initially.  Discussed with daughter in room who agrees with need for RW.  Pt declined further interventions at this time but would benefit from continued therapy at home. Daughter encouraged to provide +1 assist for mobility upon discharge.   If plan is discharge home, recommend the following: A little help with walking and/or transfers;Assist for transportation;Assistance with cooking/housework;Help with stairs or ramp for entrance   Can travel by private vehicle        Equipment Recommendations  None recommended by PT    Recommendations for Other Services       Precautions / Restrictions Precautions Precautions: Fall Restrictions Weight Bearing Restrictions Per Provider Order: No     Mobility  Bed Mobility Overal bed mobility: Modified Independent               Patient Response: Cooperative  Transfers Overall transfer level: Modified independent Equipment used: None Transfers: Sit to/from Stand Sit to Stand: Supervision                Ambulation/Gait Ambulation/Gait assistance:  Supervision, Contact guard assist Gait Distance (Feet): 200 Feet Assistive device: Rolling walker (2 wheels), None Gait Pattern/deviations: Step-to pattern, Decreased step length - left, Decreased step length - right Gait velocity: decr     General Gait Details: initially no AD but some lateral sway.  agrees to RW with enocouragement with improved gait   Stairs             Wheelchair Mobility     Tilt Bed Tilt Bed Patient Response: Cooperative  Modified Rankin (Stroke Patients Only)       Balance Overall balance assessment: Needs assistance Sitting-balance support: No upper extremity supported, Feet supported Sitting balance-Leahy Scale: Good     Standing balance support: Bilateral upper extremity supported Standing balance-Leahy Scale: Fair                              Communication    Cognition Arousal: Alert Behavior During Therapy: WFL for tasks assessed/performed   PT - Cognitive impairments: No apparent impairments                           Following commands impaired: Only follows one step commands consistently    Cueing Cueing Techniques: Visual cues, Tactile cues, Verbal cues  Exercises      General Comments        Pertinent Vitals/Pain Pain Assessment Pain Assessment: No/denies pain Pain Intervention(s): Monitored during session  Home Living                          Prior Function            PT Goals (current goals can now be found in the care plan section) Progress towards PT goals: Progressing toward goals    Frequency    Min 1X/week      PT Plan      Co-evaluation              AM-PAC PT "6 Clicks" Mobility   Outcome Measure  Help needed turning from your back to your side while in a flat bed without using bedrails?: None Help needed moving from lying on your back to sitting on the side of a flat bed without using bedrails?: None Help needed moving to and from a bed to a chair  (including a wheelchair)?: None Help needed standing up from a chair using your arms (e.g., wheelchair or bedside chair)?: None Help needed to walk in hospital room?: A Little Help needed climbing 3-5 steps with a railing? : A Little 6 Click Score: 22    End of Session Equipment Utilized During Treatment: Gait belt Activity Tolerance: Patient tolerated treatment well;No increased pain Patient left: with call bell/phone within reach;with family/visitor present;in bed;with bed alarm set Nurse Communication: Mobility status PT Visit Diagnosis: Difficulty in walking, not elsewhere classified (R26.2);Unsteadiness on feet (R26.81);Other abnormalities of gait and mobility (R26.89);Muscle weakness (generalized) (M62.81);Other symptoms and signs involving the nervous system (R29.898)     Time: 5621-3086 PT Time Calculation (min) (ACUTE ONLY): 9 min  Charges:    $Gait Training: 8-22 mins PT General Charges $$ ACUTE PT VISIT: 1 Visit                   Danielle Dess, PTA 05/11/23, 9:37 AM

## 2023-05-12 ENCOUNTER — Telehealth: Payer: Self-pay

## 2023-05-12 NOTE — Transitions of Care (Post Inpatient/ED Visit) (Signed)
05/12/2023  Name: Amber Padilla MRN: [id number] DOB: 12-17-1935  Today's TOC FU Call Status: Today's TOC FU Call Status:: Successful TOC FU Call Completed Unsuccessful Call (1st Attempt) Date: 05/12/23 [facility] FU Call Complete Date: 05/12/23 Patient's Name and Date of Birth confirmed.  Transition Care Management Follow-up Telephone Call Date of Discharge: 05/11/23 Type of Discharge: Inpatient Admission Primary Inpatient Discharge Diagnosis:: TIA How have you been since you were released from the hospital?: Better Any questions or concerns?: No  Items Reviewed: Did you receive and understand the discharge instructions provided?: Yes Medications obtained,verified, and reconciled?: Yes (Medications Reviewed) Any new allergies since your discharge?: No Dietary orders reviewed?: Yes Do you have support at home?: Yes People in Home: child(ren), adult  Medications Reviewed Today: Medications Reviewed Today     Reviewed by Karena Addison, LPN (Licensed Practical Nurse) on 05/12/23 at 1029  Med List Status: <None>   Medication Order Taking? Sig Documenting Provider Last Dose Status Informant  acetaminophen (TYLENOL) 500 MG tablet [id number] No Take 1,000 mg by mouth every 6 (six) hours as needed. [provider] Taking Active Self, Child  aspirin EC 81 MG tablet [id number]  Take 1 tablet (81 mg total) by mouth daily for 21 days. Sunnie Nielsen, DO  Active   atorvastatin (LIPITOR) 80 MG tablet [id number]  Take 1 tablet (80 mg total) by mouth daily. Sunnie Nielsen, DO  Active   blood glucose meter kit and supplies [id number] No Dispense based on patient and insurance preference. Use up to four times daily as directed. (FOR ICD-10 E10.9, E11.9). Reubin Milan, MD Taking Active Child  Calcium Carb-Cholecalciferol 500-3.125 MG-MCG TABS [id number] No Take 1 tablet by mouth daily. [provider] 05/08/2023 Morning Active Child  CALCIUM PO [id number] No Take 600 mg by  mouth. [provider] 05/08/2023 Morning Active Child  calcium-vitamin D (OSCAL WITH D) 500-200 MG-UNIT tablet [id number] No Take 1 tablet by mouth. [provider] 05/08/2023 Morning Active Self, Child  Cholecalciferol (VITAMIN D3 PO) [id number] No Take 1,000 Units by mouth. [provider] 05/08/2023 Morning Active Child  cloNIDine (CATAPRES) 0.2 MG tablet [id number]  Take 1 tablet (0.2 mg total) by mouth 3 (three) times daily. Sunnie Nielsen, DO  Active   clopidogrel (PLAVIX) 75 MG tablet [id number]  Take 1 tablet (75 mg total) by mouth daily. Sunnie Nielsen, DO  Active   doxazosin (CARDURA) 4 MG tablet [id number] No TAKE 1 TABLET DAILY Reubin Milan, MD 05/08/2023 Morning Active Child  ferrous sulfate 324 MG TBEC [id number] No Take 324 mg by mouth daily. [provider] 05/08/2023 Morning Active Child  Lancets MISC [id number] No 1 each by Does not apply route daily at 6 (six) AM. Reubin Milan, MD Taking Active Child  Magnesium 500 MG CAPS [id number] No Take by mouth. [provider] 05/08/2023 Morning Active Self, Child  metFORMIN (GLUCOPHAGE) 500 MG tablet [id number] No TAKE 1 TABLET TWICE DAILY  WITH MEALS **ZYDUS MFR** Reubin Milan, MD 05/08/2023 Morning Active Child  metoprolol succinate (TOPROL-XL) 100 MG 24 hr tablet [id number] No TAKE 1 TABLET DAILY WITH ORIMMEDIATELY FOLLOWING A    MEAL Reubin Milan, MD 05/08/2023 Morning Active Child  ONETOUCH VERIO test strip [id number] No Use up to 4 times daily as directed E10.9, E11.9 Reubin Milan, MD Taking Active Child  telmisartan (MICARDIS) 80 MG tablet [id number] No TAKE 1 TABLET DAILY Reubin Milan, MD 05/08/2023 Morning Active Child  vitamin B-12 (CYANOCOBALAMIN) 500 MCG tablet [id number] No  Take 500 mcg by mouth daily. [provider] 05/08/2023 Morning Active Child            Home Care and Equipment/Supplies: Were Home Health Services Ordered?: Yes Name of Home Health Agency::  unknown Has Agency set up a time to come to your home?: No Any new equipment or medical supplies ordered?: NA  Functional Questionnaire: Do you need assistance with bathing/showering or dressing?: No Do you need assistance with meal preparation?: No Do you need assistance with eating?: No Do you have difficulty maintaining continence: No Do you need assistance with getting out of bed/getting out of a chair/moving?: No Do you have difficulty managing or taking your medications?: No  Follow up appointments reviewed: PCP Follow-up appointment confirmed?: No (no avail appts sent message to staff to schedule) MD Provider Line [phone] Given: No Specialist Hospital Follow-up appointment confirmed?: No Reason Specialist Follow-Up Not Confirmed: Patient has Specialist Provider Number and will Call for Appointment Do you need transportation to your follow-up appointment?: No Do you understand care options if your condition(s) worsen?: Yes-patient verbalized understanding    SIGNATURE Karena Addison, LPN [facility] Nurse Health Advisor Direct Dial [phone]

## 2023-05-12 NOTE — Transitions of Care (Post Inpatient/ED Visit) (Signed)
   05/12/2023  Name: Amber Padilla MRN: [id number] DOB: 25-May-1935  Today's TOC FU Call Status: Today's TOC FU Call Status:: Unsuccessful Call (1st Attempt) Unsuccessful Call (1st Attempt) Date: 05/12/23  Attempted to reach the patient regarding the most recent Inpatient/ED visit.  Follow Up Plan: Additional outreach attempts will be made to reach the patient to complete the Transitions of Care (Post Inpatient/ED visit) call.   Signature Karena Addison, LPN [facility] Nurse Health Advisor Direct Dial [phone]

## 2023-05-18 ENCOUNTER — Ambulatory Visit (INDEPENDENT_AMBULATORY_CARE_PROVIDER_SITE_OTHER): Payer: Medicare HMO | Admitting: Internal Medicine

## 2023-05-18 ENCOUNTER — Encounter: Payer: Self-pay | Admitting: Internal Medicine

## 2023-05-18 VITALS — BP 128/78 | HR 57 | Ht 64.0 in | Wt 142.0 lb

## 2023-05-18 DIAGNOSIS — I6930 Unspecified sequelae of cerebral infarction: Secondary | ICD-10-CM

## 2023-05-18 DIAGNOSIS — I10 Essential (primary) hypertension: Secondary | ICD-10-CM | POA: Diagnosis not present

## 2023-05-18 DIAGNOSIS — E1169 Type 2 diabetes mellitus with other specified complication: Secondary | ICD-10-CM

## 2023-05-18 DIAGNOSIS — E118 Type 2 diabetes mellitus with unspecified complications: Secondary | ICD-10-CM

## 2023-05-18 DIAGNOSIS — E785 Hyperlipidemia, unspecified: Secondary | ICD-10-CM

## 2023-05-18 DIAGNOSIS — Z7984 Long term (current) use of oral hypoglycemic drugs: Secondary | ICD-10-CM

## 2023-05-18 NOTE — Progress Notes (Signed)
Date:  05/18/2023   Name:  Amber Padilla   DOB:  1935/09/07   MRN:  [id number]   Chief Complaint: Hospitalization Follow-up Hospital follow up.  Admitted to [facility]  05/09/23 to 05/11/23 with CVA sx.  TOC call done on 05/12/23.  02/11: MRI (+)Small acute left posterior temporal lobe infarct. Echo EF 60-65, MOd high Pulm A sytolic pressure, G2DD, LA and RA dilation, mild/mod MR but no other significant valvular dz. Cardiology to mail [facility].  2.10.25 EEG:  This EEG was obtained while awake and asleep and is normal.   05/10/23 ECHO bubble study:  The  left ventricle has normal function. The left ventricle has no regional  wall motion abnormalities. Left ventricular diastolic parameters are  consistent with Grade II diastolic  dysfunction (pseudonormalization).   2. Right ventricular systolic function is normal. The right ventricular  size is normal. There is moderately elevated pulmonary artery systolic  pressure.   3. Left atrial size was severely dilated.   4. Right atrial size was mildly dilated.   5. The mitral valve is grossly normal. Mild to moderate mitral valve  regurgitation. No evidence of mitral stenosis.   6. Tricuspid valve regurgitation is moderate.   7. The aortic valve is normal in structure. Aortic valve regurgitation is  trivial. No aortic stenosis is present.   ASSESSMENT & PLAN:   Small acute left posterior temporal lobe infarct confirmed MRI 02/10.   Potentially complicated by migraine, HTN encephalopathy.  Less likely for seizure  Appreciate neurology recs  DAPT: ASA + plavix x 21d then plavix alone  increased statin Neuro follow up    Hypertensive urgency - may explain headache also  Record review - BP typically is well controlled, family does not think pt has missed her usual meds Meds as below  Close PCP follow up    Dyslipidemia Statin increased will need recheck this and CMP outpatient    Type 2 diabetes mellitus without complications (HCC) Resume  home meds Outpatient f/u       TAKE these medications     acetaminophen 500 MG tablet Commonly known as: TYLENOL Take 1,000 mg by mouth every 6 (six) hours as needed.    aspirin EC 81 MG tablet Take 1 tablet (81 mg total) by mouth daily for 21 days.    atorvastatin 80 MG tablet Commonly known as: LIPITOR Take 1 tablet (80 mg total) by mouth daily. What changed:  medication strength how much to take    blood glucose meter kit and supplies Dispense based on patient and insurance preference. Use up to four times daily as directed. (FOR ICD-10 E10.9, E11.9).    Calcium Carb-Cholecalciferol 500-3.125 MG-MCG Tabs Take 1 tablet by mouth daily.    CALCIUM PO Take 600 mg by mouth.    calcium-vitamin D 500-200 MG-UNIT tablet Commonly known as: OSCAL WITH D Take 1 tablet by mouth.    cloNIDine 0.2 MG tablet Commonly known as: CATAPRES Take 1 tablet (0.2 mg total) by mouth 3 (three) times daily. What changed: when to take this    clopidogrel 75 MG tablet Commonly known as: PLAVIX Take 1 tablet (75 mg total) by mouth daily. Start taking on: May 12, 2023    doxazosin 4 MG tablet Commonly known as: CARDURA TAKE 1 TABLET DAILY    ferrous sulfate 324 MG Tbec Take 324 mg by mouth daily.    Lancets Misc 1 each by Does not apply route daily at 6 (six) AM.  Magnesium 500 MG Caps Take by mouth.    metFORMIN 500 MG tablet Commonly known as: GLUCOPHAGE TAKE 1 TABLET TWICE DAILY  WITH MEALS **ZYDUS MFR**    metoprolol succinate 100 MG 24 hr tablet Commonly known as: TOPROL-XL TAKE 1 TABLET DAILY WITH ORIMMEDIATELY FOLLOWING A    MEAL    OneTouch Verio test strip Generic drug: glucose blood Use up to 4 times daily as directed E10.9, E11.9    telmisartan 80 MG tablet Commonly known as: MICARDIS TAKE 1 TABLET DAILY    vitamin B-12 500 MCG tablet Commonly known as: CYANOCOBALAMIN Take 500 mcg by mouth daily.    VITAMIN D3 PO Take 1,000 Units by mouth.      Review of Systems  Constitutional:  Negative for chills, fatigue and fever.  HENT:  Negative for trouble swallowing.   Eyes:  Positive for visual disturbance.  Respiratory:  Negative for chest tightness and shortness of breath.   Gastrointestinal:  Negative for constipation, nausea and vomiting.  Genitourinary:  Negative for dysuria and urgency.  Musculoskeletal:  Negative for arthralgias.  Neurological:  Negative for dizziness, light-headedness and headaches.  Psychiatric/Behavioral:  Negative for dysphoric mood. The patient is not nervous/anxious.      Lab Results  Component Value Date   NA 138 05/09/2023   K 3.6 05/09/2023   CO2 21 (L) 05/09/2023   GLUCOSE 96 05/09/2023   BUN 19 05/09/2023   CREATININE 1.18 (H) 05/09/2023   CALCIUM 9.2 05/09/2023   EGFR 48 (L) 01/20/2023   GFRNONAA 45 (L) 05/09/2023   Lab Results  Component Value Date   CHOL 238 (H) 05/09/2023   HDL 59 05/09/2023   LDLCALC 167 (H) 05/09/2023   TRIG 61 05/09/2023   CHOLHDL 4.0 05/09/2023   Lab Results  Component Value Date   TSH 1.316 05/09/2023   Lab Results  Component Value Date   HGBA1C 6.3 (H) 01/20/2023   Lab Results  Component Value Date   WBC 6.6 05/09/2023   HGB 11.4 (L) 05/09/2023   HCT 35.7 (L) 05/09/2023   MCV 94.2 05/09/2023   PLT 143 (L) 05/09/2023   Lab Results  Component Value Date   ALT 8 05/09/2023   AST 18 05/09/2023   ALKPHOS 47 05/09/2023   BILITOT 1.0 05/09/2023   No results found for: "25OHVITD2", "25OHVITD3", "VD25OH"   Patient Active Problem List   Diagnosis Date Noted   History of stroke with residual deficit 05/18/2023   TIA (transient ischemic attack) 05/09/2023   Dyslipidemia 05/09/2023   Type 2 diabetes mellitus without complications (HCC) 05/09/2023   Proteinuria due to type 2 diabetes mellitus (HCC) 12/31/2021   Osteopenia determined by x-ray 07/04/2021   Horseshoe kidney with renal calculus 07/25/2020   Aortic atherosclerosis (HCC) 07/25/2020    Hypertensive urgency    Type II diabetes mellitus with complication (HCC) 12/28/2018   Essential hypertension 12/28/2018   Hyperlipidemia associated with type 2 diabetes mellitus (HCC) 12/28/2018    Allergies  Allergen Reactions   Amlodipine Swelling    Other Reaction(s): Edema   Propoxyphene Rash    Past Surgical History:  Procedure Laterality Date   BUNIONECTOMY Bilateral 2005    Social History   Tobacco Use   Smoking status: Never   Smokeless tobacco: Never  Vaping Use   Vaping status: Never Used  Substance Use Topics   Alcohol use: Never   Drug use: Never     Medication list has been reviewed and updated.  Current Meds  Medication Sig   acetaminophen (TYLENOL) 500 MG tablet Take 1,000 mg by mouth every 6 (six) hours as needed.   aspirin EC 81 MG tablet Take 1 tablet (81 mg total) by mouth daily for 21 days.   atorvastatin (LIPITOR) 80 MG tablet Take 1 tablet (80 mg total) by mouth daily.   blood glucose meter kit and supplies Dispense based on patient and insurance preference. Use up to four times daily as directed. (FOR ICD-10 E10.9, E11.9).   Calcium Carb-Cholecalciferol 500-3.125 MG-MCG TABS Take 1 tablet by mouth daily.   CALCIUM PO Take 600 mg by mouth.   calcium-vitamin D (OSCAL WITH D) 500-200 MG-UNIT tablet Take 1 tablet by mouth.   Cholecalciferol (VITAMIN D3 PO) Take 1,000 Units by mouth.   cloNIDine (CATAPRES) 0.2 MG tablet Take 1 tablet (0.2 mg total) by mouth 3 (three) times daily.   clopidogrel (PLAVIX) 75 MG tablet Take 1 tablet (75 mg total) by mouth daily.   doxazosin (CARDURA) 4 MG tablet TAKE 1 TABLET DAILY   ferrous sulfate 324 MG TBEC Take 324 mg by mouth daily.   Lancets MISC 1 each by Does not apply route daily at 6 (six) AM.   Magnesium 500 MG CAPS Take by mouth.   metFORMIN (GLUCOPHAGE) 500 MG tablet TAKE 1 TABLET TWICE DAILY  WITH MEALS **ZYDUS MFR**   metoprolol succinate (TOPROL-XL) 100 MG 24 hr tablet TAKE 1 TABLET DAILY WITH  ORIMMEDIATELY FOLLOWING A    MEAL   ONETOUCH VERIO test strip Use up to 4 times daily as directed E10.9, E11.9   telmisartan (MICARDIS) 80 MG tablet TAKE 1 TABLET DAILY   vitamin B-12 (CYANOCOBALAMIN) 500 MCG tablet Take 500 mcg by mouth daily.       05/18/2023    2:15 PM 01/20/2023   10:26 AM 12/31/2021   10:18 AM 07/11/2021    9:22 AM  GAD 7 : Generalized Anxiety Score  Nervous, Anxious, on Edge 0 0 0 0  Control/stop worrying 0 0 0 0  Worry too much - different things 0 0 0 0  Trouble relaxing 0 0 0 0  Restless 0 0 0 0  Easily annoyed or irritable 0 0 0 0  Afraid - awful might happen 0 0 0 0  Total GAD 7 Score 0 0 0 0  Anxiety Difficulty Not difficult at all Not difficult at all Not difficult at all        05/18/2023    2:15 PM 01/20/2023   10:26 AM 07/02/2022   10:58 AM  Depression screen PHQ 2/9  Decreased Interest 0 0 0  Down, Depressed, Hopeless 0 0 0  PHQ - 2 Score 0 0 0  Altered sleeping 2 0   Tired, decreased energy 0 0   Change in appetite 0 0   Feeling bad or failure about yourself  0 0   Trouble concentrating 0 0   Moving slowly or fidgety/restless 0 0   Suicidal thoughts 0 0   PHQ-9 Score 2 0   Difficult doing work/chores Not difficult at all Not difficult at all     BP Readings from Last 3 Encounters:  05/18/23 128/78  05/11/23 (!) 163/76  01/20/23 (!) 140/85    Physical Exam Vitals and nursing note reviewed.  Constitutional:      General: She is not in acute distress.    Appearance: Normal appearance. She is well-developed.  HENT:     Head: Normocephalic and atraumatic.  Cardiovascular:  Rate and Rhythm: Normal rate and regular rhythm.     Heart sounds: No murmur heard. Pulmonary:     Effort: Pulmonary effort is normal. No respiratory distress.     Breath sounds: No wheezing or rhonchi.  Musculoskeletal:     Cervical back: Normal range of motion.     Right lower leg: No edema.     Left lower leg: No edema.  Lymphadenopathy:     Cervical:  No cervical adenopathy.  Skin:    General: Skin is warm and dry.     Capillary Refill: Capillary refill takes less than 2 seconds.     Findings: No rash.  Neurological:     General: No focal deficit present.     Mental Status: She is alert and oriented to person, place, and time.  Psychiatric:        Mood and Affect: Mood normal.        Behavior: Behavior normal.     Wt Readings from Last 3 Encounters:  05/18/23 142 lb (64.4 kg)  05/09/23 139 lb 1.8 oz (63.1 kg)  01/20/23 141 lb (64 kg)    BP 128/78   Pulse (!) 57   Ht 5\' 4"  (1.626 m)   Wt 142 lb (64.4 kg)   SpO2 95%   BMI 24.37 kg/m   Assessment and Plan:  Problem List Items Addressed This Visit       Unprioritized   Essential hypertension (Chronic)   BP controlled on metoprolol and telmisartan.      Relevant Orders   Comprehensive metabolic panel   Hyperlipidemia associated with type 2 diabetes mellitus (HCC) (Chronic)   Recent increase in statin dose at discharge for CVA Now on atorvastatin 80 mg.      Relevant Orders   Lipid panel   Type II diabetes mellitus with complication (HCC)   Relevant Orders   Hemoglobin A1c   History of stroke with residual deficit - Primary   Still has vision changes and is being referred to a retinal specialist No further speech issues or gait issues. Awaiting cardiac monitor. Will refer to Neurology.       Relevant Orders   Comprehensive metabolic panel   Ambulatory referral to Neurology   CBC with Differential/Platelet    No follow-ups on file.    Reubin Milan, MD [facility] [place]

## 2023-05-18 NOTE — Assessment & Plan Note (Addendum)
Still has vision changes and is being referred to a retinal specialist No further speech issues or gait issues. Awaiting cardiac monitor. Will refer to Neurology.

## 2023-05-18 NOTE — Assessment & Plan Note (Signed)
Recent increase in statin dose at discharge for CVA Now on atorvastatin 80 mg.

## 2023-05-18 NOTE — Assessment & Plan Note (Signed)
BP controlled on metoprolol and telmisartan.

## 2023-05-19 LAB — CBC WITH DIFFERENTIAL/PLATELET
Basophils Absolute: 0 10*3/uL (ref 0.0–0.2)
Basos: 1 %
EOS (ABSOLUTE): 0.2 10*3/uL (ref 0.0–0.4)
Eos: 4 %
Hematocrit: 33.1 % — ABNORMAL LOW (ref 34.0–46.6)
Hemoglobin: 10.6 g/dL — ABNORMAL LOW (ref 11.1–15.9)
Immature Grans (Abs): 0 10*3/uL (ref 0.0–0.1)
Immature Granulocytes: 0 %
Lymphocytes Absolute: 1.6 10*3/uL (ref 0.7–3.1)
Lymphs: 34 %
MCH: 30.2 pg (ref 26.6–33.0)
MCHC: 32 g/dL (ref 31.5–35.7)
MCV: 94 fL (ref 79–97)
Monocytes Absolute: 0.5 10*3/uL (ref 0.1–0.9)
Monocytes: 10 %
Neutrophils Absolute: 2.3 10*3/uL (ref 1.4–7.0)
Neutrophils: 51 %
Platelets: 174 10*3/uL (ref 150–450)
RBC: 3.51 x10E6/uL — ABNORMAL LOW (ref 3.77–5.28)
RDW: 13.9 % (ref 11.7–15.4)
WBC: 4.5 10*3/uL (ref 3.4–10.8)

## 2023-05-19 LAB — COMPREHENSIVE METABOLIC PANEL
ALT: 9 [IU]/L (ref 0–32)
AST: 16 [IU]/L (ref 0–40)
Albumin: 4 g/dL (ref 3.7–4.7)
Alkaline Phosphatase: 72 [IU]/L (ref 44–121)
BUN/Creatinine Ratio: 12 (ref 12–28)
BUN: 14 mg/dL (ref 8–27)
Bilirubin Total: 0.5 mg/dL (ref 0.0–1.2)
CO2: 27 mmol/L (ref 20–29)
Calcium: 9.5 mg/dL (ref 8.7–10.3)
Chloride: 101 mmol/L (ref 96–106)
Creatinine, Ser: 1.13 mg/dL — ABNORMAL HIGH (ref 0.57–1.00)
Globulin, Total: 3.5 g/dL (ref 1.5–4.5)
Glucose: 57 mg/dL — ABNORMAL LOW (ref 70–99)
Potassium: 4.8 mmol/L (ref 3.5–5.2)
Sodium: 142 mmol/L (ref 134–144)
Total Protein: 7.5 g/dL (ref 6.0–8.5)
eGFR: 48 mL/min/{1.73_m2} — ABNORMAL LOW (ref >59)

## 2023-05-19 LAB — HEMOGLOBIN A1C
Est. average glucose Bld gHb Est-mCnc: 128 mg/dL
Hgb A1c MFr Bld: 6.1 % — ABNORMAL HIGH (ref 4.8–5.6)

## 2023-05-19 LAB — LIPID PANEL
Chol/HDL Ratio: 2.7 {ratio} (ref 0.0–4.4)
Cholesterol, Total: 156 mg/dL (ref 100–199)
HDL: 58 mg/dL (ref >39)
LDL Chol Calc (NIH): 85 mg/dL (ref 0–99)
Triglycerides: 65 mg/dL (ref 0–149)
VLDL Cholesterol Cal: 13 mg/dL (ref 5–40)

## 2023-05-20 ENCOUNTER — Encounter: Payer: Self-pay | Admitting: Internal Medicine

## 2023-05-25 ENCOUNTER — Other Ambulatory Visit (INDEPENDENT_AMBULATORY_CARE_PROVIDER_SITE_OTHER): Payer: Medicare HMO | Admitting: Internal Medicine

## 2023-05-25 ENCOUNTER — Ambulatory Visit: Payer: Medicare HMO | Admitting: Internal Medicine

## 2023-05-25 DIAGNOSIS — E1169 Type 2 diabetes mellitus with other specified complication: Secondary | ICD-10-CM

## 2023-05-25 DIAGNOSIS — Z87442 Personal history of urinary calculi: Secondary | ICD-10-CM

## 2023-05-25 DIAGNOSIS — I10 Essential (primary) hypertension: Secondary | ICD-10-CM

## 2023-05-25 DIAGNOSIS — I700 Atherosclerosis of aorta: Secondary | ICD-10-CM

## 2023-05-25 DIAGNOSIS — I69398 Other sequelae of cerebral infarction: Secondary | ICD-10-CM

## 2023-05-25 DIAGNOSIS — I69320 Aphasia following cerebral infarction: Secondary | ICD-10-CM

## 2023-05-25 DIAGNOSIS — M8580 Other specified disorders of bone density and structure, unspecified site: Secondary | ICD-10-CM

## 2023-05-25 DIAGNOSIS — R32 Unspecified urinary incontinence: Secondary | ICD-10-CM | POA: Insufficient documentation

## 2023-05-25 DIAGNOSIS — N3941 Urge incontinence: Secondary | ICD-10-CM

## 2023-05-25 DIAGNOSIS — R519 Headache, unspecified: Secondary | ICD-10-CM | POA: Insufficient documentation

## 2023-05-25 DIAGNOSIS — G459 Transient cerebral ischemic attack, unspecified: Secondary | ICD-10-CM

## 2023-05-25 DIAGNOSIS — I69391 Dysphagia following cerebral infarction: Secondary | ICD-10-CM

## 2023-05-25 DIAGNOSIS — G441 Vascular headache, not elsewhere classified: Secondary | ICD-10-CM

## 2023-05-25 DIAGNOSIS — E118 Type 2 diabetes mellitus with unspecified complications: Secondary | ICD-10-CM

## 2023-05-25 NOTE — Progress Notes (Signed)
 Received home health orders orders from [facility]. Start of care 05/13/23.   Certification and orders from 05/13/23 through 07/11/23 are reviewed, signed and faxed back to home health company.  Need of intermittent skilled services at home:   The home health care plan has been established by me and will be reviewed and updated as needed to maximize patient recovery.  I certify that all home health services have been and will be furnished to the patient while under my care.  Face-to-face encounter in which the need for home health services was established: at [facility] 05/18/23 and hospital discharge.  Patient is receiving home health services for the following diagnoses: Problem List Items Addressed This Visit       Unprioritized   Type II diabetes mellitus with complication (HCC)   Essential hypertension (Chronic)   Hyperlipidemia associated with type 2 diabetes mellitus (HCC) (Chronic)   Aortic atherosclerosis (HCC) (Chronic)   Osteopenia determined by x-ray   TIA (transient ischemic attack)   Dysphagia status post cerebrovascular accident - Primary   Aphasia following cerebral infarction   Vertigo following cerebrovascular accident   Head pain cephalgia   Urine incontinence   Personal history of urinary calculi     Bari Edward, MD

## 2023-05-28 ENCOUNTER — Ambulatory Visit: Payer: Self-pay | Admitting: Internal Medicine

## 2023-05-28 NOTE — Telephone Encounter (Signed)
 3rd attempt to call patient with no answer. Left voicemail for callback. Routing to clinic   Copied from CRM [id number]. Topic: Clinical - Medical Advice >> May 28, 2023  3:50 PM Amber Padilla wrote: Reason for CRM: patient called stated she is having pain in the right side of her ankle after taking clopidogrel (PLAVIX) 75 MG tablet and atorvastatin (LIPITOR) 80 MG tablet. Please f/u with patient

## 2023-05-28 NOTE — Telephone Encounter (Signed)
 Copied from CRM [id number]. Topic: Clinical - Medical Advice >> May 28, 2023  3:50 PM Patsy Lager T wrote: Reason for CRM: patient called stated she is having pain in the right side of her ankle after taking clopidogrel (PLAVIX) 75 MG tablet and atorvastatin (LIPITOR) 80 MG tablet. Please f/u with patient

## 2023-05-31 ENCOUNTER — Other Ambulatory Visit: Payer: Self-pay

## 2023-05-31 ENCOUNTER — Encounter (INDEPENDENT_AMBULATORY_CARE_PROVIDER_SITE_OTHER): Payer: Medicare HMO | Admitting: Ophthalmology

## 2023-05-31 ENCOUNTER — Ambulatory Visit: Attending: Internal Medicine

## 2023-05-31 ENCOUNTER — Telehealth: Payer: Self-pay

## 2023-05-31 DIAGNOSIS — E113312 Type 2 diabetes mellitus with moderate nonproliferative diabetic retinopathy with macular edema, left eye: Secondary | ICD-10-CM

## 2023-05-31 DIAGNOSIS — H353112 Nonexudative age-related macular degeneration, right eye, intermediate dry stage: Secondary | ICD-10-CM | POA: Diagnosis not present

## 2023-05-31 DIAGNOSIS — I69320 Aphasia following cerebral infarction: Secondary | ICD-10-CM

## 2023-05-31 DIAGNOSIS — I10 Essential (primary) hypertension: Secondary | ICD-10-CM

## 2023-05-31 DIAGNOSIS — Z7984 Long term (current) use of oral hypoglycemic drugs: Secondary | ICD-10-CM

## 2023-05-31 DIAGNOSIS — I69391 Dysphagia following cerebral infarction: Secondary | ICD-10-CM

## 2023-05-31 DIAGNOSIS — H43813 Vitreous degeneration, bilateral: Secondary | ICD-10-CM

## 2023-05-31 DIAGNOSIS — H35033 Hypertensive retinopathy, bilateral: Secondary | ICD-10-CM

## 2023-05-31 NOTE — Telephone Encounter (Signed)
 Done

## 2023-05-31 NOTE — Telephone Encounter (Signed)
 I do not see a heart monitor being ordered for this patient? Please advise?  Copied from CRM [id number]. Topic: Clinical - Medication Question >> May 31, 2023  9:46 AM Carlatta H wrote: Reason for CRM: Patient called to advised that heart monitor was never received//She wanted to know if she still needed it

## 2023-05-31 NOTE — Telephone Encounter (Signed)
 Patient informed to cut atorvastatin in half- 40 mg daily. She will try this. She has a follow up appt in one month and she will follow up then if still has ankle pain.  - Amber Padilla

## 2023-05-31 NOTE — Telephone Encounter (Signed)
 Please review.  KP

## 2023-06-07 DIAGNOSIS — I69391 Dysphagia following cerebral infarction: Secondary | ICD-10-CM | POA: Diagnosis not present

## 2023-06-07 DIAGNOSIS — I69320 Aphasia following cerebral infarction: Secondary | ICD-10-CM

## 2023-06-18 ENCOUNTER — Other Ambulatory Visit: Payer: Self-pay

## 2023-06-18 DIAGNOSIS — I10 Essential (primary) hypertension: Secondary | ICD-10-CM

## 2023-06-18 MED ORDER — CLONIDINE HCL 0.2 MG PO TABS: 0.2000 mg | ORAL_TABLET | Freq: Three times a day (TID) | ORAL | 1 refills | Status: DC

## 2023-06-18 MED ORDER — TELMISARTAN 80 MG PO TABS: 80.0000 mg | ORAL_TABLET | Freq: Every day | ORAL | 1 refills | Status: DC

## 2023-06-18 MED ORDER — METOPROLOL SUCCINATE ER 100 MG PO TB24: ORAL_TABLET | ORAL | 1 refills | Status: DC

## 2023-06-18 MED ORDER — DOXAZOSIN MESYLATE 4 MG PO TABS: 4.0000 mg | ORAL_TABLET | Freq: Every day | ORAL | 1 refills | Status: DC

## 2023-06-18 NOTE — Telephone Encounter (Signed)
 MED REFILL.

## 2023-06-28 ENCOUNTER — Encounter (INDEPENDENT_AMBULATORY_CARE_PROVIDER_SITE_OTHER): Admitting: Ophthalmology

## 2023-06-30 ENCOUNTER — Encounter (INDEPENDENT_AMBULATORY_CARE_PROVIDER_SITE_OTHER): Admitting: Ophthalmology

## 2023-06-30 ENCOUNTER — Encounter: Payer: Self-pay | Admitting: Internal Medicine

## 2023-06-30 ENCOUNTER — Ambulatory Visit: Payer: Self-pay | Admitting: Internal Medicine

## 2023-07-05 ENCOUNTER — Telehealth: Payer: Self-pay | Admitting: Internal Medicine

## 2023-07-05 NOTE — Telephone Encounter (Signed)
 Copied from CRM [id number]. Topic: Medicare AWV >> Jul 05, 2023 10:03 AM Payton Doughty wrote: Reason for CRM: Called LVM 07/05/2023 to schedule AWV. Please schedule Virtual or Telehealth visits ONLY.   Verlee Rossetti; Care Guide Ambulatory Clinical Support [place] [facility] Direct Dial: [phone]

## 2023-07-09 ENCOUNTER — Ambulatory Visit: Admitting: Internal Medicine

## 2023-07-09 ENCOUNTER — Telehealth: Payer: Self-pay

## 2023-07-09 ENCOUNTER — Encounter: Payer: Self-pay | Admitting: Internal Medicine

## 2023-07-09 VITALS — BP 138/78 | HR 62 | Ht 64.0 in | Wt 140.5 lb

## 2023-07-09 DIAGNOSIS — I10 Essential (primary) hypertension: Secondary | ICD-10-CM

## 2023-07-09 DIAGNOSIS — E785 Hyperlipidemia, unspecified: Secondary | ICD-10-CM

## 2023-07-09 DIAGNOSIS — Z7984 Long term (current) use of oral hypoglycemic drugs: Secondary | ICD-10-CM

## 2023-07-09 DIAGNOSIS — I6930 Unspecified sequelae of cerebral infarction: Secondary | ICD-10-CM

## 2023-07-09 DIAGNOSIS — E1169 Type 2 diabetes mellitus with other specified complication: Secondary | ICD-10-CM

## 2023-07-09 NOTE — Assessment & Plan Note (Signed)
 blood pressure fairly well controlled on 4 agents Readings at home in the 130s/80s

## 2023-07-09 NOTE — Progress Notes (Signed)
 Date:  07/09/2023   Name:  Amber Padilla   DOB:  [birth year]   MRN:  [id number]   Chief Complaint: Hypertension  Hypertension This is a chronic problem. The problem is controlled. Pertinent negatives include no chest pain, headaches, palpitations or shortness of breath. Past treatments include angiotensin blockers, central alpha agonists, beta blockers and alpha 1 blockers.  Hyperlipidemia This is a chronic problem. The problem is controlled. Pertinent negatives include no chest pain or shortness of breath. Current antihyperlipidemic treatment includes statins.    Review of Systems  Constitutional:  Negative for appetite change, fatigue, fever and unexpected weight change.  HENT:  Negative for tinnitus and trouble swallowing.   Eyes:  Negative for visual disturbance.  Respiratory:  Negative for cough, chest tightness and shortness of breath.   Cardiovascular:  Negative for chest pain, palpitations and leg swelling.  Gastrointestinal:  Negative for abdominal pain.  Endocrine: Negative for polydipsia and polyuria.  Genitourinary:  Negative for dysuria and hematuria.  Musculoskeletal:  Negative for arthralgias.  Neurological:  Negative for tremors, numbness and headaches.  Psychiatric/Behavioral:  Negative for dysphoric mood.      Lab Results  Component Value Date   NA 142 05/18/2023   K 4.8 05/18/2023   CO2 27 05/18/2023   GLUCOSE 57 (L) 05/18/2023   BUN 14 05/18/2023   CREATININE 1.13 (H) 05/18/2023   CALCIUM 9.5 05/18/2023   EGFR 48 (L) 05/18/2023   GFRNONAA 45 (L) 05/09/2023   Lab Results  Component Value Date   CHOL 156 05/18/2023   HDL 58 05/18/2023   LDLCALC 85 05/18/2023   TRIG 65 05/18/2023   CHOLHDL 2.7 05/18/2023   Lab Results  Component Value Date   TSH 1.316 05/09/2023   Lab Results  Component Value Date   HGBA1C 6.1 (H) 05/18/2023   Lab Results  Component Value Date   WBC 4.5 05/18/2023   HGB 10.6 (L) 05/18/2023   HCT 33.1 (L) 05/18/2023   MCV  94 05/18/2023   PLT 174 05/18/2023   Lab Results  Component Value Date   ALT 9 05/18/2023   AST 16 05/18/2023   ALKPHOS 72 05/18/2023   BILITOT 0.5 05/18/2023   No results found for: "25OHVITD2", "25OHVITD3", "VD25OH"   Patient Active Problem List   Diagnosis Date Noted   Dysphagia status post cerebrovascular accident 05/25/2023   Aphasia following cerebral infarction 05/25/2023   Vertigo following cerebrovascular accident 05/25/2023   Head pain cephalgia 05/25/2023   Urine incontinence 05/25/2023   Personal history of urinary calculi 05/25/2023   History of stroke with residual deficit 05/18/2023   TIA (transient ischemic attack) 05/09/2023   Proteinuria due to type 2 diabetes mellitus (HCC) 12/31/2021   Osteopenia determined by x-ray 07/04/2021   Horseshoe kidney with renal calculus 07/25/2020   Aortic atherosclerosis (HCC) 07/25/2020   Type II diabetes mellitus with complication (HCC) 12/28/2018   Essential hypertension 12/28/2018   Hyperlipidemia associated with type 2 diabetes mellitus (HCC) 12/28/2018    Allergies  Allergen Reactions   Amlodipine Swelling    Other Reaction(s): Edema   Propoxyphene Rash    Past Surgical History:  Procedure Laterality Date   BUNIONECTOMY Bilateral 2005    Social History   Tobacco Use   Smoking status: Never   Smokeless tobacco: Never  Vaping Use   Vaping status: Never Used  Substance Use Topics   Alcohol use: Never   Drug use: Never     Medication list has been reviewed and  updated.  Current Meds  Medication Sig   acetaminophen (TYLENOL) 500 MG tablet Take 1,000 mg by mouth every 6 (six) hours as needed.   atorvastatin (LIPITOR) 80 MG tablet Take 1 tablet (80 mg total) by mouth daily. (Patient taking differently: Take 40 mg by mouth daily.)   blood glucose meter kit and supplies Dispense based on patient and insurance preference. Use up to four times daily as directed. (FOR ICD-10 E10.9, E11.9).   Calcium  Carb-Cholecalciferol 500-3.125 MG-MCG TABS Take 1 tablet by mouth daily.   CALCIUM PO Take 600 mg by mouth.   calcium-vitamin D (OSCAL WITH D) 500-200 MG-UNIT tablet Take 1 tablet by mouth.   Cholecalciferol (VITAMIN D3 PO) Take 1,000 Units by mouth.   cloNIDine (CATAPRES) 0.2 MG tablet Take 1 tablet (0.2 mg total) by mouth 3 (three) times daily.   clopidogrel (PLAVIX) 75 MG tablet Take 1 tablet (75 mg total) by mouth daily.   doxazosin (CARDURA) 4 MG tablet Take 1 tablet (4 mg total) by mouth daily.   ferrous sulfate 324 MG TBEC Take 324 mg by mouth daily.   Lancets MISC 1 each by Does not apply route daily at 6 (six) AM.   Magnesium 500 MG CAPS Take by mouth.   metFORMIN (GLUCOPHAGE) 500 MG tablet TAKE 1 TABLET TWICE DAILY  WITH MEALS **ZYDUS MFR**   metoprolol succinate (TOPROL-XL) 100 MG 24 hr tablet TAKE 1 TABLET DAILY WITH ORIMMEDIATELY FOLLOWING A    MEAL   ONETOUCH VERIO test strip Use up to 4 times daily as directed E10.9, E11.9   telmisartan (MICARDIS) 80 MG tablet Take 1 tablet (80 mg total) by mouth daily.   vitamin B-12 (CYANOCOBALAMIN) 500 MCG tablet Take 500 mcg by mouth daily.       07/09/2023    1:29 PM 05/18/2023    2:15 PM 01/20/2023   10:26 AM 12/31/2021   10:18 AM  GAD 7 : Generalized Anxiety Score  Nervous, Anxious, on Edge 0 0 0 0  Control/stop worrying 0 0 0 0  Worry too much - different things 0 0 0 0  Trouble relaxing 0 0 0 0  Restless 0 0 0 0  Easily annoyed or irritable 0 0 0 0  Afraid - awful might happen 0 0 0 0  Total GAD 7 Score 0 0 0 0  Anxiety Difficulty Not difficult at all Not difficult at all Not difficult at all Not difficult at all       07/09/2023    1:29 PM 05/18/2023    2:15 PM 01/20/2023   10:26 AM  Depression screen PHQ 2/9  Decreased Interest 0 0 0  Down, Depressed, Hopeless 0 0 0  PHQ - 2 Score 0 0 0  Altered sleeping 1 2 0  Tired, decreased energy 1 0 0  Change in appetite 0 0 0  Feeling bad or failure about yourself  0 0 0   Trouble concentrating 0 0 0  Moving slowly or fidgety/restless 0 0 0  Suicidal thoughts 0 0 0  PHQ-9 Score 2 2 0  Difficult doing work/chores Not difficult at all Not difficult at all Not difficult at all    BP Readings from Last 3 Encounters:  07/09/23 138/78  05/18/23 128/78  05/11/23 (!) 163/76    Physical Exam Vitals and nursing note reviewed.  Constitutional:      General: She is not in acute distress.    Appearance: Normal appearance. She is well-developed.  HENT:  Head: Normocephalic and atraumatic.  Cardiovascular:     Rate and Rhythm: Normal rate and regular rhythm.     Heart sounds: No murmur heard. Pulmonary:     Effort: Pulmonary effort is normal. No respiratory distress.     Breath sounds: No wheezing or rhonchi.  Musculoskeletal:     Cervical back: Normal range of motion.     Right lower leg: No edema.     Left lower leg: No edema.  Skin:    General: Skin is warm and dry.     Findings: No rash.  Neurological:     General: No focal deficit present.     Mental Status: She is alert and oriented to person, place, and time.  Psychiatric:        Mood and Affect: Mood normal.        Behavior: Behavior normal.     Wt Readings from Last 3 Encounters:  07/09/23 140 lb 8 oz (63.7 kg)  05/18/23 142 lb (64.4 kg)  05/09/23 139 lb 1.8 oz (63.1 kg)    BP 138/78   Pulse 62   Ht 5\' 4"  (1.626 m)   Wt 140 lb 8 oz (63.7 kg)   SpO2 97%   BMI 24.12 kg/m   Assessment and Plan:  Problem List Items Addressed This Visit       Unprioritized   Essential hypertension (Chronic)   blood pressure fairly well controlled on 4 agents Readings at home in the 130s/80s      Hyperlipidemia associated with type 2 diabetes mellitus (HCC) (Chronic)   Did not tolerate 80 mg atorvastatin She did reduce dose to 40 mg and doing well.      History of stroke with residual deficit - Primary   She reports doing well - no concerns.  She decided not to see Neurology due to too  many doctor's visits and getting eye injections monthly. She is tolerating atorvastatin 40 mg BP at home are stable  No cardiac symptoms or new neuro deficit       Return in about 4 months (around 11/08/2023) for HTN, DM.    Reubin Milan, MD [facility] [place]

## 2023-07-09 NOTE — Assessment & Plan Note (Signed)
 Did not tolerate 80 mg atorvastatin She did reduce dose to 40 mg and doing well.

## 2023-07-09 NOTE — Patient Instructions (Signed)
 Take 4 of the 10 mg to equal 40 until gone then call me for the new prescription.

## 2023-07-09 NOTE — Assessment & Plan Note (Signed)
 She reports doing well - no concerns.  She decided not to see Neurology due to too many doctor's visits and getting eye injections monthly. She is tolerating atorvastatin 40 mg BP at home are stable  No cardiac symptoms or new neuro deficit

## 2023-07-09 NOTE — Telephone Encounter (Signed)
 Noted pt was seen for appt.  KP  Copied from CRM [id number]. Topic: General - Other >> Jul 09, 2023  1:10 PM Amber Padilla wrote: Reason for CRM: pt is outside clinic and called in t orequest for someone to open the door Called [facility] and spoke to [place] asked if they could open door she said they need to pull the door open. Pt had hung up attempted to call back 2 times, no answer .  Please let pt in at front door.

## 2023-07-28 ENCOUNTER — Encounter (INDEPENDENT_AMBULATORY_CARE_PROVIDER_SITE_OTHER): Admitting: Ophthalmology

## 2023-07-28 DIAGNOSIS — H35033 Hypertensive retinopathy, bilateral: Secondary | ICD-10-CM

## 2023-07-28 DIAGNOSIS — H353112 Nonexudative age-related macular degeneration, right eye, intermediate dry stage: Secondary | ICD-10-CM

## 2023-07-28 DIAGNOSIS — I10 Essential (primary) hypertension: Secondary | ICD-10-CM

## 2023-07-28 DIAGNOSIS — E113311 Type 2 diabetes mellitus with moderate nonproliferative diabetic retinopathy with macular edema, right eye: Secondary | ICD-10-CM | POA: Diagnosis not present

## 2023-07-28 DIAGNOSIS — E113392 Type 2 diabetes mellitus with moderate nonproliferative diabetic retinopathy without macular edema, left eye: Secondary | ICD-10-CM

## 2023-07-28 DIAGNOSIS — H43813 Vitreous degeneration, bilateral: Secondary | ICD-10-CM

## 2023-07-28 DIAGNOSIS — Z7984 Long term (current) use of oral hypoglycemic drugs: Secondary | ICD-10-CM | POA: Diagnosis not present

## 2023-08-17 LAB — OPHTHALMOLOGY REPORT-SCANNED

## 2023-08-25 ENCOUNTER — Encounter (INDEPENDENT_AMBULATORY_CARE_PROVIDER_SITE_OTHER): Admitting: Ophthalmology

## 2023-09-02 ENCOUNTER — Encounter (INDEPENDENT_AMBULATORY_CARE_PROVIDER_SITE_OTHER): Admitting: Ophthalmology

## 2023-09-02 ENCOUNTER — Other Ambulatory Visit: Payer: Self-pay | Admitting: Internal Medicine

## 2023-09-02 DIAGNOSIS — E118 Type 2 diabetes mellitus with unspecified complications: Secondary | ICD-10-CM

## 2023-09-03 ENCOUNTER — Encounter: Payer: Self-pay | Admitting: Internal Medicine

## 2023-09-03 NOTE — Telephone Encounter (Signed)
 Please review.  KP

## 2023-09-03 NOTE — Telephone Encounter (Signed)
 Requested medications are due for refill today.  yes  Requested medications are on the active medications list.  yes  Last refill. 07/17/2022 #180 1 rf  Future visit scheduled.   no  Notes to clinic.  Pt has not refilled medication is awhile. Unclear if pt is supposed to be taking this medication. Please advise.    Requested Prescriptions  Pending Prescriptions Disp Refills   metFORMIN  (GLUCOPHAGE ) 500 MG tablet [Pharmacy Med Name: METFORMIN  TAB 500MG ] 180 tablet 1    Sig: TAKE 1 TABLET TWICE DAILY  WITH MEALS **ZYDUS MFR**     Endocrinology:  Diabetes - Biguanides Failed - 09/03/2023 10:44 AM      Failed - Cr in normal range and within 360 days    Creatinine, Ser  Date Value Ref Range Status  05/18/2023 1.13 (H) 0.57 - 1.00 mg/dL Final         Failed - eGFR in normal range and within 360 days    GFR calc Af Amer  Date Value Ref Range Status  12/27/2019 62 >59 mL/min/1.73 Final    Comment:    **[facility] currently reports eGFR in compliance with the current**   recommendations of the [employer]. [facility] will   update reporting as new guidelines are published from the NKF-ASN   Task force.    GFR, Estimated  Date Value Ref Range Status  05/09/2023 45 (L) >60 mL/min Final    Comment:    (NOTE) Calculated using the CKD-EPI Creatinine Equation (2021)    eGFR  Date Value Ref Range Status  05/18/2023 48 (L) >59 mL/min/1.73 Final         Failed - B12 Level in normal range and within 720 days    Vitamin B-12  Date Value Ref Range Status  05/09/2023 3,369 (H) 180 - 914 pg/mL Final    Comment:    (NOTE) This assay is not validated for testing neonatal or myeloproliferative syndrome specimens for Vitamin B12 levels. Performed at [facility], [address], [place], [place] [address]          Passed - HBA1C is between 0 and 7.9 and within 180 days    Hemoglobin A1C  Date Value Ref Range Status  03/10/2016 7.0  Final   Hgb A1c MFr Bld  Date  Value Ref Range Status  05/18/2023 6.1 (H) 4.8 - 5.6 % Final    Comment:             Prediabetes: 5.7 - 6.4          Diabetes: >6.4          Glycemic control for adults with diabetes: <7.0          Passed - Valid encounter within last 6 months    Recent Outpatient Visits           1 month ago History of stroke with residual deficit   [facility] Health Primary Care & Sports Medicine at [facility], Chales [place], MD   3 months ago History of stroke with residual deficit   [facility] Health Primary Care & Sports Medicine at [facility], Chales [place], MD              Passed - CBC within normal limits and completed in the last 12 months    WBC  Date Value Ref Range Status  05/18/2023 4.5 3.4 - 10.8 x10E3/uL Final  05/09/2023 6.6 4.0 - 10.5 K/uL Final   RBC  Date  Value Ref Range Status  05/18/2023 3.51 (L) 3.77 - 5.28 x10E6/uL Final  05/09/2023 3.79 (L) 3.87 - 5.11 MIL/uL Final   Hemoglobin  Date Value Ref Range Status  05/18/2023 10.6 (L) 11.1 - 15.9 g/dL Final   Hematocrit  Date Value Ref Range Status  05/18/2023 33.1 (L) 34.0 - 46.6 % Final   MCHC  Date Value Ref Range Status  05/18/2023 32.0 31.5 - 35.7 g/dL Final  29/56/2130 86.5 30.0 - 36.0 g/dL Final   [facility]  Date Value Ref Range Status  05/18/2023 30.2 26.6 - 33.0 pg Final  05/09/2023 30.1 26.0 - 34.0 pg Final   MCV  Date Value Ref Range Status  05/18/2023 94 79 - 97 fL Final   No results found for: "PLTCOUNTKUC", "LABPLAT", "POCPLA" RDW  Date Value Ref Range Status  05/18/2023 13.9 11.7 - 15.4 % Final

## 2023-09-08 ENCOUNTER — Encounter (INDEPENDENT_AMBULATORY_CARE_PROVIDER_SITE_OTHER): Admitting: Ophthalmology

## 2023-09-09 ENCOUNTER — Encounter (INDEPENDENT_AMBULATORY_CARE_PROVIDER_SITE_OTHER): Admitting: Ophthalmology

## 2023-09-09 DIAGNOSIS — E113391 Type 2 diabetes mellitus with moderate nonproliferative diabetic retinopathy without macular edema, right eye: Secondary | ICD-10-CM | POA: Diagnosis not present

## 2023-09-09 DIAGNOSIS — Z7984 Long term (current) use of oral hypoglycemic drugs: Secondary | ICD-10-CM

## 2023-09-09 DIAGNOSIS — H35033 Hypertensive retinopathy, bilateral: Secondary | ICD-10-CM

## 2023-09-09 DIAGNOSIS — H43813 Vitreous degeneration, bilateral: Secondary | ICD-10-CM

## 2023-09-09 DIAGNOSIS — E113312 Type 2 diabetes mellitus with moderate nonproliferative diabetic retinopathy with macular edema, left eye: Secondary | ICD-10-CM | POA: Diagnosis not present

## 2023-09-09 DIAGNOSIS — I10 Essential (primary) hypertension: Secondary | ICD-10-CM | POA: Diagnosis not present

## 2023-09-29 ENCOUNTER — Ambulatory Visit

## 2023-10-05 ENCOUNTER — Other Ambulatory Visit: Payer: Self-pay | Admitting: Internal Medicine

## 2023-10-05 DIAGNOSIS — I10 Essential (primary) hypertension: Secondary | ICD-10-CM

## 2023-10-05 NOTE — Telephone Encounter (Unsigned)
 Copied from CRM [id number]. Topic: Clinical - Medication Refill >> Oct 05, 2023  1:15 PM Sophia H wrote: Medication: cloNIDine  (CATAPRES ) 0.2 MG tablet   Has the patient contacted their pharmacy? Yes, pharmacy calling on behalf of patient .   This is the patient's preferred pharmacy:  [facility] [place], [place] - One [facility] AT Portal to Registered Caremark Sites [address] [place] [address] Phone: [phone] Fax: [phone]  Is this the correct pharmacy for this prescription? Yes If no, delete pharmacy and type the correct one.   Has the prescription been filled recently? Yes  Is the patient out of the medication? No, due for refill   Has the patient been seen for an appointment in the last year OR does the patient have an upcoming appointment? Yes, seen in April 2025.   Can we respond through MyChart? Yes  Agent: Please be advised that Rx refills may take up to 3 business days. We ask that you follow-up with your pharmacy.

## 2023-10-07 ENCOUNTER — Encounter (INDEPENDENT_AMBULATORY_CARE_PROVIDER_SITE_OTHER): Admitting: Ophthalmology

## 2023-10-07 MED ORDER — CLONIDINE HCL 0.2 MG PO TABS: 0.2000 mg | ORAL_TABLET | Freq: Three times a day (TID) | ORAL | 0 refills | Status: DC

## 2023-10-07 NOTE — Telephone Encounter (Signed)
 Requested Prescriptions  Pending Prescriptions Disp Refills   cloNIDine  (CATAPRES ) 0.2 MG tablet 270 tablet 0    Sig: Take 1 tablet (0.2 mg total) by mouth 3 (three) times daily.     Cardiovascular:  Alpha-2 Agonists Passed - 10/07/2023  2:20 PM      Passed - Last BP in normal range    BP Readings from Last 1 Encounters:  07/09/23 138/78         Passed - Last Heart Rate in normal range    Pulse Readings from Last 1 Encounters:  07/09/23 62         Passed - Valid encounter within last 6 months    Recent Outpatient Visits           3 months ago History of stroke with residual deficit   [facility] Health Primary Care & Sports Medicine at [facility], Leita DEL, MD   4 months ago History of stroke with residual deficit   [facility] Health Primary Care & Sports Medicine at [facility], Leita DEL, MD

## 2023-10-11 ENCOUNTER — Encounter (INDEPENDENT_AMBULATORY_CARE_PROVIDER_SITE_OTHER): Admitting: Ophthalmology

## 2023-10-11 DIAGNOSIS — Z7984 Long term (current) use of oral hypoglycemic drugs: Secondary | ICD-10-CM | POA: Diagnosis not present

## 2023-10-11 DIAGNOSIS — I10 Essential (primary) hypertension: Secondary | ICD-10-CM

## 2023-10-11 DIAGNOSIS — E113312 Type 2 diabetes mellitus with moderate nonproliferative diabetic retinopathy with macular edema, left eye: Secondary | ICD-10-CM | POA: Diagnosis not present

## 2023-10-11 DIAGNOSIS — E113391 Type 2 diabetes mellitus with moderate nonproliferative diabetic retinopathy without macular edema, right eye: Secondary | ICD-10-CM | POA: Diagnosis not present

## 2023-10-11 DIAGNOSIS — H43813 Vitreous degeneration, bilateral: Secondary | ICD-10-CM

## 2023-10-11 DIAGNOSIS — H35033 Hypertensive retinopathy, bilateral: Secondary | ICD-10-CM

## 2023-10-29 ENCOUNTER — Other Ambulatory Visit: Payer: Self-pay | Admitting: Internal Medicine

## 2023-10-29 DIAGNOSIS — E118 Type 2 diabetes mellitus with unspecified complications: Secondary | ICD-10-CM

## 2023-10-29 MED ORDER — LANCETS MISC: 1.0000 | Freq: Every day | 1 refills | Status: DC

## 2023-10-29 MED ORDER — ONETOUCH VERIO VI STRP: ORAL_STRIP | 3 refills | Status: DC

## 2023-10-29 NOTE — Telephone Encounter (Signed)
 Copied from CRM #[id number]. Topic: Clinical - Medication Refill >> Oct 29, 2023 10:19 AM Tiffini S wrote: Medication: Lancets MISC,  ONETOUCH VERIO test strip   Has the patient contacted their pharmacy? Yes, automatic refill (Agent: If no, request that the patient contact the pharmacy for the refill. If patient does not wish to contact the pharmacy document the reason why and proceed with request.) (Agent: If yes, when and what did the pharmacy advise?)  This is the patient's preferred pharmacy:  [facility] - [place], [place] - One [facility] AT Portal to Registered Caremark Sites One [place] [address] Phone: [phone] Fax: [phone]   Is this the correct pharmacy for this prescription? Yes If no, delete pharmacy and type the correct one.   Has the prescription been filled recently? Yes  Is the patient out of the medication? No, still have some, do not want to run out completely  Has the patient been seen for an appointment in the last year OR does the patient have an upcoming appointment? Yes  Can we respond through MyChart? Yes or call the patient at [phone]  Agent: Please be advised that Rx refills may take up to 3 business days. We ask that you follow-up with your pharmacy.

## 2023-10-29 NOTE — Telephone Encounter (Signed)
 Requested medication (s) are due for refill today: yes  Requested medication (s) are on the active medication list: yes  Last refill: test strips: 11/05/20 #100 3 RF             lancets: 01/01/24 #100 each 3 RF  Future visit scheduled: no  Notes to clinic:  70 -86 year old rx- does this need to be ordered again with current date?    Requested Prescriptions  Pending Prescriptions Disp Refills   Lancets MISC 100 each 3    Sig: 1 each by Does not apply route daily at 6 (six) AM.     Endocrinology: Diabetes - Testing Supplies Passed - 10/29/2023  3:39 PM      Passed - Valid encounter within last 12 months    Recent Outpatient Visits           3 months ago History of stroke with residual deficit   [facility] Health Primary Care & Sports Medicine at [facility], Leita DEL, MD   5 months ago History of stroke with residual deficit   [facility] Health Primary Care & Sports Medicine at [facility], Leita DEL, MD               [facility] VERIO test strip 100 each 3    Sig: Use up to 4 times daily as directed E10.9, E11.9     Endocrinology: Diabetes - Testing Supplies Passed - 10/29/2023  3:39 PM      Passed - Valid encounter within last 12 months    Recent Outpatient Visits           3 months ago History of stroke with residual deficit   [facility] Health Primary Care & Sports Medicine at [facility], Leita DEL, MD   5 months ago History of stroke with residual deficit   [facility] Health Primary Care & Sports Medicine at [facility], Leita DEL, MD

## 2023-10-30 ENCOUNTER — Other Ambulatory Visit: Payer: Self-pay | Admitting: Internal Medicine

## 2023-10-30 DIAGNOSIS — E118 Type 2 diabetes mellitus with unspecified complications: Secondary | ICD-10-CM

## 2023-11-01 NOTE — Telephone Encounter (Signed)
 Requested medications are due for refill today.  See pharmacy note  Requested medications are on the active medications list.  yes  Last refill. 10/29/2023  Future visit scheduled.   no  Notes to clinic.  Pharmacy comment: The frequency written for the lancets is conflicting with the frequency for the test strips dated 10/29/2023. Please respond with clear directions for both diabetic supplies or comment to Pharmacy. The frequency written for the lancets is conflicting with the frequency for the test strips dated 10/29/2023. Please respond with clear directions for both diabetic supplies or comment to Pharmacy.    Requested Prescriptions  Pending Prescriptions Disp Refills   Lancets MISC [Pharmacy Med Name: LANCETS MISC] 100 each 1    Sig: 1 each by Does not apply route daily at 6 (six) AM.     Endocrinology: Diabetes - Testing Supplies Passed - 11/01/2023  5:13 PM      Passed - Valid encounter within last 12 months    Recent Outpatient Visits           3 months ago History of stroke with residual deficit   [facility] Health Primary Care & Sports Medicine at [facility], Leita DEL, MD   5 months ago History of stroke with residual deficit   [facility] Health Primary Care & Sports Medicine at [facility], Leita DEL, MD               [facility] VERIO test strip [place] Med Name: ONETOUCH VERIO TEST STRIP] 100 each 3    Sig: Use up to 4 times daily as directed E10.9, E11.9     Endocrinology: Diabetes - Testing Supplies Passed - 11/01/2023  5:13 PM      Passed - Valid encounter within last 12 months    Recent Outpatient Visits           3 months ago History of stroke with residual deficit   [facility] Health Primary Care & Sports Medicine at [facility], Leita DEL, MD   5 months ago History of stroke with residual deficit   [facility] Primary Care & Sports Medicine at [facility], Leita DEL, MD

## 2023-11-08 ENCOUNTER — Encounter (INDEPENDENT_AMBULATORY_CARE_PROVIDER_SITE_OTHER): Admitting: Ophthalmology

## 2023-11-08 DIAGNOSIS — E113312 Type 2 diabetes mellitus with moderate nonproliferative diabetic retinopathy with macular edema, left eye: Secondary | ICD-10-CM | POA: Diagnosis not present

## 2023-11-08 DIAGNOSIS — Z7984 Long term (current) use of oral hypoglycemic drugs: Secondary | ICD-10-CM

## 2023-11-08 DIAGNOSIS — I10 Essential (primary) hypertension: Secondary | ICD-10-CM | POA: Diagnosis not present

## 2023-11-08 DIAGNOSIS — H35033 Hypertensive retinopathy, bilateral: Secondary | ICD-10-CM

## 2023-11-08 DIAGNOSIS — E113391 Type 2 diabetes mellitus with moderate nonproliferative diabetic retinopathy without macular edema, right eye: Secondary | ICD-10-CM

## 2023-11-08 DIAGNOSIS — H43813 Vitreous degeneration, bilateral: Secondary | ICD-10-CM

## 2023-12-01 ENCOUNTER — Other Ambulatory Visit: Payer: Self-pay | Admitting: Internal Medicine

## 2023-12-01 DIAGNOSIS — E118 Type 2 diabetes mellitus with unspecified complications: Secondary | ICD-10-CM

## 2023-12-01 NOTE — Telephone Encounter (Signed)
 Requested by interface surescripts. Last labs 05/18/23. Needs appt for further refills. Requested Prescriptions  Pending Prescriptions Disp Refills   metFORMIN  (GLUCOPHAGE ) 500 MG tablet [Pharmacy Med Name: METFORMIN  TAB 500MG ] 180 tablet 0    Sig: TAKE 1 TABLET TWICE DAILY  WITH MEALS **ZYDUS MFR**     Endocrinology:  Diabetes - Biguanides Failed - 12/01/2023  2:57 PM      Failed - Cr in normal range and within 360 days    Creatinine, Ser  Date Value Ref Range Status  05/18/2023 1.13 (H) 0.57 - 1.00 mg/dL Final         Failed - HBA1C is between 0 and 7.9 and within 180 days    Hemoglobin A1C  Date Value Ref Range Status  03/10/2016 7.0  Final   Hgb A1c MFr Bld  Date Value Ref Range Status  05/18/2023 6.1 (H) 4.8 - 5.6 % Final    Comment:             Prediabetes: 5.7 - 6.4          Diabetes: >6.4          Glycemic control for adults with diabetes: <7.0          Failed - eGFR in normal range and within 360 days    GFR calc Af Amer  Date Value Ref Range Status  12/27/2019 62 >59 mL/min/1.73 Final    Comment:    **[facility] currently reports eGFR in compliance with the current**   recommendations of the [employer]. [facility] will   update reporting as new guidelines are published from the NKF-ASN   Task force.    GFR, Estimated  Date Value Ref Range Status  05/09/2023 45 (L) >60 mL/min Final    Comment:    (NOTE) Calculated using the CKD-EPI Creatinine Equation (2021)    eGFR  Date Value Ref Range Status  05/18/2023 48 (L) >59 mL/min/1.73 Final         Failed - B12 Level in normal range and within 720 days    Vitamin B-12  Date Value Ref Range Status  05/09/2023 3,369 (H) 180 - 914 pg/mL Final    Comment:    (NOTE) This assay is not validated for testing neonatal or myeloproliferative syndrome specimens for Vitamin B12 levels. Performed at [facility], [address] [place], [place] [address]          Passed - Valid encounter within  last 6 months    Recent Outpatient Visits           4 months ago History of stroke with residual deficit   [facility] Health Primary Care & Sports Medicine at [facility], Leita DEL, MD   6 months ago History of stroke with residual deficit   [facility] Health Primary Care & Sports Medicine at [facility], Leita DEL, MD              Passed - CBC within normal limits and completed in the last 12 months    WBC  Date Value Ref Range Status  05/18/2023 4.5 3.4 - 10.8 x10E3/uL Final  05/09/2023 6.6 4.0 - 10.5 K/uL Final   RBC  Date Value Ref Range Status  05/18/2023 3.51 (L) 3.77 - 5.28 x10E6/uL Final  05/09/2023 3.79 (L) 3.87 - 5.11 MIL/uL Final   Hemoglobin  Date Value Ref Range Status  05/18/2023 10.6 (L) 11.1 - 15.9 g/dL Final   Hematocrit  Date Value Ref Range  Status  05/18/2023 33.1 (L) 34.0 - 46.6 % Final   MCHC  Date Value Ref Range Status  05/18/2023 32.0 31.5 - 35.7 g/dL Final  97/90/7974 68.0 30.0 - 36.0 g/dL Final   [facility]  Date Value Ref Range Status  05/18/2023 30.2 26.6 - 33.0 pg Final  05/09/2023 30.1 26.0 - 34.0 pg Final   MCV  Date Value Ref Range Status  05/18/2023 94 79 - 97 fL Final   No results found for: PLTCOUNTKUC, LABPLAT, POCPLA RDW  Date Value Ref Range Status  05/18/2023 13.9 11.7 - 15.4 % Final

## 2023-12-08 ENCOUNTER — Encounter (INDEPENDENT_AMBULATORY_CARE_PROVIDER_SITE_OTHER): Admitting: Ophthalmology

## 2023-12-08 DIAGNOSIS — I10 Essential (primary) hypertension: Secondary | ICD-10-CM

## 2023-12-08 DIAGNOSIS — Z7984 Long term (current) use of oral hypoglycemic drugs: Secondary | ICD-10-CM

## 2023-12-08 DIAGNOSIS — E113391 Type 2 diabetes mellitus with moderate nonproliferative diabetic retinopathy without macular edema, right eye: Secondary | ICD-10-CM

## 2023-12-08 DIAGNOSIS — H43813 Vitreous degeneration, bilateral: Secondary | ICD-10-CM

## 2023-12-08 DIAGNOSIS — E113312 Type 2 diabetes mellitus with moderate nonproliferative diabetic retinopathy with macular edema, left eye: Secondary | ICD-10-CM

## 2023-12-08 DIAGNOSIS — H35033 Hypertensive retinopathy, bilateral: Secondary | ICD-10-CM

## 2023-12-18 ENCOUNTER — Other Ambulatory Visit: Payer: Self-pay | Admitting: Internal Medicine

## 2023-12-18 DIAGNOSIS — I10 Essential (primary) hypertension: Secondary | ICD-10-CM

## 2023-12-20 NOTE — Telephone Encounter (Signed)
 Requested medication (s) are due for refill today:   Yes for all 3  Requested medication (s) are on the active medication list:   Yes for all 3  Future visit scheduled:   No.    LOV 07/09/2023     Last ordered: Metoprolol , Micardis  and Cardura  06/18/2023 #90, 1 refill each.     Unable to refill because labs are due.    Requested Prescriptions  Pending Prescriptions Disp Refills   metoprolol  succinate (TOPROL -XL) 100 MG 24 hr tablet [Pharmacy Med Name: METOPROL SUC TAB 100MG  ER] 90 tablet 1    Sig: TAKE 1 TABLET DAILY WITH ORIMMEDIATELY FOLLOWING A    MEAL     Cardiovascular:  Beta Blockers Passed - 12/20/2023 11:47 AM      Passed - Last BP in normal range    BP Readings from Last 1 Encounters:  07/09/23 138/78         Passed - Last Heart Rate in normal range    Pulse Readings from Last 1 Encounters:  07/09/23 62         Passed - Valid encounter within last 6 months    Recent Outpatient Visits           5 months ago History of stroke with residual deficit   [facility] Health Primary Care & Sports Medicine at [facility], Leita DEL, MD   7 months ago History of stroke with residual deficit   [facility] Health Primary Care & Sports Medicine at [facility], Leita DEL, MD               telmisartan  (MICARDIS ) 80 MG tablet [Pharmacy Med Name: TELMISARTAN   TAB 80MG ] 90 tablet 0    Sig: TAKE 1 TABLET DAILY.     Cardiovascular:  Angiotensin Receptor Blockers Failed - 12/20/2023 11:47 AM      Failed - Cr in normal range and within 180 days    Creatinine, Ser  Date Value Ref Range Status  05/18/2023 1.13 (H) 0.57 - 1.00 mg/dL Final         Failed - K in normal range and within 180 days    Potassium  Date Value Ref Range Status  05/18/2023 4.8 3.5 - 5.2 mmol/L Final         Passed - Patient is not pregnant      Passed - Last BP in normal range    BP Readings from Last 1 Encounters:  07/09/23 138/78         Passed - Valid encounter within last 6 months     Recent Outpatient Visits           5 months ago History of stroke with residual deficit   [facility] Health Primary Care & Sports Medicine at [facility], Leita DEL, MD   7 months ago History of stroke with residual deficit   [facility] Health Primary Care & Sports Medicine at [facility], Leita DEL, MD               doxazosin  (CARDURA ) 4 MG tablet [Pharmacy Med Name: DOXAZOSIN  TAB 4MG ] 90 tablet 1    Sig: TAKE 1 TABLET DAILY     Cardiovascular:  Alpha Blockers Passed - 12/20/2023 11:47 AM      Passed - Last BP in normal range    BP Readings from Last 1 Encounters:  07/09/23 138/78         Passed - Valid encounter within last 6 months  Recent Outpatient Visits           5 months ago History of stroke with residual deficit   [facility] Health Primary Care & Sports Medicine at [facility], Leita DEL, MD   7 months ago History of stroke with residual deficit   [facility] Health Primary Care & Sports Medicine at [facility], Leita DEL, MD

## 2024-01-04 ENCOUNTER — Telehealth: Payer: Self-pay

## 2024-01-04 NOTE — Telephone Encounter (Signed)
 Copied from CRM [id number]. Topic: General - Other >> Jan 04, 2024 12:50 PM Sophia H wrote: Reason for CRM: Patient states she is still seeing the retina specialist for her eye, has an appointment with him this Thursday & she will follow up with PCP as soon as she is done with the specialist. Just FYI

## 2024-01-06 ENCOUNTER — Encounter (INDEPENDENT_AMBULATORY_CARE_PROVIDER_SITE_OTHER): Admitting: Ophthalmology

## 2024-01-06 DIAGNOSIS — E113312 Type 2 diabetes mellitus with moderate nonproliferative diabetic retinopathy with macular edema, left eye: Secondary | ICD-10-CM | POA: Diagnosis not present

## 2024-01-06 DIAGNOSIS — E113391 Type 2 diabetes mellitus with moderate nonproliferative diabetic retinopathy without macular edema, right eye: Secondary | ICD-10-CM | POA: Diagnosis not present

## 2024-01-06 DIAGNOSIS — I10 Essential (primary) hypertension: Secondary | ICD-10-CM

## 2024-01-06 DIAGNOSIS — H43813 Vitreous degeneration, bilateral: Secondary | ICD-10-CM

## 2024-01-06 DIAGNOSIS — Z7984 Long term (current) use of oral hypoglycemic drugs: Secondary | ICD-10-CM | POA: Diagnosis not present

## 2024-01-06 DIAGNOSIS — H35033 Hypertensive retinopathy, bilateral: Secondary | ICD-10-CM

## 2024-01-17 ENCOUNTER — Other Ambulatory Visit: Payer: Self-pay | Admitting: Internal Medicine

## 2024-01-17 DIAGNOSIS — I10 Essential (primary) hypertension: Secondary | ICD-10-CM

## 2024-01-18 NOTE — Telephone Encounter (Signed)
 Requested medications are due for refill today.  yes  Requested medications are on the active medications list.  yes  Last refill. 12/20/2023 #30 0 rf  Future visit scheduled.   no  Notes to clinic.  Pt has already been given courtesy refills. Pt is over due for an OV.  Pt has missed a few scheduled appts.    Requested Prescriptions  Pending Prescriptions Disp Refills   metoprolol  succinate (TOPROL -XL) 100 MG 24 hr tablet [Pharmacy Med Name: METOPROL SUC TAB 100MG  ER] 30 tablet 0    Sig: TAKE 1 TABLET DAILY WITH ORIMMEDIATELY FOLLOWING A    MEAL     Cardiovascular:  Beta Blockers Failed - 01/18/2024  3:22 PM      Failed - Valid encounter within last 6 months    Recent Outpatient Visits           6 months ago History of stroke with residual deficit   [facility] Health Primary Care & Sports Medicine at [facility], Leita DEL, MD   8 months ago History of stroke with residual deficit   [facility] Health Primary Care & Sports Medicine at [facility], Leita DEL, MD              Passed - Last BP in normal range    BP Readings from Last 1 Encounters:  07/09/23 138/78         Passed - Last Heart Rate in normal range    Pulse Readings from Last 1 Encounters:  07/09/23 62          telmisartan  (MICARDIS ) 80 MG tablet [Pharmacy Med Name: TELMISARTAN   TAB 80MG ] 30 tablet 0    Sig: TAKE 1 TABLET DAILY.     Cardiovascular:  Angiotensin Receptor Blockers Failed - 01/18/2024  3:22 PM      Failed - Cr in normal range and within 180 days    Creatinine, Ser  Date Value Ref Range Status  05/18/2023 1.13 (H) 0.57 - 1.00 mg/dL Final         Failed - K in normal range and within 180 days    Potassium  Date Value Ref Range Status  05/18/2023 4.8 3.5 - 5.2 mmol/L Final         Failed - Valid encounter within last 6 months    Recent Outpatient Visits           6 months ago History of stroke with residual deficit   [facility] Health Primary Care & Sports Medicine at  [facility], Leita DEL, MD   8 months ago History of stroke with residual deficit   [facility] Health Primary Care & Sports Medicine at [facility], Leita DEL, MD              Passed - Patient is not pregnant      Passed - Last BP in normal range    BP Readings from Last 1 Encounters:  07/09/23 138/78          doxazosin  (CARDURA ) 4 MG tablet [Pharmacy Med Name: DOXAZOSIN  TAB 4MG ] 30 tablet 0    Sig: TAKE 1 TABLET DAILY     Cardiovascular:  Alpha Blockers Failed - 01/18/2024  3:22 PM      Failed - Valid encounter within last 6 months    Recent Outpatient Visits           6 months ago History of stroke with residual deficit   [facility] Health Primary Care & Sports  Medicine at [facility], Leita DEL, MD   8 months ago History of stroke with residual deficit   [facility] Primary Care & Sports Medicine at [facility], Leita DEL, MD              Passed - Last BP in normal range    BP Readings from Last 1 Encounters:  07/09/23 138/78

## 2024-02-07 ENCOUNTER — Encounter: Payer: Self-pay | Admitting: Internal Medicine

## 2024-02-07 ENCOUNTER — Ambulatory Visit (INDEPENDENT_AMBULATORY_CARE_PROVIDER_SITE_OTHER): Admitting: Internal Medicine

## 2024-02-07 VITALS — BP 128/72 | HR 55 | Ht 64.0 in | Wt 140.0 lb

## 2024-02-07 DIAGNOSIS — I10 Essential (primary) hypertension: Secondary | ICD-10-CM | POA: Diagnosis not present

## 2024-02-07 DIAGNOSIS — N1831 Chronic kidney disease, stage 3a: Secondary | ICD-10-CM | POA: Insufficient documentation

## 2024-02-07 DIAGNOSIS — E118 Type 2 diabetes mellitus with unspecified complications: Secondary | ICD-10-CM

## 2024-02-07 DIAGNOSIS — Z7984 Long term (current) use of oral hypoglycemic drugs: Secondary | ICD-10-CM | POA: Diagnosis not present

## 2024-02-07 MED ORDER — CLONIDINE HCL 0.2 MG PO TABS: 0.2000 mg | ORAL_TABLET | Freq: Three times a day (TID) | ORAL | 1 refills | Status: AC

## 2024-02-07 MED ORDER — ONETOUCH VERIO FLEX SYSTEM DEVI: 1.0000 | Freq: Every day | 0 refills | Status: AC

## 2024-02-07 MED ORDER — TELMISARTAN 80 MG PO TABS: 80.0000 mg | ORAL_TABLET | Freq: Every day | ORAL | 1 refills | Status: AC

## 2024-02-07 MED ORDER — METOPROLOL SUCCINATE ER 100 MG PO TB24: ORAL_TABLET | ORAL | 1 refills | Status: AC

## 2024-02-07 MED ORDER — DOXAZOSIN MESYLATE 4 MG PO TABS: 4.0000 mg | ORAL_TABLET | Freq: Every day | ORAL | 1 refills | Status: AC

## 2024-02-07 NOTE — Progress Notes (Signed)
 Date:  02/07/2024   Name:  Amber Padilla   DOB:  Jul 03, 1935   MRN:  [id number]   Chief Complaint: Hypertension, Diabetes (Foot exam. Requested Eye Exam from [facility] in [place], [place].), and Nocturia (Uses the bathroom multiple times at night. )  Hypertension This is a chronic problem. The problem is controlled. Pertinent negatives include no chest pain, headaches, palpitations or shortness of breath. Past treatments include angiotensin blockers, beta blockers and central alpha agonists.  Diabetes She presents for her follow-up diabetic visit. She has type 2 diabetes mellitus. Her disease course has been stable. Pertinent negatives for hypoglycemia include no dizziness, headaches or nervousness/anxiousness. Pertinent negatives for diabetes include no chest pain, no fatigue and no weakness. Current diabetic treatment includes oral agent (monotherapy) (MTF).    Review of Systems  Constitutional:  Negative for fatigue and unexpected weight change.  HENT:  Negative for trouble swallowing.   Eyes:  Negative for visual disturbance.  Respiratory:  Negative for cough, chest tightness, shortness of breath and wheezing.   Cardiovascular:  Negative for chest pain, palpitations and leg swelling.  Gastrointestinal:  Negative for abdominal pain, constipation and diarrhea.  Genitourinary:  Positive for frequency. Negative for dysuria and urgency.  Musculoskeletal:  Negative for arthralgias and myalgias.  Neurological:  Negative for dizziness, weakness, light-headedness and headaches.  Psychiatric/Behavioral:  Negative for dysphoric mood and sleep disturbance. The patient is not nervous/anxious.      Lab Results  Component Value Date   NA 142 05/18/2023   K 4.8 05/18/2023   CO2 27 05/18/2023   GLUCOSE 57 (L) 05/18/2023   BUN 14 05/18/2023   CREATININE 1.13 (H) 05/18/2023   CALCIUM  9.5 05/18/2023   EGFR 48 (L) 05/18/2023   GFRNONAA 45 (L) 05/09/2023   Lab Results  Component Value Date    CHOL 156 05/18/2023   HDL 58 05/18/2023   LDLCALC 85 05/18/2023   TRIG 65 05/18/2023   CHOLHDL 2.7 05/18/2023   Lab Results  Component Value Date   TSH 1.316 05/09/2023   Lab Results  Component Value Date   HGBA1C 6.1 (H) 05/18/2023   Lab Results  Component Value Date   WBC 4.5 05/18/2023   HGB 10.6 (L) 05/18/2023   HCT 33.1 (L) 05/18/2023   MCV 94 05/18/2023   PLT 174 05/18/2023   Lab Results  Component Value Date   ALT 9 05/18/2023   AST 16 05/18/2023   ALKPHOS 72 05/18/2023   BILITOT 0.5 05/18/2023   No results found for: MARIEN BOLLS, VD25OH   Patient Active Problem List   Diagnosis Date Noted   CKD stage 3a, GFR 45-59 ml/min (HCC) 02/07/2024   Dysphagia status post cerebrovascular accident 05/25/2023   Aphasia following cerebral infarction 05/25/2023   Vertigo following cerebrovascular accident 05/25/2023   Head pain cephalgia 05/25/2023   Urine incontinence 05/25/2023   Personal history of urinary calculi 05/25/2023   History of stroke with residual deficit 05/18/2023   TIA (transient ischemic attack) 05/09/2023   Proteinuria due to type 2 diabetes mellitus (HCC) 12/31/2021   Osteopenia determined by x-ray 07/04/2021   Horseshoe kidney with renal calculus 07/25/2020   Aortic atherosclerosis 07/25/2020   Type II diabetes mellitus with complication (HCC) 12/28/2018   Essential hypertension 12/28/2018   Hyperlipidemia associated with type 2 diabetes mellitus (HCC) 12/28/2018    Allergies  Allergen Reactions   Amlodipine Swelling    Other Reaction(s): Edema   Propoxyphene Rash    Past Surgical History:  Procedure Laterality Date   BUNIONECTOMY Bilateral 2005    Social History   Tobacco Use   Smoking status: Never   Smokeless tobacco: Never  Vaping Use   Vaping status: Never Used  Substance Use Topics   Alcohol use: Never   Drug use: Never     Medication list has been reviewed and updated.  Current Meds  Medication Sig    acetaminophen  (TYLENOL ) 500 MG tablet Take 1,000 mg by mouth every 6 (six) hours as needed.   atorvastatin  (LIPITOR) 80 MG tablet Take 1 tablet (80 mg total) by mouth daily. (Patient taking differently: Take 40 mg by mouth daily.)   blood glucose meter kit and supplies Dispense based on patient and insurance preference. Use up to four times daily as directed. (FOR ICD-10 E10.9, E11.9).   Blood Glucose Monitoring Suppl (ONETOUCH VERIO FLEX SYSTEM) DEVI 1 each by Does not apply route daily at 6 (six) AM.   Calcium  Carb-Cholecalciferol  500-3.125 MG-MCG TABS Take 1 tablet by mouth daily.   CALCIUM  PO Take 600 mg by mouth.   calcium -vitamin D  (OSCAL WITH D) 500-200 MG-UNIT tablet Take 1 tablet by mouth.   Cholecalciferol  (VITAMIN D3 PO) Take 1,000 Units by mouth.   ferrous sulfate  324 MG TBEC Take 324 mg by mouth daily.   Lancets MISC Use up to 4 times daily as directed E10.9, E11.9   Magnesium  500 MG CAPS Take by mouth.   metFORMIN  (GLUCOPHAGE ) 500 MG tablet TAKE 1 TABLET TWICE DAILY  WITH MEALS **ZYDUS MFR**   ONETOUCH VERIO test strip USE UP TO 4 TIMES DAILY AS DIRECTED E10.9, E11.9   vitamin B-12 (CYANOCOBALAMIN ) 500 MCG tablet Take 500 mcg by mouth daily.   [DISCONTINUED] cloNIDine  (CATAPRES ) 0.2 MG tablet Take 1 tablet (0.2 mg total) by mouth 3 (three) times daily.   [DISCONTINUED] clopidogrel  (PLAVIX ) 75 MG tablet Take 1 tablet (75 mg total) by mouth daily.   [DISCONTINUED] doxazosin  (CARDURA ) 4 MG tablet TAKE 1 TABLET DAILY   [DISCONTINUED] metoprolol  succinate (TOPROL -XL) 100 MG 24 hr tablet TAKE 1 TABLET DAILY WITH ORIMMEDIATELY FOLLOWING A    MEAL   [DISCONTINUED] telmisartan  (MICARDIS ) 80 MG tablet TAKE 1 TABLET DAILY.       02/07/2024   10:45 AM 07/09/2023    1:29 PM 05/18/2023    2:15 PM 01/20/2023   10:26 AM  GAD 7 : Generalized Anxiety Score  Nervous, Anxious, on Edge 0 0 0 0  Control/stop worrying 0 0 0 0  Worry too much - different things 0 0 0 0  Trouble relaxing 0 0 0 0   Restless 0 0 0 0  Easily annoyed or irritable 0 0 0 0  Afraid - awful might happen 0 0 0 0  Total GAD 7 Score 0 0 0 0  Anxiety Difficulty Not difficult at all Not difficult at all Not difficult at all Not difficult at all       02/07/2024   10:45 AM 07/09/2023    1:29 PM 05/18/2023    2:15 PM  Depression screen PHQ 2/9  Decreased Interest 0 0 0  Down, Depressed, Hopeless 0 0 0  PHQ - 2 Score 0 0 0  Altered sleeping 1 1 2   Tired, decreased energy 1 1 0  Change in appetite 0 0 0  Feeling bad or failure about yourself  0 0 0  Trouble concentrating 0 0 0  Moving slowly or fidgety/restless 0 0 0  Suicidal thoughts 0 0 0  PHQ-9 Score 2 2  2    Difficult doing work/chores Not difficult at all Not difficult at all Not difficult at all     Data saved with a previous flowsheet row definition    BP Readings from Last 3 Encounters:  02/07/24 128/72  07/09/23 138/78  05/18/23 128/78    Physical Exam Vitals and nursing note reviewed.  Constitutional:      General: She is not in acute distress.    Appearance: She is well-developed.  HENT:     Head: Normocephalic and atraumatic.  Neck:     Vascular: No carotid bruit.  Cardiovascular:     Rate and Rhythm: Normal rate and regular rhythm.     Heart sounds: No murmur heard. Pulmonary:     Effort: Pulmonary effort is normal. No respiratory distress.     Breath sounds: No wheezing or rhonchi.  Musculoskeletal:     Right lower leg: No edema.     Left lower leg: No edema.  Lymphadenopathy:     Cervical: No cervical adenopathy.  Skin:    General: Skin is warm and dry.     Capillary Refill: Capillary refill takes less than 2 seconds.     Findings: No rash.  Neurological:     General: No focal deficit present.     Mental Status: She is alert and oriented to person, place, and time.  Psychiatric:        Attention and Perception: Attention normal.        Mood and Affect: Mood normal.        Behavior: Behavior normal.         Thought Content: Thought content normal.        Cognition and Memory: Cognition normal.        Judgment: Judgment normal.     Wt Readings from Last 3 Encounters:  02/07/24 140 lb (63.5 kg)  07/09/23 140 lb 8 oz (63.7 kg)  05/18/23 142 lb (64.4 kg)    BP 128/72   Pulse (!) 55   Ht 5' 4 (1.626 m)   Wt 140 lb (63.5 kg)   SpO2 97%   BMI 24.03 kg/m   Assessment and Plan:  Problem List Items Addressed This Visit       Unprioritized   Type II diabetes mellitus with complication (HCC)   Currently medications are MTF.  No hypoglycemic episodes noted. Home blood sugars in the 120 range.  She requests a new meter. Last visit medical regimen changes were none. Lab Results  Component Value Date   HGBA1C 6.1 (H) 05/18/2023          Relevant Medications   Blood Glucose Monitoring Suppl (ONETOUCH VERIO FLEX SYSTEM) DEVI   telmisartan  (MICARDIS ) 80 MG tablet   Other Relevant Orders   Comprehensive metabolic panel with GFR   Hemoglobin A1c   Essential hypertension - Primary (Chronic)   Well controlled blood pressure today. Current regimen is metoprolol , telmisartan , clonidine  and Cardura . No medication side effects noted.        Relevant Medications   cloNIDine  (CATAPRES ) 0.2 MG tablet   doxazosin  (CARDURA ) 4 MG tablet   metoprolol  succinate (TOPROL -XL) 100 MG 24 hr tablet   telmisartan  (MICARDIS ) 80 MG tablet   Other Relevant Orders   CBC with Differential/Platelet   TSH   CKD stage 3a, GFR 45-59 ml/min (HCC)   Will continue to monitor regularly. She hydrates well daily with water. No regular use of NSAIDS  Relevant Orders   Comprehensive metabolic panel with GFR   Other Visit Diagnoses       Long term current use of oral hypoglycemic drug           Return in about 4 months (around 06/06/2024) for DM, HTN  Dr. Lemon.    Leita HILARIO Adie, MD [facility] [place]

## 2024-02-07 NOTE — Assessment & Plan Note (Signed)
 Will continue to monitor regularly. She hydrates well daily with water. No regular use of NSAIDS

## 2024-02-07 NOTE — Assessment & Plan Note (Addendum)
 Currently medications are MTF.  No hypoglycemic episodes noted. Home blood sugars in the 120 range.  She requests a new meter. Last visit medical regimen changes were none. Lab Results  Component Value Date   HGBA1C 6.1 (H) 05/18/2023

## 2024-02-07 NOTE — Assessment & Plan Note (Signed)
 Well controlled blood pressure today. Current regimen is metoprolol , telmisartan , clonidine  and Cardura . No medication side effects noted.

## 2024-02-08 ENCOUNTER — Ambulatory Visit: Payer: Self-pay | Admitting: Internal Medicine

## 2024-02-08 LAB — CBC WITH DIFFERENTIAL/PLATELET
Basophils Absolute: 0 x10E3/uL (ref 0.0–0.2)
Basos: 1 %
EOS (ABSOLUTE): 0.2 x10E3/uL (ref 0.0–0.4)
Eos: 5 %
Hematocrit: 35.7 % (ref 34.0–46.6)
Hemoglobin: 11.1 g/dL (ref 11.1–15.9)
Immature Grans (Abs): 0 x10E3/uL (ref 0.0–0.1)
Immature Granulocytes: 0 %
Lymphocytes Absolute: 1.4 x10E3/uL (ref 0.7–3.1)
Lymphs: 32 %
MCH: 29.7 pg (ref 26.6–33.0)
MCHC: 31.1 g/dL — ABNORMAL LOW (ref 31.5–35.7)
MCV: 96 fL (ref 79–97)
Monocytes Absolute: 0.5 x10E3/uL (ref 0.1–0.9)
Monocytes: 11 %
Neutrophils Absolute: 2.3 x10E3/uL (ref 1.4–7.0)
Neutrophils: 51 %
Platelets: 182 x10E3/uL (ref 150–450)
RBC: 3.74 x10E6/uL — ABNORMAL LOW (ref 3.77–5.28)
RDW: 13.8 % (ref 11.7–15.4)
WBC: 4.5 x10E3/uL (ref 3.4–10.8)

## 2024-02-08 LAB — COMPREHENSIVE METABOLIC PANEL WITH GFR
ALT: 5 IU/L (ref 0–32)
AST: 16 IU/L (ref 0–40)
Albumin: 4.2 g/dL (ref 3.7–4.7)
Alkaline Phosphatase: 90 IU/L (ref 48–129)
BUN/Creatinine Ratio: 17 (ref 12–28)
BUN: 18 mg/dL (ref 8–27)
Bilirubin Total: 0.7 mg/dL (ref 0.0–1.2)
CO2: 26 mmol/L (ref 20–29)
Calcium: 9.8 mg/dL (ref 8.7–10.3)
Chloride: 101 mmol/L (ref 96–106)
Creatinine, Ser: 1.09 mg/dL — ABNORMAL HIGH (ref 0.57–1.00)
Globulin, Total: 4.3 g/dL (ref 1.5–4.5)
Glucose: 107 mg/dL — ABNORMAL HIGH (ref 70–99)
Potassium: 5.3 mmol/L — ABNORMAL HIGH (ref 3.5–5.2)
Sodium: 140 mmol/L (ref 134–144)
Total Protein: 8.5 g/dL (ref 6.0–8.5)
eGFR: 49 mL/min/1.73 — ABNORMAL LOW (ref >59)

## 2024-02-08 LAB — TSH: TSH: 2.66 u[IU]/mL (ref 0.450–4.500)

## 2024-02-08 LAB — HEMOGLOBIN A1C
Est. average glucose Bld gHb Est-mCnc: 126 mg/dL
Hgb A1c MFr Bld: 6 % — ABNORMAL HIGH (ref 4.8–5.6)

## 2024-02-10 ENCOUNTER — Encounter (INDEPENDENT_AMBULATORY_CARE_PROVIDER_SITE_OTHER): Admitting: Ophthalmology

## 2024-02-10 DIAGNOSIS — H35033 Hypertensive retinopathy, bilateral: Secondary | ICD-10-CM

## 2024-02-10 DIAGNOSIS — I10 Essential (primary) hypertension: Secondary | ICD-10-CM

## 2024-02-10 DIAGNOSIS — E113391 Type 2 diabetes mellitus with moderate nonproliferative diabetic retinopathy without macular edema, right eye: Secondary | ICD-10-CM | POA: Diagnosis not present

## 2024-02-10 DIAGNOSIS — H43813 Vitreous degeneration, bilateral: Secondary | ICD-10-CM

## 2024-02-10 DIAGNOSIS — E113312 Type 2 diabetes mellitus with moderate nonproliferative diabetic retinopathy with macular edema, left eye: Secondary | ICD-10-CM | POA: Diagnosis not present

## 2024-02-10 DIAGNOSIS — Z7984 Long term (current) use of oral hypoglycemic drugs: Secondary | ICD-10-CM

## 2024-02-15 ENCOUNTER — Telehealth: Payer: Self-pay

## 2024-02-15 NOTE — Telephone Encounter (Signed)
 Copied from CRM [id number]. Topic: Clinical - Lab/Test Results >> Feb 15, 2024  9:45 AM Myrick T wrote: Reason for CRM: advised patient of her lab results but she is requesting a call back from the nurse with her cholesterol numbers

## 2024-02-15 NOTE — Telephone Encounter (Signed)
Duplicate   KP 

## 2024-02-15 NOTE — Telephone Encounter (Signed)
 Copied from CRM [id number]. Topic: Clinical - Lab/Test Results >> Feb 15, 2024 10:24 AM Shanda MATSU wrote: Reason for CRM: Patient called in regards to lab results from labs done on 02/07/24, adv patient of the following info: Diabetes is well controlled.  Renal function is stable.  All other labs are good. Patient has more questions in regards to cholesterol results, is req a call back.

## 2024-02-16 NOTE — Telephone Encounter (Signed)
 Tried calling pt. No answer. If pt returns call, pls let her know of Dr Christene response (E2C2).

## 2024-03-03 ENCOUNTER — Other Ambulatory Visit: Payer: Self-pay | Admitting: Internal Medicine

## 2024-03-03 DIAGNOSIS — E118 Type 2 diabetes mellitus with unspecified complications: Secondary | ICD-10-CM

## 2024-03-06 ENCOUNTER — Telehealth: Payer: Self-pay | Admitting: Internal Medicine

## 2024-03-06 ENCOUNTER — Encounter: Payer: Self-pay | Admitting: Internal Medicine

## 2024-03-06 NOTE — Telephone Encounter (Signed)
 Called and left pt VM asking why the letter is needed (trying to get a refund on plane tickets, etc)   Waiting for call back for more information for this.

## 2024-03-06 NOTE — Telephone Encounter (Signed)
 Patient called the office back. She said the letter is to get a refund on a plane ticket. She said she needed a letter on a letterhead from her PCP stating she is unable to fly due to history of stroke, and gait issues.  - Amber Padilla M.

## 2024-03-06 NOTE — Telephone Encounter (Signed)
 Copied from CRM [id number]. Topic: General - Other >> Mar 06, 2024 11:06 AM Amber Padilla wrote: Reason for CRM: Patient states , needs letter stating she can't fly because of fluid in back of  her eye and pressure in her ears , she can't deal  flying. Patient would like this mailed to her

## 2024-03-07 NOTE — Telephone Encounter (Signed)
 Requested Prescriptions  Pending Prescriptions Disp Refills   metFORMIN  (GLUCOPHAGE ) 500 MG tablet [Pharmacy Med Name: METFORMIN  TAB 500MG ] 180 tablet 1    Sig: TAKE 1 TABLET TWICE DAILY  WITH MEALS **ZYDUS MFR**     Endocrinology:  Diabetes - Biguanides Failed - 03/07/2024 11:14 AM      Failed - Cr in normal range and within 360 days    Creatinine, Ser  Date Value Ref Range Status  02/07/2024 1.09 (H) 0.57 - 1.00 mg/dL Final         Failed - eGFR in normal range and within 360 days    GFR calc Af Amer  Date Value Ref Range Status  12/27/2019 62 >59 mL/min/1.73 Final    Comment:    **[facility] currently reports eGFR in compliance with the current**   recommendations of the [employer]. [facility] will   update reporting as new guidelines are published from the NKF-ASN   Task force.    GFR, Estimated  Date Value Ref Range Status  05/09/2023 45 (L) >60 mL/min Final    Comment:    (NOTE) Calculated using the CKD-EPI Creatinine Equation (2021)    eGFR  Date Value Ref Range Status  02/07/2024 49 (L) >59 mL/min/1.73 Final         Failed - B12 Level in normal range and within 720 days    Vitamin B-12  Date Value Ref Range Status  05/09/2023 3,369 (H) 180 - 914 pg/mL Final    Comment:    (NOTE) This assay is not validated for testing neonatal or myeloproliferative syndrome specimens for Vitamin B12 levels. Performed at [facility], [address] [place], [place] [address]          Passed - HBA1C is between 0 and 7.9 and within 180 days    Hemoglobin A1C  Date Value Ref Range Status  03/10/2016 7.0  Final   Hgb A1c MFr Bld  Date Value Ref Range Status  02/07/2024 6.0 (H) 4.8 - 5.6 % Final    Comment:             Prediabetes: 5.7 - 6.4          Diabetes: >6.4          Glycemic control for adults with diabetes: <7.0          Passed - Valid encounter within last 6 months    Recent Outpatient Visits           4 weeks ago Essential hypertension    [place] [facility] at [facility], Leita DEL, MD   8 months ago History of stroke with residual deficit   [facility], Leita DEL, MD   9 months ago History of stroke with residual deficit   [facility] at [facility], Leita DEL, MD              Passed - CBC within normal limits and completed in the last 12 months    WBC  Date Value Ref Range Status  02/07/2024 4.5 3.4 - 10.8 x10E3/uL Final  05/09/2023 6.6 4.0 - 10.5 K/uL Final   RBC  Date Value Ref Range Status  02/07/2024 3.74 (L) 3.77 - 5.28 x10E6/uL Final  05/09/2023 3.79 (L) 3.87 - 5.11 MIL/uL Final   Hemoglobin  Date Value Ref Range Status  02/07/2024 11.1 11.1 - 15.9 g/dL  Final   Hematocrit  Date Value Ref Range Status  02/07/2024 35.7 34.0 - 46.6 % Final   MCHC  Date Value Ref Range Status  02/07/2024 31.1 (L) 31.5 - 35.7 g/dL Final  97/90/7974 68.0 30.0 - 36.0 g/dL Final   [facility]  Date Value Ref Range Status  02/07/2024 29.7 26.6 - 33.0 pg Final  05/09/2023 30.1 26.0 - 34.0 pg Final   MCV  Date Value Ref Range Status  02/07/2024 96 79 - 97 fL Final   No results found for: PLTCOUNTKUC, LABPLAT, POCPLA RDW  Date Value Ref Range Status  02/07/2024 13.8 11.7 - 15.4 % Final

## 2024-03-09 ENCOUNTER — Telehealth: Payer: Self-pay

## 2024-03-09 NOTE — Telephone Encounter (Signed)
 Please review.  KP

## 2024-03-09 NOTE — Telephone Encounter (Signed)
 Copied from CRM #[id number]. Topic: General - Other >> Mar 09, 2024  3:00 PM Amber Padilla wrote: Reason for CRM: patient called to let her provider/Chassidy know what info the airline is looking for in the letter that she needs for a refund of her ticket. Patient said since her stroke she is unable to fly. Please f/u with patient

## 2024-03-10 ENCOUNTER — Telehealth: Payer: Self-pay | Admitting: Internal Medicine

## 2024-03-10 NOTE — Telephone Encounter (Signed)
 Copied from CRM #[id number]. Topic: General - Call Back - No Documentation >> Mar 10, 2024  8:30 AM Rea C wrote: Reason for CRM: Patient would like the letter to be mailed out to her because she doesn't have transportation today to pick it up.   [address] [place] [address]

## 2024-03-10 NOTE — Telephone Encounter (Signed)
 Pt called by Cm and told pt it was ready for pickup.  KP

## 2024-03-16 ENCOUNTER — Encounter (INDEPENDENT_AMBULATORY_CARE_PROVIDER_SITE_OTHER): Admitting: Ophthalmology

## 2024-04-18 ENCOUNTER — Encounter (INDEPENDENT_AMBULATORY_CARE_PROVIDER_SITE_OTHER): Admitting: Ophthalmology

## 2024-04-18 DIAGNOSIS — H43813 Vitreous degeneration, bilateral: Secondary | ICD-10-CM | POA: Diagnosis not present

## 2024-04-18 DIAGNOSIS — Z7984 Long term (current) use of oral hypoglycemic drugs: Secondary | ICD-10-CM | POA: Diagnosis not present

## 2024-04-18 DIAGNOSIS — E113391 Type 2 diabetes mellitus with moderate nonproliferative diabetic retinopathy without macular edema, right eye: Secondary | ICD-10-CM | POA: Diagnosis not present

## 2024-04-18 DIAGNOSIS — E113312 Type 2 diabetes mellitus with moderate nonproliferative diabetic retinopathy with macular edema, left eye: Secondary | ICD-10-CM

## 2024-04-18 DIAGNOSIS — H35033 Hypertensive retinopathy, bilateral: Secondary | ICD-10-CM | POA: Diagnosis not present

## 2024-04-18 DIAGNOSIS — I10 Essential (primary) hypertension: Secondary | ICD-10-CM | POA: Diagnosis not present

## 2024-06-09 ENCOUNTER — Ambulatory Visit: Admitting: Student

## 2024-06-13 ENCOUNTER — Encounter (INDEPENDENT_AMBULATORY_CARE_PROVIDER_SITE_OTHER): Admitting: Ophthalmology
